# Patient Record
Sex: Female | Born: 1955 | Race: White | Hispanic: No | Marital: Married
Health system: Midwestern US, Academic
[De-identification: ages and names within clinical notes are randomized; demographics above are authoritative.]

---

## 2020-01-19 ENCOUNTER — Encounter: Admit: 2020-01-19 | Discharge: 2020-01-19 | Payer: 59

## 2020-01-20 ENCOUNTER — Encounter: Admit: 2020-01-20 | Discharge: 2020-01-20 | Payer: 59

## 2020-01-20 NOTE — Telephone Encounter
Navigation Intake Assessment Document    Patient Name:  Lindsey Brown  DOB:  1956-11-16  Insurance:   [employer] Falling     Appointment Info:    Future Appointments   Date Time Provider Coffee   01/23/2020  2:00 PM Ander Purpura, MD [facility] Exam     Diagnosis & Reason for Visit:  Lindsey Brown is a 64 year old female presenting with leukocytosis and lymphadenopathy in her neck. She had one FNA on a lymph node in her neck that per her was inconclusive so now ENT has scheduled her for an excisional lymph node biopsy at [facility] for next week. She was also treated with a two week course of doxycycline which did not bring down the size of her lymph nodes. She would like a second opinion on if having an excisional lymph node biopsy is the best next step and has not seen a hem/onc yet and would like hem/onc opinion before moving forward. We have requested the bx  results and will forward once received. Labs are attached to the navigation e-mail. Most recent WBC: 28.6, ALC: 57846    Physician Info:   Referring Physician:  Agustin Cree MD    Contact Name & Number:  [facility] [phone]     Comments:  COVID-19 guidelines reviewed with patient, including: visitor and universal masking policies, and a temperature check at the facility entrance upon arrival.

## 2020-01-23 ENCOUNTER — Encounter: Admit: 2020-01-23 | Discharge: 2020-01-23 | Payer: 59

## 2020-01-23 DIAGNOSIS — D72829 Elevated white blood cell count, unspecified: Secondary | ICD-10-CM

## 2020-01-23 DIAGNOSIS — M1990 Unspecified osteoarthritis, unspecified site: Secondary | ICD-10-CM

## 2020-01-23 NOTE — Progress Notes
Subjective:        HEMATOLOGY/ONCOLOGY CONSULTATION REPORT:    CONSULTATION REQUESTED BY: Jason Fila MD     REASON FOR CONSULTATION: Leukocytosis and left cervical lymphadenopathy.    History of Present Illness  Lindsey Brown is a 64 y.o. female.    Lindsey Brown is a 65 year old female presenting with leukocytosis and lymphadenopathy in her neck since 2019. She had one FNA on a lymph node 01/09/2020 in her neck that per her was inconclusive with evidence of predominantly small lymphocytes raising the possibility of a neoplastic process, so ENT had recommended for an excisional lymph node biopsy at [facility]. She was also treated with a two week course of doxycycline which did not bring down the size of her lymph nodes. She requested a second opinion on if having an excisional lymph node biopsy is the best next step and would like hem/onc opinion before moving forward. Labs are attached to the navigation e-mail. WBC in Jan 2021: 28.6, ALC: 09811.  WBC 28.6, hemoglobin 14.4, platelet count 229 and absolute lymphocyte count 22.3. Peripheral smear revealed leukocytosis, lymphocytosis and atypical lymphocytes.    CBC on 01/02/2020 revealed a WBC of 25.9, hemoglobin 14.3 and a platelet count of 266.    No history of fevers chills or night sweats.  No loss of weight or loss of appetite.  No nosebleeds or gum bleeding.  No hematemesis melena or urticaria.  No hemoptysis or hematuria.    Review of old records indicates that she had leukocytosis on 05/12/2018 with a WBC of 12.3, hemoglobin 14.1 and a platelet count of 231.  Absolute lymphocyte count was 6027.    She established with me on 01/23/2020 at [facility] heme-onc clinic.    Family history: Sister has cutaneous T-cell lymphoma.    Medical History:   Diagnosis Date   ? Arthritis      Surgical History:   Procedure Laterality Date   ? COLONOSCOPY       Family History   Problem Relation Age of Onset   ? Arthritis-rheumatoid Mother    ? Cancer Sister ? Heart Disease Sister    ? Thyroid Disease Sister    ? Asthma Son    ? Diabetes Son      Social History     Socioeconomic History   ? Marital status: Married     Spouse name: Not on file   ? Number of children: Not on file   ? Years of education: Not on file   ? Highest education level: Not on file   Occupational History   ? Not on file   Tobacco Use   ? Smoking status: Former Smoker     Quit date: 1988     Years since quitting: 33.1   ? Smokeless tobacco: Never Used   Substance and Sexual Activity   ? Alcohol use: Yes   ? Drug use: Never   ? Sexual activity: Not on file   Other Topics Concern   ? Not on file   Social History Narrative   ? Not on file              Review of Systems   Constitutional: Negative.    HENT: Negative.    Eyes: Negative.    Respiratory: Negative.    Cardiovascular: Negative.    Gastrointestinal: Negative.    Genitourinary: Negative.    Musculoskeletal: Negative.    Skin: Negative.    Neurological: Negative.    Psychiatric/Behavioral:  Negative.          Objective:         ? ALPRAZolam (XANAX) 0.5 mg tablet Take 0.5 mg by mouth daily.     Vitals:    01/23/20 1345   BP: (!) 143/79   BP Source: Arm, Right Upper   Patient Position: Sitting   Pulse: 88   Resp: 18   Temp: 36.6 ?C (97.8 ?F)   TempSrc: Temporal   SpO2: 99%   Weight: 82.1 kg (181 lb)   Height: 173.2 cm (68.19)   PainSc: Zero     Body mass index is 27.37 kg/m?Marland Kitchen     Physical Exam  Constitutional:       Appearance: She is well-developed.   HENT:      Head: Normocephalic and atraumatic.   Neck:      Musculoskeletal: Neck supple.   Cardiovascular:      Rate and Rhythm: Normal rate.      Heart sounds: Normal heart sounds.   Pulmonary:      Effort: Pulmonary effort is normal.      Breath sounds: Normal breath sounds.   Abdominal:      Palpations: Abdomen is soft.      Tenderness: There is no abdominal tenderness.   Musculoskeletal:         General: No tenderness.   Skin:     General: Skin is warm and dry.   Neurological: Mental Status: She is alert and oriented to person, place, and time.       Lymphatics: Left cervical lymph node measuring 2 cm palpable.  No other lymphadenopathy.  No hepatosplenomegaly.     Assessment and Plan:    1.  Leukocytosis with absolute lymphocytosis and left cervical lymphadenopathy since May 2019.    WBC 12.3, hemoglobin 14.1 and platelet count 231 on 05/12/2018.  WBC 28.6, hemoglobin 14.4 and platelet count 229 on 01/12/2020.    She had one FNA on a lymph node 01/09/2020 in her neck that per her was inconclusive with evidence of predominantly small lymphocytes raising the possibility of a neoplastic process, so ENT had recommended for an excisional lymph node biopsy.    I reviewed the CBC results in detail with the patient.  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  I will proceed with a bone marrow biopsy for confirmation of diagnosis and I will also obtain cytogenetics and fish studies for CLL.  Patient was anxious to know the prognosis and treatment options of CLL and I explained in detail and I also discussed the role of chemotherapy in patients with CLL.  They understand the need for bone marrow biopsy for confirmation of diagnosis.    I will consult interventional radiology at [facility] for bone marrow biopsy.  Patient will return for follow-up in about 2 to 3 weeks to review the results.    2.  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

## 2020-02-06 ENCOUNTER — Encounter: Admit: 2020-02-06 | Discharge: 2020-02-06 | Payer: 59

## 2020-02-06 DIAGNOSIS — C9110 Chronic lymphocytic leukemia of B-cell type not having achieved remission: Secondary | ICD-10-CM

## 2020-02-06 DIAGNOSIS — D72829 Elevated white blood cell count, unspecified: Secondary | ICD-10-CM

## 2020-02-06 NOTE — Progress Notes
Subjective:        REASON FOR VISIT: Leukocytosis and left cervical lymphadenopathy due to CLL.    Obtained patient's verbal consent to treat them and their agreement to [facility] financial policy and NPP via this telehealth visit during the [facility] Emergency    History of Present Illness  Lindsey Brown is a 64 y.o. female.    Lindsey Brown is a 64 year old female presenting with leukocytosis and lymphadenopathy in her neck since 2019. She had one FNA on a lymph node 01/09/2020 in her neck that per her was inconclusive with evidence of predominantly small lymphocytes raising the possibility of a neoplastic process, so ENT had recommended for an excisional lymph node biopsy at [facility]. She was also treated with a two week course of doxycycline which did not bring down the size of her lymph nodes. She requested a second opinion on if having an excisional lymph node biopsy is the best next step and would like hem/onc opinion before moving forward. Labs are attached to the navigation e-mail. WBC in Jan 2021: 28.6, ALC: 16109.  WBC 28.6, hemoglobin 14.4, platelet count 229 and absolute lymphocyte count 22.3. Peripheral smear revealed leukocytosis, lymphocytosis and atypical lymphocytes.    CBC on 01/02/2020 revealed a WBC of 25.9, hemoglobin 14.3 and a platelet count of 266.    No history of fevers chills or night sweats.  No loss of weight or loss of appetite.  No nosebleeds or gum bleeding.  No hematemesis melena or urticaria.  No hemoptysis or hematuria.    Review of old records indicates that she had leukocytosis on 05/12/2018 with a WBC of 12.3, hemoglobin 14.1 and a platelet count of 231.  Absolute lymphocyte count was 6027.    She established with me on 01/23/2020 at [facility] heme-onc clinic.    Bone marrow biopsy performed at [facility] on 01/26/2020 reveals hypercellular marrow with trilineage hematopoiesis and 60% involvement by CLL.  CLL FISH panel and I GVH sequencing has been ordered.  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  Cytogenetics revealed a normal karyotype.    Interval History: No fever chills or night sweats.  No chest pain or shortness of breath.  No nausea vomiting.    Family history: Sister has cutaneous T-cell lymphoma.    Medical History:   Diagnosis Date   ? Arthritis      Surgical History:   Procedure Laterality Date   ? COLONOSCOPY       Family History   Problem Relation Age of Onset   ? Arthritis-rheumatoid Mother    ? Cancer Sister    ? Heart Disease Sister    ? Thyroid Disease Sister    ? Asthma Son    ? Diabetes Son      Social History     Socioeconomic History   ? Marital status: Married     Spouse name: Not on file   ? Number of children: Not on file   ? Years of education: Not on file   ? Highest education level: Not on file   Occupational History   ? Not on file   Tobacco Use   ? Smoking status: Former Smoker     Quit date: 1988     Years since quitting: 33.1   ? Smokeless tobacco: Never Used   Substance and Sexual Activity   ? Alcohol use:  Yes   ? Drug use: Never   ? Sexual activity: Not on file   Other Topics Concern   ? Not on file   Social History Narrative   ? Not on file              Review of Systems   Constitutional: Negative.    HENT: Negative.    Eyes: Negative.    Respiratory: Negative.    Cardiovascular: Negative.    Gastrointestinal: Negative.    Genitourinary: Negative.    Musculoskeletal: Negative.    Skin: Negative.    Neurological: Negative.    Psychiatric/Behavioral: Negative.          Objective:         ? ALPRAZolam (XANAX) 0.5 mg tablet Take 0.5 mg by mouth daily.     There were no vitals filed for this visit.  There is no height or weight on file to calculate BMI.       Physical Exam  Constitutional:       Appearance: She is well-developed.   HENT:      Head: Normocephalic and atraumatic.   Pulmonary: Effort: Pulmonary effort is normal.       Neurological:      Mental Status: She is alert and oriented to person, place, and time.            Assessment and Plan:    1.  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    Bone marrow biopsy performed at [facility] on 01/26/2020 reveals hypercellular marrow with trilineage hematopoiesis and 60% involvement by CLL.  CLL FISH panel and I GVH sequencing has been ordered.  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  Cytogenetics revealed a normal karyotype.    Leukocytosis with absolute lymphocytosis and left cervical lymphadenopathy since May 2019.    WBC 12.3, hemoglobin 14.1 and platelet count 231 on 05/12/2018.  WBC 28.6, hemoglobin 14.4 and platelet count 229 on 01/12/2020.    She had one FNA on a lymph node 01/09/2020 in her neck that per her was inconclusive with evidence of predominantly small lymphocytes raising the possibility of a neoplastic process, so ENT had recommended for an excisional lymph node biopsy.    Discussed in detail prognosis and treatment options of CLL and I explained in detail and I also discussed the role of chemotherapy in patients with CLL.      No criteria for chemo at this time.    Await CLL FISH panel and I GVH sequencing.    She will f/u in 2 weeks.    2.  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    I received a note from patient's ENT physician Dr. Aniceto Boss who discussed with the pathologist again to make sure that this lymph node in the neck is not something different from her manifestation of CLL.  The pathologist confirmed that it is unlikely to be 2 different pathologies and hence agreed to cancel excisional biopsy of the left neck lymph node and proceed with bone marrow biopsy.    Problem   Cll (Chronic Lymphocytic Leukemia) (Hcc) 25 minutes spent on this patient's encounter with counseling and coordination of care taking >50% of the visit.

## 2020-02-16 ENCOUNTER — Encounter: Admit: 2020-02-16 | Discharge: 2020-02-16 | Payer: 59

## 2020-02-16 DIAGNOSIS — C9110 Chronic lymphocytic leukemia of B-cell type not having achieved remission: Secondary | ICD-10-CM

## 2020-02-16 NOTE — Progress Notes
Subjective:          REASON FOR VISIT: Leukocytosis and left cervical lymphadenopathy.    History of Present Illness  Lindsey Brown is a 64 y.o. female.    Lindsey Brown is a 64 year old female presenting with leukocytosis and lymphadenopathy in her neck since 2019. She had one FNA on a lymph node 01/09/2020 in her neck that per her was inconclusive with evidence of predominantly small lymphocytes raising the possibility of a neoplastic process, so ENT had recommended for an excisional lymph node biopsy at [facility]. She was also treated with a two week course of doxycycline which did not bring down the size of her lymph nodes. She requested a second opinion on if having an excisional lymph node biopsy is the best next step and would like hem/onc opinion before moving forward. Labs are attached to the navigation e-mail. WBC in Jan 2021: 28.6, ALC: 98119.  WBC 28.6, hemoglobin 14.4, platelet count 229 and absolute lymphocyte count 22.3. Peripheral smear revealed leukocytosis, lymphocytosis and atypical lymphocytes.    CBC on 01/02/2020 revealed a WBC of 25.9, hemoglobin 14.3 and a platelet count of 266.    No history of fevers chills or night sweats.  No loss of weight or loss of appetite.  No nosebleeds or gum bleeding.  No hematemesis melena or urticaria.  No hemoptysis or hematuria.    Review of old records indicates that she had leukocytosis on 05/12/2018 with a WBC of 12.3, hemoglobin 14.1 and a platelet count of 231.  Absolute lymphocyte count was 6027.    Bone marrow biopsy performed at [facility] on 01/26/2020 reveals hypercellular marrow with trilineage hematopoiesis and 60% involvement by CLL.  CLL FISH panel unremarkable, and I GVH sequencing has been ordered.  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  Cytogenetics revealed a normal karyotype. Interval History: No fever chills or night sweats.  No chest pain or shortness of breath.  No nausea vomiting.    She established with me on 01/23/2020 at [facility] heme-onc clinic.    Family history: Sister has cutaneous T-cell lymphoma.    Medical History:   Diagnosis Date   ? Arthritis      Surgical History:   Procedure Laterality Date   ? COLONOSCOPY       Family History   Problem Relation Age of Onset   ? Arthritis-rheumatoid Mother    ? Cancer Sister    ? Heart Disease Sister    ? Thyroid Disease Sister    ? Asthma Son    ? Diabetes Son      Social History     Socioeconomic History   ? Marital status: Married     Spouse name: Not on file   ? Number of children: Not on file   ? Years of education: Not on file   ? Highest education level: Not on file   Occupational History   ? Not on file   Tobacco Use   ? Smoking status: Former Smoker     Quit date: 1988     Years since quitting: 33.1   ? Smokeless tobacco: Never Used   Substance and Sexual Activity   ? Alcohol use: Yes   ? Drug use: Never   ? Sexual activity: Not on file   Other Topics Concern   ? Not on file   Social History Narrative   ?  Not on file              Review of Systems   Constitutional: Negative.    HENT: Negative.    Eyes: Negative.    Respiratory: Negative.    Cardiovascular: Negative.    Gastrointestinal: Negative.    Genitourinary: Negative.    Musculoskeletal: Negative.    Skin: Negative.    Neurological: Negative.    Psychiatric/Behavioral: Negative.          Objective:         ? ALPRAZolam (XANAX) 0.5 mg tablet Take 0.5 mg by mouth daily.     Vitals:    02/16/20 0934   BP: 133/77   BP Source: Arm, Right Upper   Patient Position: Sitting   Pulse: 80   Resp: 18   Temp: 36.8 ?C (98.3 ?F)   TempSrc: Temporal   SpO2: 100%   Weight: 82.6 kg (182 lb)   Height: 173.2 cm (68.19)   PainSc: Zero     Body mass index is 27.52 kg/m?Marland Kitchen     Physical Exam  Constitutional:       Appearance: She is well-developed.   HENT:      Head: Normocephalic and atraumatic. Neck:      Musculoskeletal: Neck supple.   Cardiovascular:      Rate and Rhythm: Normal rate.      Heart sounds: Normal heart sounds.   Pulmonary:      Effort: Pulmonary effort is normal.      Breath sounds: Normal breath sounds.   Abdominal:      Palpations: Abdomen is soft.      Tenderness: There is no abdominal tenderness.   Musculoskeletal:         General: No tenderness.   Skin:     General: Skin is warm and dry.   Neurological:      Mental Status: She is alert and oriented to person, place, and time.       Lymphatics: Left cervical lymph node measuring 2 cm palpable.  No other lymphadenopathy.  No hepatosplenomegaly.     Assessment and Plan:    1.  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    Bone marrow biopsy performed at [facility] on 01/26/2020 reveals hypercellular marrow with trilineage hematopoiesis and 60% involvement by CLL.  CLL FISH panel and I GVH sequencing has been ordered.  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  Cytogenetics revealed a normal karyotype.  Fish studies on the bone marrow is negative for deletion of MYB, ATM, TP53, RB1, 13q14.3, and lamp 1, for trisomy 12, IGH?CCND1 (11;14) fusion, and for IGH rearrangement.  IGVH sequencing pending     Leukocytosis with absolute lymphocytosis and left cervical lymphadenopathy since May 2019.    WBC 12.3, hemoglobin 14.1 and platelet count 231 on 05/12/2018.  WBC 28.6, hemoglobin 14.4 and platelet count 229 on 01/12/2020.  WBC 27.0, hemoglobin 13.9 and platelet count 237 on 01/26/2020.    She had one FNA on a lymph node 01/09/2020 in her neck that per her was inconclusive with evidence of predominantly small lymphocytes raising the possibility of a neoplastic process, so ENT had recommended for an excisional lymph node biopsy.    Discussed in detail prognosis and treatment options of CLL and I explained in detail and I also discussed the role of chemotherapy in patients with CLL.  No criteria for chemo at this time.  Await beta-2 microglobulin levels and IgGVH sequencing for prognostic classification for the CLL IPI score.    I again discussed in detail with the patient and her husband regarding the diagnosis prognosis and indications for treatment.  There is no indications for chemotherapy at this point.  I have recommended labs to be checked every 3 months.    CLL-IPI???The International CLL-IPI working group has proposed an international prognostic index for patients with CLL, which incorporates data regarding disease stage, biology, and patient-related factors. This staging system may help to assign patients with higher or lower risk CLL to particular interventions in clinical trials in order to get homogeneous study groups.  The scoring system was developed and validated using data from [id number] previously untreated patients enrolled on one of eight phase 3 clinical trials completed between 1997 and 2007 [123]. Five factors were identified as independent predictors of short survival on multivariate analysis and assigned a weighted score:  ?Del(17p) or TP53 mutation ? 4 points  ?Serum B2M >3.5 mg/L ? 2 points   ?Unmutated IGHV ? 2 points  ?Rai stage I to IV (table 1) or Binet stage B or C (table 2) ? 1 point  ?Age >65 years ? 1 point  Analysis of these five factors resulted in a final prognostic score, which was able to identify four risk groups with significantly different estimated rates of OS in the internal validation dataset:  ?Low risk (0 to 1 points) ? 91 and 87 percent five- and ten-year OS; median not reached  ?Intermediate risk (2 to 3 points) ? 80 and 40 percent five- and ten-year OS; median 104 months  ?High risk (4 to 6 points) ? 53 and 16 percent five- and ten-year OS; median 63 months  ?Very high risk (7 to 10 points) ? 19 and 0 percent five- and ten-year OS; median 31 months The type of first-line treatment was not identified as an independent factor in this analysis; however, the studies did not include treatment with novel oral inhibitors (eg, idelalisib, ibrutinib, venetoclax), which have demonstrated activity in patients with del(17p) or TP53 mutations. The ability of this scoring system to prognosticate OS and time to first treatment has also been validated in independent cohorts of patients with newly diagnosed and previously untreated patients with CLL       2.  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    I received a note from patient's ENT physician Dr. Aniceto Boss who discussed with the pathologist again to make sure that this lymph node in the neck is not something different from her manifestation of CLL.  The pathologist confirmed that it is unlikely to be 2 different pathologies and hence agreed to cancel excisional biopsy of the left neck lymph node and proceed with bone marrow biopsy.    3.  She had questions about vaccinations.  I advised her that CLL would put her at a higher risk of infections and I would encourage her to stay up-to-date on all the necessary vaccinations including Shingrix vaccine for which she will follow-up with her primary care physician.

## 2020-02-17 LAB — CBC AND DIFF
Lab: 0 % (ref >60)
Lab: 0 10*3/uL (ref 0–0.20)
Lab: 0.1 10*3/uL (ref 0–0.45)
Lab: 0.5 10*3/uL (ref 0–0.80)
Lab: 1 % (ref 0–5)
Lab: 13 % (ref 11–15)
Lab: 14 g/dL — ABNORMAL HIGH (ref 12.0–15.0)
Lab: 15 % — ABNORMAL LOW (ref 41–77)
Lab: 2 % — ABNORMAL LOW (ref 4–12)
Lab: 20 10*3/uL — ABNORMAL HIGH (ref 1.0–4.8)
Lab: 228 10*3/uL (ref 150–400)
Lab: 24 10*3/uL — ABNORMAL HIGH (ref 4.5–11.0)
Lab: 3.8 10*3/uL (ref >60)
Lab: 30 pg (ref 26–34)
Lab: 32 g/dL (ref 32.0–36.0)
Lab: 4.5 M/UL (ref 4.0–5.0)
Lab: 43 % (ref 36–45)
Lab: 8.6 FL (ref 7–11)
Lab: 82 % — ABNORMAL HIGH (ref 24–44)
Lab: 94 FL (ref 80–100)

## 2020-02-17 LAB — COMPREHENSIVE METABOLIC PANEL: Lab: 142 MMOL/L (ref 137–147)

## 2020-02-23 ENCOUNTER — Encounter: Admit: 2020-02-23 | Discharge: 2020-02-23 | Payer: 59

## 2020-02-23 DIAGNOSIS — C9110 Chronic lymphocytic leukemia of B-cell type not having achieved remission: Secondary | ICD-10-CM

## 2020-02-23 NOTE — Progress Notes
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    Obtained patient's verbal consent to treat them and their agreement to [facility] financial policy and NPP via this telehealth visit during the [facility]    History of Present Illness  Lindsey Brown is a 64 y.o. female.    Lindsey Brown is a 64 year old female presenting with leukocytosis and lymphadenopathy in her neck since 2019. She had one FNA on a lymph node 01/09/2020 in her neck that per her was inconclusive with evidence of predominantly small lymphocytes raising the possibility of a neoplastic process, so ENT had recommended for an excisional lymph node biopsy at [facility]. She was also treated with a two week course of doxycycline which did not bring down the size of her lymph nodes. She requested a second opinion on if having an excisional lymph node biopsy is the best next step and would like hem/onc opinion before moving forward. Labs are attached to the navigation e-mail. WBC in Jan 2021: 28.6, ALC: 64403.  WBC 28.6, hemoglobin 14.4, platelet count 229 and absolute lymphocyte count 22.3. Peripheral smear revealed leukocytosis, lymphocytosis and atypical lymphocytes.    CBC on 01/02/2020 revealed a WBC of 25.9, hemoglobin 14.3 and a platelet count of 266.    No history of fevers chills or night sweats.  No loss of weight or loss of appetite.  No nosebleeds or gum bleeding.  No hematemesis melena or urticaria.  No hemoptysis or hematuria.    Review of old records indicates that she had leukocytosis on 05/12/2018 with a WBC of 12.3, hemoglobin 14.1 and a platelet count of 231.  Absolute lymphocyte count was 6027.    Bone marrow biopsy performed at [facility] on 01/26/2020 reveals hypercellular marrow with trilineage hematopoiesis and 60% involvement by CLL.  CLL FISH panel unremarkable, and IGVH mutated.  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  Cytogenetics revealed a normal karyotype.    Interval History: No fever chills or night sweats.  No chest pain or shortness of breath.  No nausea vomiting.    She established with me on 01/23/2020 at [facility] heme-onc clinic.    Family history: Sister has cutaneous T-cell lymphoma.    Medical History:   Diagnosis Date   ? Arthritis      Surgical History:   Procedure Laterality Date   ? COLONOSCOPY       Family History   Problem Relation Age of Onset   ? Arthritis-rheumatoid Mother    ? Cancer Sister    ? Heart Disease Sister    ? Thyroid Disease Sister    ? Asthma Son    ? Diabetes Son      Social History     Socioeconomic History   ? Marital status: Married     Spouse name: Not on file   ? Number of children: Not on file   ? Years of education: Not on file   ? Highest education level: Not on file   Occupational History   ? Not on file   Tobacco Use   ? Smoking status: Former Smoker     Quit date: 1988     Years since quitting: 33.1   ? Smokeless tobacco: Never Used   Substance and Sexual Activity   ? Alcohol use: Yes   ?  Drug use: Never   ? Sexual activity: Not on file   Other Topics Concern   ? Not on file   Social History Narrative   ? Not on file              Review of Systems   Constitutional: Negative.    HENT: Negative.    Eyes: Negative.    Respiratory: Negative.    Cardiovascular: Negative.    Gastrointestinal: Negative.    Genitourinary: Negative.    Musculoskeletal: Negative.    Skin: Negative.    Neurological: Negative.    Psychiatric/Behavioral: Negative.          Objective:         ? ALPRAZolam (XANAX) 0.5 mg tablet Take 0.5 mg by mouth daily.     There were no vitals filed for this visit.  There is no height or weight on file to calculate BMI.     Physical Exam  Constitutional:       Appearance: She is well-developed.   HENT:      Head: Normocephalic and atraumatic.   Neck: Musculoskeletal: Neck supple.   Cardiovascular:      Rate and Rhythm: Normal rate.      Heart sounds: Normal heart sounds.   Pulmonary:      Effort: Pulmonary effort is normal.      Breath sounds: Normal breath sounds.   Abdominal:      Palpations: Abdomen is soft.      Tenderness: There is no abdominal tenderness.   Musculoskeletal:         General: No tenderness.   Skin:     General: Skin is warm and dry.   Neurological:      Mental Status: She is alert and oriented to person, place, and time.       Lymphatics: Left cervical lymph node measuring 2 cm palpable.  No other lymphadenopathy.  No hepatosplenomegaly.     Assessment and Plan:    1.  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  CLL-IPI Low risk: 91 and 87 percent five- and ten-year OS; median not reached.    Bone marrow biopsy performed at [facility] on 01/26/2020 reveals hypercellular marrow with trilineage hematopoiesis and 60% involvement by CLL.  CLL FISH panel and I GVH sequencing has been ordered.  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  Cytogenetics revealed a normal karyotype.  Fish studies on the bone marrow is negative for deletion of MYB, ATM, TP53, RB1, 13q14.3, and lamp 1, for trisomy 12, IGH?CCND1 (11;14) fusion, and for IGH rearrangement.  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  B2 microglobulin 2.5 on 02/16/2020.     Leukocytosis with absolute lymphocytosis and left cervical lymphadenopathy since May 2019.    WBC 12.3, hemoglobin 14.1 and platelet count 231 on 05/12/2018.  WBC 28.6, hemoglobin 14.4 and platelet count 229 on 01/12/2020.  WBC 27.0, hemoglobin 13.9 and platelet count 237 on 01/26/2020.  WBC 24.6 on 02/16/2020.  Hemoglobin 14.1 and platelet count 228.    She had one FNA on a lymph node 01/09/2020 in her neck that per her was inconclusive with evidence of predominantly small lymphocytes raising the possibility of a neoplastic process, so ENT had recommended for an excisional lymph node biopsy.    Discussed in detail prognosis and treatment options of CLL and I explained in detail and I also discussed the role  of chemotherapy in patients with CLL.      CLL-IPI???The International CLL-IPI working group has proposed an international prognostic index for patients with CLL, which incorporates data regarding disease stage, biology, and patient-related factors. This staging system may help to assign patients with higher or lower risk CLL to particular interventions in clinical trials in order to get homogeneous study groups.  The scoring system was developed and validated using data from [id number] previously untreated patients enrolled on one of eight phase 3 clinical trials completed between 1997 and 2007 [123]. Five factors were identified as independent predictors of short survival on multivariate analysis and assigned a weighted score:  ?Del(17p) or TP53 mutation ? 4 points  ?Serum B2M >3.5 mg/L ? 2 points   ?Unmutated IGHV ? 2 points  ?Rai stage I to IV (table 1) or Binet stage B or C (table 2) ? 1 point  ?Age >65 years ? 1 point  Analysis of these five factors resulted in a final prognostic score, which was able to identify four risk groups with significantly different estimated rates of OS in the internal validation dataset:  ?Low risk (0 to 1 points) ? 91 and 87 percent five- and ten-year OS; median not reached  ?Intermediate risk (2 to 3 points) ? 80 and 40 percent five- and ten-year OS; median 104 months  ?High risk (4 to 6 points) ? 53 and 16 percent five- and ten-year OS; median 63 months  ?Very high risk (7 to 10 points) ? 19 and 0 percent five- and ten-year OS; median 31 months  The type of first-line treatment was not identified as an independent factor in this analysis; however, the studies did not include treatment with novel oral inhibitors (eg, idelalisib, ibrutinib, venetoclax), which have demonstrated activity in patients with del(17p) or TP53 mutations. The ability of this scoring system to prognosticate OS and time to first treatment has also been validated in independent cohorts of patients with newly diagnosed and previously untreated patients with CLL     I reviewed the CLL?IPI prognostic group and I also discussed the survival data as above.  She falls in the low risk category with excellent prognosis.  I also reviewed the criteria for treatment and there is no indication for treatment at this point.  Plan for follow-up in 3 months.    2.  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    I received a note from patient's ENT physician Dr. Aniceto Boss who discussed with the pathologist again to make sure that this lymph node in the neck is not something different from her manifestation of CLL.  The pathologist confirmed that it is unlikely to be 2 different pathologies and hence agreed to cancel excisional biopsy of the left neck lymph node and proceed with bone marrow biopsy.    3.  She had questions about vaccinations.  I advised her that CLL would put her at a higher risk of infections and I would encourage her to stay up-to-date on all the necessary vaccinations including Shingrix vaccine for which she will follow-up with her primary care physician.    CBC w diff  CBC with Diff Latest Ref Rng & Units 02/16/2020   WBC 4.5 - 11.0 K/UL 24.6(H)   RBC 4.0 - 5.0 M/UL 4.57   HGB 12.0 - 15.0 GM/DL 62.9   HCT 36 - 45 % 52.8   MCV 80 - 100 FL 94.2   MCH 26 - 34 PG 30.9   MCHC 32.0 -  36.0 G/DL 16.1   RDW 11 - 15 % 09.6   PLT 150 - 400 K/UL 228   MPV 7 - 11 FL 8.6   NEUT 41 - 77 % 15(L)   ANC 1.8 - 7.0 K/UL 3.80   LYMA 24 - 44 % 82(H)   ALYM 1.0 - 4.8 K/UL 20.04(H)   MONA 4 - 12 % 2(L)   AMONO 0 - 0.80 K/UL 0.55   EOSA 0 - 5 % 1   AEOS 0 - 0.45 K/UL 0.17   BASA 0 - 2 % 0   ABAS 0 - 0.20 K/UL 0.06                            25 min

## 2020-05-10 ENCOUNTER — Encounter: Admit: 2020-05-10 | Discharge: 2020-05-10 | Payer: 59

## 2020-05-10 DIAGNOSIS — C9110 Chronic lymphocytic leukemia of B-cell type not having achieved remission: Secondary | ICD-10-CM

## 2020-05-10 LAB — CBC AND DIFF

## 2020-05-17 ENCOUNTER — Encounter: Admit: 2020-05-17 | Discharge: 2020-05-17 | Payer: 59

## 2020-05-17 DIAGNOSIS — C9110 Chronic lymphocytic leukemia of B-cell type not having achieved remission: Secondary | ICD-10-CM

## 2020-05-17 DIAGNOSIS — M1990 Unspecified osteoarthritis, unspecified site: Secondary | ICD-10-CM

## 2020-05-17 NOTE — Progress Notes
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    History of Present Illness  Lindsey Brown is a 64 y.o. female.    Lindsey Brown is a 64 year old female presenting with leukocytosis and lymphadenopathy in her neck since 2019. She had one FNA on a lymph node 01/09/2020 in her neck that per her was inconclusive with evidence of predominantly small lymphocytes raising the possibility of a neoplastic process, so ENT had recommended for an excisional lymph node biopsy at [facility]. She was also treated with a two week course of doxycycline which did not bring down the size of her lymph nodes. She requested a second opinion on if having an excisional lymph node biopsy is the best next step and would like hem/onc opinion before moving forward. Labs are attached to the navigation e-mail. WBC in Jan 2021: 28.6, ALC: 16109.  WBC 28.6, hemoglobin 14.4, platelet count 229 and absolute lymphocyte count 22.3. Peripheral smear revealed leukocytosis, lymphocytosis and atypical lymphocytes.    CBC on 01/02/2020 revealed a WBC of 25.9, hemoglobin 14.3 and a platelet count of 266.    No history of fevers chills or night sweats.  No loss of weight or loss of appetite.  No nosebleeds or gum bleeding.  No hematemesis melena or urticaria.  No hemoptysis or hematuria.    Review of old records indicates that she had leukocytosis on 05/12/2018 with a WBC of 12.3, hemoglobin 14.1 and a platelet count of 231.  Absolute lymphocyte count was 6027.    Bone marrow biopsy performed at [facility] on 01/26/2020 reveals hypercellular marrow with trilineage hematopoiesis and 60% involvement by CLL.  CLL FISH panel unremarkable, and I GVH sequencing has been ordered.  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  Cytogenetics revealed a normal karyotype. Interval History: No fever chills or night sweats.  No chest pain or shortness of breath.  No nausea vomiting.    She established with me on 01/23/2020 at [facility] heme-onc clinic.    Family history: Sister has cutaneous T-cell lymphoma.    Medical History:   Diagnosis Date   ? Arthritis      Surgical History:   Procedure Laterality Date   ? COLONOSCOPY       Family History   Problem Relation Age of Onset   ? Arthritis-rheumatoid Mother    ? Cancer Sister    ? Heart Disease Sister    ? Thyroid Disease Sister    ? Asthma Son    ? Diabetes Son      Social History     Socioeconomic History   ? Marital status: Married     Spouse name: Not on file   ? Number of children: Not on file   ? Years of education: Not on file   ? Highest education level: Not on file   Occupational History   ? Not on file   Tobacco Use   ? Smoking status: Former Smoker     Quit date: 1988     Years since quitting: 33.4   ? Smokeless tobacco: Never Used   Substance and Sexual Activity   ? Alcohol use: Yes   ? Drug use: Never   ? Sexual activity: Not on file   Other Topics Concern   ? Not on file   Social  History Narrative   ? Not on file              Review of Systems   Constitutional: Negative.    HENT: Negative.    Eyes: Negative.    Respiratory: Negative.    Cardiovascular: Negative.    Gastrointestinal: Negative.    Genitourinary: Negative.    Musculoskeletal: Negative.    Skin: Negative.    Neurological: Negative.    Psychiatric/Behavioral: Negative.          Objective:         ? ALPRAZolam (XANAX) 0.5 mg tablet Take 0.5 mg by mouth daily.     Vitals:    05/17/20 1300   BP: 138/81   BP Source: Arm, Right Upper   Patient Position: Sitting   Pulse: 78   Resp: 18   Temp: 36.4 ?C (97.5 ?F)   TempSrc: Temporal   SpO2: 98%   Weight: 78.6 kg (173 lb 3.2 oz)   Height: 173 cm (68.11)   PainSc: Zero     Body mass index is 26.25 kg/m?Marland Kitchen     Physical Exam  Constitutional:       Appearance: She is well-developed.   HENT:      Head: Normocephalic and atraumatic. Cardiovascular:      Rate and Rhythm: Normal rate.      Heart sounds: Normal heart sounds.   Pulmonary:      Effort: Pulmonary effort is normal.      Breath sounds: Normal breath sounds.   Abdominal:      Palpations: Abdomen is soft.      Tenderness: There is no abdominal tenderness.   Musculoskeletal:         General: No tenderness.      Cervical back: Neck supple.   Skin:     General: Skin is warm and dry.   Neurological:      Mental Status: She is alert and oriented to person, place, and time.       Lymphatics: Left cervical lymph node measuring 2 cm palpable.  No other lymphadenopathy.  No hepatosplenomegaly.     Assessment and Plan:    1.  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  CLL-IPI Low risk: 91 and 87 percent five- and ten-year OS; median not reached.    Bone marrow biopsy performed at [facility] on 01/26/2020 reveals hypercellular marrow with trilineage hematopoiesis and 60% involvement by CLL.  CLL FISH panel and I GVH sequencing has been ordered.  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  Cytogenetics revealed a normal karyotype.  Fish studies on the bone marrow is negative for deletion of MYB, ATM, TP53, RB1, 13q14.3, and lamp 1, for trisomy 12, IGH?CCND1 (11;14) fusion, and for IGH rearrangement.  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  B2 microglobulin 2.5 on 02/16/2020.     Leukocytosis with absolute lymphocytosis and left cervical lymphadenopathy since May 2019.    WBC 12.3, hemoglobin 14.1 and platelet count 231 on 05/12/2018.  WBC 28.6, hemoglobin 14.4 and platelet count 229 on 01/12/2020.  WBC 27.0, hemoglobin 13.9 and platelet count 237 on 01/26/2020.  WBC 24.6 on 02/16/2020.  Hemoglobin 14.1 and platelet count 228.  CBC on 05/10/2020 reveals hemoglobin 13.6, WBC 27.4 and platelet count of 191.  Absolute lymphocyte count is 19.2.    She had one FNA on a lymph node 01/09/2020 in her neck that  per her was inconclusive with evidence of predominantly small lymphocytes raising the possibility of a neoplastic process, so ENT had recommended for an excisional lymph node biopsy.    Discussed in detail prognosis and treatment options of CLL and I explained in detail and I also discussed the role of chemotherapy in patients with CLL.      CLL-IPI???The International CLL-IPI working group has proposed an international prognostic index for patients with CLL, which incorporates data regarding disease stage, biology, and patient-related factors. This staging system may help to assign patients with higher or lower risk CLL to particular interventions in clinical trials in order to get homogeneous study groups.  The scoring system was developed and validated using data from [id number] previously untreated patients enrolled on one of eight phase 3 clinical trials completed between 1997 and 2007 [123]. Five factors were identified as independent predictors of short survival on multivariate analysis and assigned a weighted score:  ?Del(17p) or TP53 mutation ? 4 points  ?Serum B2M >3.5 mg/L ? 2 points   ?Unmutated IGHV ? 2 points  ?Rai stage I to IV (table 1) or Binet stage B or C (table 2) ? 1 point  ?Age >65 years ? 1 point  Analysis of these five factors resulted in a final prognostic score, which was able to identify four risk groups with significantly different estimated rates of OS in the internal validation dataset:  ?Low risk (0 to 1 points) ? 91 and 87 percent five- and ten-year OS; median not reached  ?Intermediate risk (2 to 3 points) ? 80 and 40 percent five- and ten-year OS; median 104 months  ?High risk (4 to 6 points) ? 53 and 16 percent five- and ten-year OS; median 63 months  ?Very high risk (7 to 10 points) ? 19 and 0 percent five- and ten-year OS; median 31 months  The type of first-line treatment was not identified as an independent factor in this analysis; however, the studies did not include treatment with novel oral inhibitors (eg, idelalisib, ibrutinib, venetoclax), which have demonstrated activity in patients with del(17p) or TP53 mutations. The ability of this scoring system to prognosticate OS and time to first treatment has also been validated in independent cohorts of patients with newly diagnosed and previously untreated patients with CLL     I reviewed the CLL?IPI prognostic group and I also discussed the survival data as above.  She falls in the low risk category with excellent prognosis.  I also reviewed the criteria for treatment and there is no indication for treatment at this point.      Labs from 05/10/2020 revealed that the WBC is slightly worse.  Hemoglobin and platelet counts are normal.  She does not meet the criteria for treatment.  Continue to monitor.  I will check CBC in 3 months.    2.  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    I received a note from patient's ENT physician Dr. Aniceto Boss who discussed with the pathologist again to make sure that this lymph node in the neck is not something different from her manifestation of CLL.  The pathologist confirmed that it is unlikely to be 2 different pathologies and hence agreed to cancel excisional biopsy of the left neck lymph node and proceed with bone marrow biopsy.    3.  She had questions about vaccinations.  I advised her that CLL would put her at a higher risk of infections and I would encourage her to stay up-to-date  on all the necessary vaccinations including Shingrix vaccine for which she will follow-up with her primary care physician.    CBC w diff  CBC with Diff Latest Ref Rng & Units 02/16/2020   WBC 4.5 - 11.0 K/UL 24.6(H)   RBC 4.0 - 5.0 M/UL 4.57   HGB 12.0 - 15.0 GM/DL 04.5   HCT 36 - 45 % 40.9   MCV 80 - 100 FL 94.2   MCH 26 - 34 PG 30.9   MCHC 32.0 - 36.0 G/DL 81.1   RDW 11 - 15 % 91.4   PLT 150 - 400 K/UL 228   MPV 7 - 11 FL 8.6   NEUT 41 - 77 % 15(L)   ANC 1.8 - 7.0 K/UL 3.80   LYMA 24 - 44 % 82(H)   ALYM 1.0 - 4.8 K/UL 20.04(H)   MONA 4 - 12 % 2(L)   AMONO 0 - 0.80 K/UL 0.55   EOSA 0 - 5 % 1   AEOS 0 - 0.45 K/UL 0.17   BASA 0 - 2 % 0   ABAS 0 - 0.20 K/UL 0.06

## 2020-08-02 ENCOUNTER — Encounter: Admit: 2020-08-02 | Discharge: 2020-08-02 | Payer: 59

## 2020-08-02 DIAGNOSIS — C9110 Chronic lymphocytic leukemia of B-cell type not having achieved remission: Secondary | ICD-10-CM

## 2020-08-09 ENCOUNTER — Encounter: Admit: 2020-08-09 | Discharge: 2020-08-09 | Payer: 59

## 2020-08-09 DIAGNOSIS — C9110 Chronic lymphocytic leukemia of B-cell type not having achieved remission: Secondary | ICD-10-CM

## 2020-08-10 LAB — CBC AND DIFF
Lab: 0 % (ref 0–2)
Lab: 0 10*3/uL (ref 0–0.20)
Lab: 0.1 10*3/uL (ref 0–0.45)
Lab: 0.6 10*3/uL (ref 0–0.80)
Lab: 1 % — ABNORMAL HIGH (ref 0–5)
Lab: 13 % (ref 11–15)
Lab: 14 g/dL (ref 12.0–15.0)
Lab: 15 % — ABNORMAL LOW (ref 41–77)
Lab: 195 K/UL (ref 150–400)
Lab: 2 % — ABNORMAL LOW (ref >60)
Lab: 22 K/UL — ABNORMAL HIGH (ref 1.0–4.8)
Lab: 27 10*3/uL — ABNORMAL HIGH (ref 4.5–11.0)
Lab: 32 pg (ref 26–34)
Lab: 33 g/dL (ref 32.0–36.0)
Lab: 4 K/UL (ref 1.8–7.0)
Lab: 4.3 M/UL (ref 4.0–5.0)
Lab: 41 % (ref 36–45)
Lab: 8.7 FL (ref 7–11)
Lab: 82 % — ABNORMAL HIGH (ref >60)
Lab: 95 FL (ref 80–100)

## 2020-08-16 ENCOUNTER — Encounter: Admit: 2020-08-16 | Discharge: 2020-08-16 | Payer: 59

## 2020-08-16 DIAGNOSIS — M1990 Unspecified osteoarthritis, unspecified site: Secondary | ICD-10-CM

## 2020-08-16 DIAGNOSIS — C9110 Chronic lymphocytic leukemia of B-cell type not having achieved remission: Secondary | ICD-10-CM

## 2020-08-16 NOTE — Progress Notes
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    History of Present Illness  Lindsey Brown is a 64 y.o. female.    Lindsey Brown is a 64 year old female presenting with leukocytosis and lymphadenopathy in her neck since 2019. She had one FNA on a lymph node 01/09/2020 in her neck that per her was inconclusive with evidence of predominantly small lymphocytes raising the possibility of a neoplastic process, so ENT had recommended for an excisional lymph node biopsy at [facility]. She was also treated with a two week course of doxycycline which did not bring down the size of her lymph nodes. She requested a second opinion on if having an excisional lymph node biopsy is the best next step and would like hem/onc opinion before moving forward. Labs are attached to the navigation e-mail. WBC in Jan 2021: 28.6, ALC: 16109.  WBC 28.6, hemoglobin 14.4, platelet count 229 and absolute lymphocyte count 22.3. Peripheral smear revealed leukocytosis, lymphocytosis and atypical lymphocytes.    CBC on 01/02/2020 revealed a WBC of 25.9, hemoglobin 14.3 and a platelet count of 266.    No history of fevers chills or night sweats.  No loss of weight or loss of appetite.  No nosebleeds or gum bleeding.  No hematemesis melena or urticaria.  No hemoptysis or hematuria.    Review of old records indicates that she had leukocytosis on 05/12/2018 with a WBC of 12.3, hemoglobin 14.1 and a platelet count of 231.  Absolute lymphocyte count was 6027.    Bone marrow biopsy performed at [facility] on 01/26/2020 reveals hypercellular marrow with trilineage hematopoiesis and 60% involvement by CLL.  CLL FISH panel unremarkable, and I GVH sequencing has been ordered.  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  Cytogenetics revealed a normal karyotype.    Interval History: No fever chills or night sweats.  No chest pain or shortness of breath.  No nausea vomiting.    She established with me on 01/23/2020 at [facility] heme-onc clinic.    Family history: Sister has cutaneous T-cell lymphoma.    Medical History:   Diagnosis Date   ? Arthritis      Surgical History:   Procedure Laterality Date   ? COLONOSCOPY       Family History   Problem Relation Age of Onset   ? Arthritis-rheumatoid Mother    ? Cancer Sister    ? Heart Disease Sister    ? Thyroid Disease Sister    ? Asthma Son    ? Diabetes Son      Social History     Socioeconomic History   ? Marital status: Married     Spouse name: Not on file   ? Number of children: Not on file   ? Years of education: Not on file   ? Highest education level: Not on file   Occupational History   ? Not on file   Tobacco Use   ? Smoking status: Former Smoker     Quit date: 1988     Years since quitting: 33.6   ? Smokeless tobacco: Never Used   Substance and Sexual Activity   ? Alcohol use: Yes   ? Drug use: Never   ? Sexual activity: Not on file   Other Topics Concern   ? Not on file  Social History Narrative   ? Not on file              Review of Systems   Constitutional: Negative.    HENT: Negative.    Eyes: Negative.    Respiratory: Negative.    Cardiovascular: Negative.    Gastrointestinal: Negative.    Genitourinary: Negative.    Musculoskeletal: Negative.    Skin: Negative.    Neurological: Negative.    Psychiatric/Behavioral: Negative.          Objective:         ? ALPRAZolam (XANAX) 0.5 mg tablet Take 0.5 mg by mouth daily.     Vitals:    08/16/20 1445   BP: 127/75   BP Source: Arm, Right Upper   Patient Position: Sitting   Pulse: 85   Resp: 18   Temp: 36.3 ?C (97.3 ?F)   TempSrc: Temporal   SpO2: 96%   Weight: 78.8 kg (173 lb 12.8 oz)   Height: 173 cm (68.11)   PainSc: Zero     Body mass index is 26.34 kg/m?Marland Kitchen     Physical Exam  Constitutional:       Appearance: She is well-developed.   HENT:      Head: Normocephalic and atraumatic.   Cardiovascular:      Rate and Rhythm: Normal rate.      Heart sounds: Normal heart sounds.   Pulmonary:      Effort: Pulmonary effort is normal.      Breath sounds: Normal breath sounds.   Abdominal:      Palpations: Abdomen is soft.      Tenderness: There is no abdominal tenderness.   Musculoskeletal:         General: No tenderness.      Cervical back: Neck supple.   Skin:     General: Skin is warm and dry.   Neurological:      Mental Status: She is alert and oriented to person, place, and time.       Lymphatics: Left cervical lymph node measuring 2 cm palpable.  No other lymphadenopathy.  No hepatosplenomegaly.     Assessment and Plan:    1.  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  CLL-IPI Low risk: 91 and 87 percent five- and ten-year OS; median not reached.    Bone marrow biopsy performed at [facility] on 01/26/2020 reveals hypercellular marrow with trilineage hematopoiesis and 60% involvement by CLL.  CLL FISH panel and I GVH sequencing has been ordered.  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  Cytogenetics revealed a normal karyotype.  Fish studies on the bone marrow is negative for deletion of MYB, ATM, TP53, RB1, 13q14.3, and lamp 1, for trisomy 12, IGH?CCND1 (11;14) fusion, and for IGH rearrangement.  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  B2 microglobulin 2.5 on 02/16/2020.     Leukocytosis with absolute lymphocytosis and left cervical lymphadenopathy since May 2019.    WBC 12.3, hemoglobin 14.1 and platelet count 231 on 05/12/2018.  WBC 28.6, hemoglobin 14.4 and platelet count 229 on 01/12/2020.  WBC 27.0, hemoglobin 13.9 and platelet count 237 on 01/26/2020.  WBC 24.6 on 02/16/2020.  Hemoglobin 14.1 and platelet count 228.  CBC on 05/10/2020 reveals hemoglobin 13.6, WBC 27.4 and platelet count of 191.  Absolute lymphocyte count is 19.2.    She had one FNA on a lymph node 01/09/2020 in  her neck that per her was inconclusive with evidence of predominantly small lymphocytes raising the possibility of a neoplastic process, so ENT had recommended for an excisional lymph node biopsy.    Discussed in detail prognosis and treatment options of CLL and I explained in detail and I also discussed the role of chemotherapy in patients with CLL.      CLL-IPI???The International CLL-IPI working group has proposed an international prognostic index for patients with CLL, which incorporates data regarding disease stage, biology, and patient-related factors. This staging system may help to assign patients with higher or lower risk CLL to particular interventions in clinical trials in order to get homogeneous study groups.  The scoring system was developed and validated using data from [id number] previously untreated patients enrolled on one of eight phase 3 clinical trials completed between 1997 and 2007 [123]. Five factors were identified as independent predictors of short survival on multivariate analysis and assigned a weighted score:  ?Del(17p) or TP53 mutation ? 4 points  ?Serum B2M >3.5 mg/L ? 2 points   ?Unmutated IGHV ? 2 points  ?Rai stage I to IV (table 1) or Binet stage B or C (table 2) ? 1 point  ?Age >65 years ? 1 point  Analysis of these five factors resulted in a final prognostic score, which was able to identify four risk groups with significantly different estimated rates of OS in the internal validation dataset:  ?Low risk (0 to 1 points) ? 91 and 87 percent five- and ten-year OS; median not reached  ?Intermediate risk (2 to 3 points) ? 80 and 40 percent five- and ten-year OS; median 104 months  ?High risk (4 to 6 points) ? 53 and 16 percent five- and ten-year OS; median 63 months  ?Very high risk (7 to 10 points) ? 19 and 0 percent five- and ten-year OS; median 31 months  The type of first-line treatment was not identified as an independent factor in this analysis; however, the studies did not include treatment with novel oral inhibitors (eg, idelalisib, ibrutinib, venetoclax), which have demonstrated activity in patients with del(17p) or TP53 mutations. The ability of this scoring system to prognosticate OS and time to first treatment has also been validated in independent cohorts of patients with newly diagnosed and previously untreated patients with CLL     I reviewed the CLL?IPI prognostic group and I also discussed the survival data as above.  She falls in the low risk category with excellent prognosis.  I also reviewed the criteria for treatment and there is no indication for treatment at this point.      Labs from 05/10/2020 revealed that the WBC is slightly worse.  Hemoglobin and platelet counts are normal.  She does not meet the criteria for treatment.  Continue to monitor.  I will check CBC in 3 months.  WBC worse at 27.9 on 08/09/2020.  Hemoglobin 14.1 and platelet count 195.  Plan for follow-up CBC in 3 months.    2.  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    I received a note from patient's ENT physician Dr. Aniceto Boss who discussed with the pathologist again to make sure that this lymph node in the neck is not something different from her manifestation of CLL.  The pathologist confirmed that it is unlikely to be 2 different pathologies and hence agreed to cancel excisional biopsy of the left neck lymph node and proceed with bone marrow biopsy.    3.  She had questions  about vaccinations.  I advised her that CLL would put her at a higher risk of infections and I would encourage her to stay up-to-date on all the necessary vaccinations including Shingrix vaccine for which she will follow-up with her primary care physician.    CBC w diff  CBC with Diff Latest Ref Rng & Units 08/09/2020 02/16/2020   WBC 4.5 - 11.0 K/UL 27.9(H) 24.6(H)   RBC 4.0 - 5.0 M/UL 4.38 4.57   HGB 12.0 - 15.0 GM/DL 57.8 46.9   HCT 36 - 45 % 41.7 43.1   MCV 80 - 100 FL 95.3 94.2   MCH 26 - 34 PG 32.2 30.9   MCHC 32.0 - 36.0 G/DL 62.9 52.8   RDW 11 - 15 % 13.7 13.7   PLT 150 - 400 K/UL 195 228   MPV 7 - 11 FL 8.7 8.6   NEUT 41 - 77 % 15(L) 15(L)   ANC 1.8 - 7.0 K/UL 4.09 3.80   LYMA 24 - 44 % 82(H) 82(H)   ALYM 1.0 - 4.8 K/UL 22.99(H) 20.04(H)   MONA 4 - 12 % 2(L) 2(L)   AMONO 0 - 0.80 K/UL 0.62 0.55   EOSA 0 - 5 % 1 1   AEOS 0 - 0.45 K/UL 0.13 0.17   BASA 0 - 2 % 0 0   ABAS 0 - 0.20 K/UL 0.07 0.06

## 2020-08-21 ENCOUNTER — Encounter: Admit: 2020-08-21 | Discharge: 2020-08-21 | Payer: 59

## 2020-08-21 DIAGNOSIS — Z23 Encounter for immunization: Secondary | ICD-10-CM

## 2020-11-12 ENCOUNTER — Encounter: Admit: 2020-11-12 | Discharge: 2020-11-12 | Payer: 59

## 2020-11-12 DIAGNOSIS — C9110 Chronic lymphocytic leukemia of B-cell type not having achieved remission: Secondary | ICD-10-CM

## 2020-11-12 LAB — CBC AND DIFF
Lab: 13 % (ref 11–15)
Lab: 14 g/dL (ref 12.0–15.0)
Lab: 30 pg (ref 26–34)
Lab: 32 g/dL (ref 32.0–36.0)
Lab: 34 K/UL — ABNORMAL HIGH (ref 4.5–11.0)
Lab: 4.6 M/UL (ref 4.0–5.0)
Lab: 44 % (ref 36–45)
Lab: 96 FL (ref 80–100)

## 2020-11-19 ENCOUNTER — Encounter: Admit: 2020-11-19 | Discharge: 2020-11-19 | Payer: 59

## 2020-11-19 DIAGNOSIS — C9110 Chronic lymphocytic leukemia of B-cell type not having achieved remission: Secondary | ICD-10-CM

## 2020-11-19 DIAGNOSIS — M1990 Unspecified osteoarthritis, unspecified site: Secondary | ICD-10-CM

## 2020-11-19 NOTE — Progress Notes
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    History of Present Illness  Lindsey Brown is a 64 y.o. female.    Lindsey Brown is a 64 year old female presenting with leukocytosis and lymphadenopathy in her neck since 2019. She had one FNA on a lymph node 01/09/2020 in her neck that per her was inconclusive with evidence of predominantly small lymphocytes raising the possibility of a neoplastic process, so ENT had recommended for an excisional lymph node biopsy at [facility]. She was also treated with a two week course of doxycycline which did not bring down the size of her lymph nodes. She requested a second opinion on if having an excisional lymph node biopsy is the best next step and would like hem/onc opinion before moving forward. Labs are attached to the navigation e-mail. WBC in Jan 2021: 28.6, ALC: 16109.  WBC 28.6, hemoglobin 14.4, platelet count 229 and absolute lymphocyte count 22.3. Peripheral smear revealed leukocytosis, lymphocytosis and atypical lymphocytes.    CBC on 01/02/2020 revealed a WBC of 25.9, hemoglobin 14.3 and a platelet count of 266.    No history of fevers chills or night sweats.  No loss of weight or loss of appetite.  No nosebleeds or gum bleeding.  No hematemesis melena or urticaria.  No hemoptysis or hematuria.    Review of old records indicates that she had leukocytosis on 05/12/2018 with a WBC of 12.3, hemoglobin 14.1 and a platelet count of 231.  Absolute lymphocyte count was 6027.    Bone marrow biopsy performed at [facility] on 01/26/2020 reveals hypercellular marrow with trilineage hematopoiesis and 60% involvement by CLL.  CLL FISH panel unremarkable, and I GVH sequencing has been ordered.  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  Cytogenetics revealed a normal karyotype.    Interval History: No fever chills or night sweats.  No chest pain or shortness of breath.  No nausea vomiting.    She established with me on 01/23/2020 at [facility] heme-onc clinic.    Family history: Sister has cutaneous T-cell lymphoma.    Medical History:   Diagnosis Date   ? Arthritis      Surgical History:   Procedure Laterality Date   ? COLONOSCOPY       Family History   Problem Relation Age of Onset   ? Arthritis-rheumatoid Mother    ? Cancer Sister    ? Heart Disease Sister    ? Thyroid Disease Sister    ? Asthma Son    ? Diabetes Son      Social History     Socioeconomic History   ? Marital status: Married     Spouse name: Not on file   ? Number of children: Not on file   ? Years of education: Not on file   ? Highest education level: Not on file   Occupational History   ? Not on file   Tobacco Use   ? Smoking status: Former Smoker     Quit date: 1988     Years since quitting: 33.9   ? Smokeless tobacco: Never Used   Substance and Sexual Activity   ? Alcohol use: Yes   ? Drug use: Never   ? Sexual activity: Not on file   Other Topics Concern   ? Not on file  Social History Narrative   ? Not on file              Review of Systems   Constitutional: Negative.    HENT: Negative.    Eyes: Negative.    Respiratory: Negative.    Cardiovascular: Negative.    Gastrointestinal: Negative.    Genitourinary: Negative.    Musculoskeletal: Negative.    Skin: Negative.    Neurological: Negative.    Psychiatric/Behavioral: Negative.          Objective:         ? ALPRAZolam (XANAX) 0.5 mg tablet Take 0.5 mg by mouth daily.     Vitals:    11/19/20 1531   BP: (!) 140/82   BP Source: Arm, Left Upper   Patient Position: Sitting   Pulse: 81   Resp: 18   Temp: 36.5 ?C (97.7 ?F)   TempSrc: Temporal   SpO2: 98%   Weight: 81.3 kg (179 lb 3.2 oz)   Height: 173 cm (68.11)   PainSc: Zero     Body mass index is 27.16 kg/m?Marland Kitchen     Physical Exam  Constitutional:       Appearance: She is well-developed.   HENT:      Head: Normocephalic and atraumatic.   Cardiovascular:      Rate and Rhythm: Normal rate.      Heart sounds: Normal heart sounds.   Pulmonary:      Effort: Pulmonary effort is normal.      Breath sounds: Normal breath sounds.   Abdominal:      Palpations: Abdomen is soft.      Tenderness: There is no abdominal tenderness.   Musculoskeletal:         General: No tenderness.      Cervical back: Neck supple.   Skin:     General: Skin is warm and dry.   Neurological:      Mental Status: She is alert and oriented to person, place, and time.       Lymphatics: Left cervical lymph node measuring 2 cm palpable.  No other lymphadenopathy.  No hepatosplenomegaly.     Assessment and Plan:    1.  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  CLL-IPI Low risk: 91 and 87 percent five- and ten-year OS; median not reached.    Bone marrow biopsy performed at [facility] on 01/26/2020 reveals hypercellular marrow with trilineage hematopoiesis and 60% involvement by CLL.  CLL FISH panel and I GVH sequencing has been ordered.  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  Cytogenetics revealed a normal karyotype.  Fish studies on the bone marrow is negative for deletion of MYB, ATM, TP53, RB1, 13q14.3, and lamp 1, for trisomy 12, IGH?CCND1 (11;14) fusion, and for IGH rearrangement.  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  B2 microglobulin 2.5 on 02/16/2020.     Leukocytosis with absolute lymphocytosis and left cervical lymphadenopathy since May 2019.    WBC 12.3, hemoglobin 14.1 and platelet count 231 on 05/12/2018.  WBC 28.6, hemoglobin 14.4 and platelet count 229 on 01/12/2020.  WBC 27.0, hemoglobin 13.9 and platelet count 237 on 01/26/2020.  WBC 24.6 on 02/16/2020.  Hemoglobin 14.1 and platelet count 228.  CBC on 05/10/2020 reveals hemoglobin 13.6, WBC 27.4 and platelet count of 191.  Absolute lymphocyte count is 19.2.  WBC worse at 34.7 on 11/12/2020.  Hemoglobin normal at 14.2  and platelet count normal at 204.    She had one FNA on a lymph node 01/09/2020 in her neck that per her was inconclusive with evidence of predominantly small lymphocytes raising the possibility of a neoplastic process, so ENT had recommended for an excisional lymph node biopsy.    Discussed in detail prognosis and treatment options of CLL and I explained in detail and I also discussed the role of chemotherapy in patients with CLL.      CLL-IPI???The International CLL-IPI working group has proposed an international prognostic index for patients with CLL, which incorporates data regarding disease stage, biology, and patient-related factors. This staging system may help to assign patients with higher or lower risk CLL to particular interventions in clinical trials in order to get homogeneous study groups.  The scoring system was developed and validated using data from [id number] previously untreated patients enrolled on one of eight phase 3 clinical trials completed between 1997 and 2007 [123]. Five factors were identified as independent predictors of short survival on multivariate analysis and assigned a weighted score:  ?Del(17p) or TP53 mutation ? 4 points  ?Serum B2M >3.5 mg/L ? 2 points   ?Unmutated IGHV ? 2 points  ?Rai stage I to IV (table 1) or Binet stage B or C (table 2) ? 1 point  ?Age >65 years ? 1 point  Analysis of these five factors resulted in a final prognostic score, which was able to identify four risk groups with significantly different estimated rates of OS in the internal validation dataset:  ?Low risk (0 to 1 points) ? 91 and 87 percent five- and ten-year OS; median not reached  ?Intermediate risk (2 to 3 points) ? 80 and 40 percent five- and ten-year OS; median 104 months  ?High risk (4 to 6 points) ? 53 and 16 percent five- and ten-year OS; median 63 months  ?Very high risk (7 to 10 points) ? 19 and 0 percent five- and ten-year OS; median 31 months  The type of first-line treatment was not identified as an independent factor in this analysis; however, the studies did not include treatment with novel oral inhibitors (eg, idelalisib, ibrutinib, venetoclax), which have demonstrated activity in patients with del(17p) or TP53 mutations. The ability of this scoring system to prognosticate OS and time to first treatment has also been validated in independent cohorts of patients with newly diagnosed and previously untreated patients with CLL     I reviewed the CLL?IPI prognostic group and I also discussed the survival data as above.  She falls in the low risk category with excellent prognosis.  I also reviewed the criteria for treatment and there is no indication for treatment at this point.      Labs from 05/10/2020 revealed that the WBC is slightly worse.  Hemoglobin and platelet counts are normal.    WBC worse at 27.9 on 08/09/2020.  Hemoglobin 14.1 and platelet count 195.  Plan for follow-up CBC in 3 months.  WBC worse at 34.7 on 11/12/2020.  Hemoglobin normal at 14.2 and platelet count normal at 204.  She does not meet the criteria for treatment.  Continue to monitor.  I will check CBC in 3 months    2.  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    I received a note from patient's ENT physician Dr. Aniceto Boss who discussed with the pathologist again to make sure that this lymph node in the neck is not something different from her manifestation of CLL.  The pathologist confirmed that it is unlikely to be 2 different pathologies and hence agreed to cancel excisional biopsy of the left neck lymph node and proceed with bone marrow biopsy.    3.  She had questions about vaccinations.  I advised her that CLL would put her at a higher risk of infections and I would encourage her to stay up-to-date on all the necessary vaccinations including Shingrix vaccine for which she will follow-up with her primary care physician.    CBC w diff  CBC with Diff Latest Ref Rng & Units 11/12/2020 08/09/2020 02/16/2020   WBC 4.5 - 11.0 K/UL 34.7(H) 27.9(H) 24.6(H)   RBC 4.0 - 5.0 M/UL 4.60 4.38 4.57   HGB 12.0 - 15.0 GM/DL 16.1 09.6 04.5   HCT 36 - 45 % 44.3 41.7 43.1   MCV 80 - 100 FL 96.5 95.3 94.2   MCH 26 - 34 PG 30.9 32.2 30.9   MCHC 32.0 - 36.0 G/DL 40.9 81.1 91.4   RDW 11 - 15 % 13.7 13.7 13.7   PLT 150 - 400 K/UL 204 195 228   MPV 7 - 11 FL 8.7 8.7 8.6   NEUT 41 - 77 % 11(L) 15(L) 15(L)   ANC 1.8 - 7.0 K/UL 3.63 4.09 3.80   LYMA 24 - 44 % 87(H) 82(H) 82(H)   ALYM 1.0 - 4.8 K/UL 30.13(H) 22.99(H) 20.04(H)   MONA 4 - 12 % 2(L) 2(L) 2(L)   AMONO 0 - 0.80 K/UL 0.67 0.62 0.55   EOSA 0 - 5 % 0 1 1   AEOS 0 - 0.45 K/UL 0.13 0.13 0.17   BASA 0 - 2 % 0 0 0   ABAS 0 - 0.20 K/UL 0.14 0.07 0.06

## 2020-12-27 ENCOUNTER — Encounter: Admit: 2020-12-27 | Discharge: 2020-12-27 | Payer: 59

## 2020-12-27 DIAGNOSIS — C9110 Chronic lymphocytic leukemia of B-cell type not having achieved remission: Secondary | ICD-10-CM

## 2020-12-27 DIAGNOSIS — M1990 Unspecified osteoarthritis, unspecified site: Secondary | ICD-10-CM

## 2020-12-27 LAB — CBC AND DIFF
Lab: 0 % (ref 0–2)
Lab: 0.1 K/UL (ref 0–0.20)
Lab: 0.1 K/UL (ref 0–0.45)
Lab: 0.6 K/UL (ref 0–0.80)
Lab: 1 % (ref 0–5)
Lab: 10 % — ABNORMAL LOW (ref 41–77)
Lab: 13 % (ref 11–15)
Lab: 13 g/dL (ref 12.0–15.0)
Lab: 2 % — ABNORMAL LOW (ref 4–12)
Lab: 214 K/UL (ref 150–400)
Lab: 3.6 K/UL (ref 1.8–7.0)
Lab: 30 pg — ABNORMAL HIGH (ref 26–34)
Lab: 32 g/dL (ref >60)
Lab: 33 K/UL — ABNORMAL HIGH (ref 1.0–4.8)
Lab: 38 K/UL — ABNORMAL HIGH (ref 4.5–11.0)
Lab: 4.5 M/UL (ref 4.0–5.0)
Lab: 42 % — ABNORMAL HIGH (ref 36–45)
Lab: 8.6 FL (ref 7–11)
Lab: 87 % — ABNORMAL HIGH (ref 24–44)
Lab: 94 FL (ref 80–100)

## 2020-12-27 NOTE — Progress Notes
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    History of Present Illness  Lindsey Brown is a 65 y.o. female.    Lindsey Brown is a 65 year old female presenting with leukocytosis and lymphadenopathy in her neck since 2019. She had one FNA on a lymph node 01/09/2020 in her neck that per her was inconclusive with evidence of predominantly small lymphocytes raising the possibility of a neoplastic process, so ENT had recommended for an excisional lymph node biopsy at [facility]. She was also treated with a two week course of doxycycline which did not bring down the size of her lymph nodes. She requested a second opinion on if having an excisional lymph node biopsy is the best next step and would like hem/onc opinion before moving forward. Labs are attached to the navigation e-mail. WBC in Jan 2021: 28.6, ALC: 16109.  WBC 28.6, hemoglobin 14.4, platelet count 229 and absolute lymphocyte count 22.3. Peripheral smear revealed leukocytosis, lymphocytosis and atypical lymphocytes.    CBC on 01/02/2020 revealed a WBC of 25.9, hemoglobin 14.3 and a platelet count of 266.    No history of fevers chills or night sweats.  No loss of weight or loss of appetite.  No nosebleeds or gum bleeding.  No hematemesis melena or urticaria.  No hemoptysis or hematuria.    Review of old records indicates that she had leukocytosis on 05/12/2018 with a WBC of 12.3, hemoglobin 14.1 and a platelet count of 231.  Absolute lymphocyte count was 6027.    Bone marrow biopsy performed at [facility] on 01/26/2020 reveals hypercellular marrow with trilineage hematopoiesis and 60% involvement by CLL.  CLL FISH panel unremarkable, and I GVH sequencing has been ordered.  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  Cytogenetics revealed a normal karyotype.    Interval History: No fever chills or night sweats.  No chest pain or shortness of breath.  No nausea vomiting.  She noticed a new lymph node in the right cervical region on 12/27/2020 and hence she came in for an urgent care visit on 12/27/2020.  She reports that it is mildly painful but she denies any sore throat fever or infections.  She thinks she has a bad tooth and have advised her to see her dentist.    She established with me on 01/23/2020 at [facility] heme-onc clinic.    Family history: Sister has cutaneous T-cell lymphoma.    Medical History:   Diagnosis Date   ? Arthritis      Surgical History:   Procedure Laterality Date   ? COLONOSCOPY       Family History   Problem Relation Age of Onset   ? Arthritis-rheumatoid Mother    ? Cancer Sister    ? Heart Disease Sister    ? Thyroid Disease Sister    ? Asthma Son    ? Diabetes Son      Social History     Socioeconomic History   ? Marital status: Married     Spouse name: Not on file   ? Number of children: Not on file   ? Years of education: Not on file   ? Highest education level: Not on file   Occupational History   ? Not on file   Tobacco Use   ? Smoking status: Former Smoker  Quit date: 49     Years since quitting: 34.0   ? Smokeless tobacco: Never Used   Substance and Sexual Activity   ? Alcohol use: Yes   ? Drug use: Never   ? Sexual activity: Not on file   Other Topics Concern   ? Not on file   Social History Narrative   ? Not on file              Review of Systems   Constitutional: Negative.    HENT: Negative.    Eyes: Negative.    Respiratory: Negative.    Cardiovascular: Negative.    Gastrointestinal: Negative.    Genitourinary: Negative.    Musculoskeletal: Negative.    Skin: Negative.    Neurological: Negative.    Psychiatric/Behavioral: Negative.          Objective:         ? ALPRAZolam (XANAX) 0.5 mg tablet Take 0.5 mg by mouth daily.     Vitals:    12/27/20 1315   BP: (!) 149/78   BP Source: Arm, Left Upper   Patient Position: Sitting Pulse: 80   Resp: 18   Temp: 36.4 ?C (97.5 ?F)   TempSrc: Temporal   SpO2: 100%   Weight: 85.5 kg (188 lb 6.4 oz)   Height: 173 cm (68.11)   PainSc: Three     Body mass index is 28.55 kg/m?Marland Kitchen     Physical Exam  Constitutional:       Appearance: She is well-developed.   HENT:      Head: Normocephalic and atraumatic.   Cardiovascular:      Rate and Rhythm: Normal rate.      Heart sounds: Normal heart sounds.   Pulmonary:      Effort: Pulmonary effort is normal.      Breath sounds: Normal breath sounds.   Abdominal:      Palpations: Abdomen is soft.      Tenderness: There is no abdominal tenderness.   Musculoskeletal:         General: No tenderness.      Cervical back: Neck supple.   Skin:     General: Skin is warm and dry.   Neurological:      Mental Status: She is alert and oriented to person, place, and time.       Lymphatics: Left cervical lymph node measuring 2 cm palpable.  No other lymphadenopathy.  No hepatosplenomegaly.     Assessment and Plan:    1.  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  CLL-IPI Low risk: 91 and 87 percent five- and ten-year OS; median not reached.    Bone marrow biopsy performed at [facility] on 01/26/2020 reveals hypercellular marrow with trilineage hematopoiesis and 60% involvement by CLL.  CLL FISH panel and I GVH sequencing has been ordered.  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  Cytogenetics revealed a normal karyotype.  Fish studies on the bone marrow is negative for deletion of MYB, ATM, TP53, RB1, 13q14.3, and lamp 1, for trisomy 12, IGH?CCND1 (11;14) fusion, and for IGH rearrangement.  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  B2 microglobulin 2.5 on 02/16/2020.     Leukocytosis with absolute lymphocytosis and left cervical lymphadenopathy since May 2019.    WBC 12.3, hemoglobin 14.1 and platelet count 231 on 05/12/2018.  WBC 28.6, hemoglobin 14.4 and platelet count 229 on 01/12/2020.  WBC 27.0, hemoglobin 13.9 and platelet count 237 on 01/26/2020.  WBC 24.6 on 02/16/2020.  Hemoglobin 14.1 and platelet count 228.  CBC on 05/10/2020 reveals hemoglobin 13.6, WBC 27.4 and platelet count of 191.  Absolute lymphocyte count is 19.2.  WBC worse at 34.7 on 11/12/2020.  Hemoglobin normal at 14.2 and platelet count normal at 204.    She had one FNA on a lymph node 01/09/2020 in her neck that per her was inconclusive with evidence of predominantly small lymphocytes raising the possibility of a neoplastic process, so ENT had recommended for an excisional lymph node biopsy.    Discussed in detail prognosis and treatment options of CLL and I explained in detail and I also discussed the role of chemotherapy in patients with CLL.      CLL-IPI???The International CLL-IPI working group has proposed an international prognostic index for patients with CLL, which incorporates data regarding disease stage, biology, and patient-related factors. This staging system may help to assign patients with higher or lower risk CLL to particular interventions in clinical trials in order to get homogeneous study groups.  The scoring system was developed and validated using data from [id number] previously untreated patients enrolled on one of eight phase 3 clinical trials completed between 1997 and 2007 [123]. Five factors were identified as independent predictors of short survival on multivariate analysis and assigned a weighted score:  ?Del(17p) or TP53 mutation ? 4 points  ?Serum B2M >3.5 mg/L ? 2 points   ?Unmutated IGHV ? 2 points  ?Rai stage I to IV (table 1) or Binet stage B or C (table 2) ? 1 point  ?Age >65 years ? 1 point  Analysis of these five factors resulted in a final prognostic score, which was able to identify four risk groups with significantly different estimated rates of OS in the internal validation dataset:  ?Low risk (0 to 1 points) ? 91 and 87 percent five- and ten-year OS; median not reached  ?Intermediate risk (2 to 3 points) ? 80 and 40 percent five- and ten-year OS; median 104 months  ?High risk (4 to 6 points) ? 53 and 16 percent five- and ten-year OS; median 63 months  ?Very high risk (7 to 10 points) ? 19 and 0 percent five- and ten-year OS; median 31 months  The type of first-line treatment was not identified as an independent factor in this analysis; however, the studies did not include treatment with novel oral inhibitors (eg, idelalisib, ibrutinib, venetoclax), which have demonstrated activity in patients with del(17p) or TP53 mutations. The ability of this scoring system to prognosticate OS and time to first treatment has also been validated in independent cohorts of patients with newly diagnosed and previously untreated patients with CLL     I reviewed the CLL?IPI prognostic group and I also discussed the survival data as above.  She falls in the low risk category with excellent prognosis.  I also reviewed the criteria for treatment and there is no indication for treatment at this point.      Labs from 05/10/2020 revealed that the WBC is slightly worse.  Hemoglobin and platelet counts are normal.    WBC worse at 27.9 on 08/09/2020.  Hemoglobin 14.1 and platelet count 195.  Plan for follow-up CBC in 3 months.  WBC worse at 34.7 on 11/12/2020.  Hemoglobin normal at 14.2 and platelet count normal at 204.    She noticed a new lymph node in the right cervical region on 12/27/2020 and hence she  came in for an urgent care visit on 12/27/2020.  She reports that it is mildly painful but she denies any sore throat fever or infections.  She thinks she has a bad tooth and have advised her to see her dentist.  I advised her to keep a watch on this and call again if this any significant worsening.  I also gave her the option of another biopsy and she would like to defer it for now.  I will check a CBC today and a CBC again next month.    She does not meet the criteria for treatment.  Continue to monitor.  I will check CBC in 3 months    2.  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    I received a note from patient's ENT physician Dr. Aniceto Boss who discussed with the pathologist again to make sure that this lymph node in the neck is not something different from her manifestation of CLL.  The pathologist confirmed that it is unlikely to be 2 different pathologies and hence agreed to cancel excisional biopsy of the left neck lymph node and proceed with bone marrow biopsy.    3.  She had questions about vaccinations.  I advised her that CLL would put her at a higher risk of infections and I would encourage her to stay up-to-date on all the necessary vaccinations including Shingrix vaccine for which she will follow-up with her primary care physician.    CBC w diff  CBC with Diff Latest Ref Rng & Units 11/12/2020 08/09/2020 02/16/2020   WBC 4.5 - 11.0 K/UL 34.7(H) 27.9(H) 24.6(H)   RBC 4.0 - 5.0 M/UL 4.60 4.38 4.57   HGB 12.0 - 15.0 GM/DL 16.1 09.6 04.5   HCT 36 - 45 % 44.3 41.7 43.1   MCV 80 - 100 FL 96.5 95.3 94.2   MCH 26 - 34 PG 30.9 32.2 30.9   MCHC 32.0 - 36.0 G/DL 40.9 81.1 91.4   RDW 11 - 15 % 13.7 13.7 13.7   PLT 150 - 400 K/UL 204 195 228   MPV 7 - 11 FL 8.7 8.7 8.6   NEUT 41 - 77 % 11(L) 15(L) 15(L)   ANC 1.8 - 7.0 K/UL 3.63 4.09 3.80   LYMA 24 - 44 % 87(H) 82(H) 82(H)   ALYM 1.0 - 4.8 K/UL 30.13(H) 22.99(H) 20.04(H)   MONA 4 - 12 % 2(L) 2(L) 2(L)   AMONO 0 - 0.80 K/UL 0.67 0.62 0.55   EOSA 0 - 5 % 0 1 1   AEOS 0 - 0.45 K/UL 0.13 0.13 0.17   BASA 0 - 2 % 0 0 0   ABAS 0 - 0.20 K/UL 0.14 0.07 0.06

## 2021-02-18 ENCOUNTER — Encounter: Admit: 2021-02-18 | Discharge: 2021-02-18 | Payer: 59

## 2021-02-18 DIAGNOSIS — C9110 Chronic lymphocytic leukemia of B-cell type not having achieved remission: Secondary | ICD-10-CM

## 2021-02-18 LAB — CBC AND DIFF
Lab: 0 % (ref 0–2)
Lab: 0 K/UL (ref 0–0.20)
Lab: 0.1 K/UL (ref 0–0.45)
Lab: 0.8 K/UL — ABNORMAL HIGH (ref 0–0.80)
Lab: 1 % (ref 0–5)
Lab: 12 % — ABNORMAL LOW (ref 41–77)
Lab: 13 % (ref 11–15)
Lab: 13 g/dL (ref 12.0–15.0)
Lab: 187 K/UL (ref 150–400)
Lab: 29 K/UL — ABNORMAL HIGH (ref 1.0–4.8)
Lab: 3 % — ABNORMAL LOW (ref 4–12)
Lab: 31 pg (ref 26–34)
Lab: 33 g/dL (ref 32.0–36.0)
Lab: 34 K/UL — ABNORMAL HIGH (ref 4.5–11.0)
Lab: 4 K/UL (ref 1.8–7.0)
Lab: 4.2 M/UL (ref 4.0–5.0)
Lab: 40 % — ABNORMAL HIGH (ref 36–45)
Lab: 8.9 FL (ref 7–11)
Lab: 84 % — ABNORMAL HIGH (ref 24–44)
Lab: 95 FL (ref 80–100)

## 2021-02-25 ENCOUNTER — Encounter: Admit: 2021-02-25 | Discharge: 2021-02-25 | Payer: 59

## 2021-02-25 DIAGNOSIS — C9110 Chronic lymphocytic leukemia of B-cell type not having achieved remission: Secondary | ICD-10-CM

## 2021-02-25 NOTE — Progress Notes
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    Obtained patient's verbal consent to treat them and their agreement to [facility] financial policy and NPP via this telehealth visit during the [facility]    History of Present Illness  Lindsey Brown is a 65 y.o. female.    Lindsey Brown is a 65 year old female presenting with leukocytosis and lymphadenopathy in her neck since 2019. She had one FNA on a lymph node 01/09/2020 in her neck that per her was inconclusive with evidence of predominantly small lymphocytes raising the possibility of a neoplastic process, so ENT had recommended for an excisional lymph node biopsy at [facility]. She was also treated with a two week course of doxycycline which did not bring down the size of her lymph nodes. She requested a second opinion on if having an excisional lymph node biopsy is the best next step and would like hem/onc opinion before moving forward. Labs are attached to the navigation e-mail. WBC in Jan 2021: 28.6, ALC: 75102.  WBC 28.6, hemoglobin 14.4, platelet count 229 and absolute lymphocyte count 22.3. Peripheral smear revealed leukocytosis, lymphocytosis and atypical lymphocytes.    CBC on 01/02/2020 revealed a WBC of 25.9, hemoglobin 14.3 and a platelet count of 266.    No history of fevers chills or night sweats.  No loss of weight or loss of appetite.  No nosebleeds or gum bleeding.  No hematemesis melena or urticaria.  No hemoptysis or hematuria.    Review of old records indicates that she had leukocytosis on 05/12/2018 with a WBC of 12.3, hemoglobin 14.1 and a platelet count of 231.  Absolute lymphocyte count was 6027.    Bone marrow biopsy performed at [facility] on 01/26/2020 reveals hypercellular marrow with trilineage hematopoiesis and 60% involvement by CLL.  CLL FISH panel unremarkable, and I GVH sequencing has been ordered.  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  Cytogenetics revealed a normal karyotype.    She noticed a new lymph node in the right cervical region on 12/27/2020 and hence she came in for an urgent care visit on 12/27/2020.  She reports that it is mildly painful but she denies any sore throat fever or infections.  She thinks she has a bad tooth and have advised her to see her dentist.    She established with me on 01/23/2020 at [facility] heme-onc clinic.    Interval History: No fever chills or night sweats.  No chest pain or shortness of breath.  No nausea vomiting.    Family history: Sister has cutaneous T-cell lymphoma.    Medical History:   Diagnosis Date   ? Arthritis      Surgical History:   Procedure Laterality Date   ? COLONOSCOPY       Family History   Problem Relation Age of Onset   ? Arthritis-rheumatoid Mother    ? Cancer Sister    ? Heart Disease Sister    ? Thyroid Disease Sister    ? Asthma Son    ? Diabetes Son      Social History     Socioeconomic History   ? Marital status: Married     Spouse name: Not on file   ? Number of children: Not on file   ? Years of education: Not on  file   ? Highest education level: Not on file   Occupational History   ? Not on file   Tobacco Use   ? Smoking status: Former Smoker     Quit date: 1988     Years since quitting: 34.2   ? Smokeless tobacco: Never Used   Substance and Sexual Activity   ? Alcohol use: Yes   ? Drug use: Never   ? Sexual activity: Not on file   Other Topics Concern   ? Not on file   Social History Narrative   ? Not on file              Review of Systems   Constitutional: Negative.    HENT: Negative.    Eyes: Negative.    Respiratory: Negative.    Cardiovascular: Negative.    Gastrointestinal: Negative.    Genitourinary: Negative.    Musculoskeletal: Negative.    Skin: Negative.    Neurological: Negative.    Psychiatric/Behavioral: Negative.          Objective: ? ALPRAZolam (XANAX) 0.5 mg tablet Take 0.5 mg by mouth daily.     There were no vitals filed for this visit.  There is no height or weight on file to calculate BMI.     Physical Exam  Constitutional:       Appearance: She is well-developed.   HENT:      Head: Normocephalic and atraumatic.   Cardiovascular:      Rate and Rhythm: Normal rate.      Heart sounds: Normal heart sounds.   Pulmonary:      Effort: Pulmonary effort is normal.      Breath sounds: Normal breath sounds.   Abdominal:      Palpations: Abdomen is soft.      Tenderness: There is no abdominal tenderness.   Musculoskeletal:         General: No tenderness.      Cervical back: Neck supple.   Skin:     General: Skin is warm and dry.   Neurological:      Mental Status: She is alert and oriented to person, place, and time.       Lymphatics: Left cervical lymph node measuring 2 cm palpable.  No other lymphadenopathy.  No hepatosplenomegaly.     Assessment and Plan:    1.  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  CLL-IPI Low risk: 91 and 87 percent five- and ten-year OS; median not reached.    Bone marrow biopsy performed at [facility] on 01/26/2020 reveals hypercellular marrow with trilineage hematopoiesis and 60% involvement by CLL.  CLL FISH panel and I GVH sequencing has been ordered.  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  Cytogenetics revealed a normal karyotype.  Fish studies on the bone marrow is negative for deletion of MYB, ATM, TP53, RB1, 13q14.3, and lamp 1, for trisomy 12, IGH?CCND1 (11;14) fusion, and for IGH rearrangement.  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  B2 microglobulin 2.5 on 02/16/2020.     Leukocytosis with absolute lymphocytosis and left cervical lymphadenopathy since May 2019.    WBC 12.3, hemoglobin 14.1 and platelet count 231 on 05/12/2018.  WBC 28.6, hemoglobin 14.4 and platelet count 229 on 01/12/2020.  WBC 27.0, hemoglobin 13.9 and platelet count 237 on 01/26/2020.  WBC 24.6 on 02/16/2020.  Hemoglobin 14.1 and platelet count 228.  CBC on  05/10/2020 reveals hemoglobin 13.6, WBC 27.4 and platelet count of 191.  Absolute lymphocyte count is 19.2.  WBC worse at 34.7 on 11/12/2020.  Hemoglobin normal at 14.2 and platelet count normal at 204.    She had one FNA on a lymph node 01/09/2020 in her neck that per her was inconclusive with evidence of predominantly small lymphocytes raising the possibility of a neoplastic process, so ENT had recommended for an excisional lymph node biopsy.    Discussed in detail prognosis and treatment options of CLL and I explained in detail and I also discussed the role of chemotherapy in patients with CLL.      CLL-IPI???The International CLL-IPI working group has proposed an international prognostic index for patients with CLL, which incorporates data regarding disease stage, biology, and patient-related factors. This staging system may help to assign patients with higher or lower risk CLL to particular interventions in clinical trials in order to get homogeneous study groups.  The scoring system was developed and validated using data from [id number] previously untreated patients enrolled on one of eight phase 3 clinical trials completed between 1997 and 2007 [123]. Five factors were identified as independent predictors of short survival on multivariate analysis and assigned a weighted score:  ?Del(17p) or TP53 mutation ? 4 points  ?Serum B2M >3.5 mg/L ? 2 points   ?Unmutated IGHV ? 2 points  ?Rai stage I to IV (table 1) or Binet stage B or C (table 2) ? 1 point  ?Age >65 years ? 1 point  Analysis of these five factors resulted in a final prognostic score, which was able to identify four risk groups with significantly different estimated rates of OS in the internal validation dataset:  ?Low risk (0 to 1 points) ? 91 and 87 percent five- and ten-year OS; median not reached  ?Intermediate risk (2 to 3 points) ? 80 and 40 percent five- and ten-year OS; median 104 months  ?High risk (4 to 6 points) ? 53 and 16 percent five- and ten-year OS; median 63 months  ?Very high risk (7 to 10 points) ? 19 and 0 percent five- and ten-year OS; median 31 months  The type of first-line treatment was not identified as an independent factor in this analysis; however, the studies did not include treatment with novel oral inhibitors (eg, idelalisib, ibrutinib, venetoclax), which have demonstrated activity in patients with del(17p) or TP53 mutations. The ability of this scoring system to prognosticate OS and time to first treatment has also been validated in independent cohorts of patients with newly diagnosed and previously untreated patients with CLL     I reviewed the CLL?IPI prognostic group and I also discussed the survival data as above.  She falls in the low risk category with excellent prognosis.  I also reviewed the criteria for treatment and there is no indication for treatment at this point.      Labs from 05/10/2020 revealed that the WBC is slightly worse.  Hemoglobin and platelet counts are normal.    WBC worse at 27.9 on 08/09/2020.  Hemoglobin 14.1 and platelet count 195.  Plan for follow-up CBC in 3 months.  WBC worse at 34.7 on 11/12/2020.  Hemoglobin normal at 14.2 and platelet count normal at 204.    She noticed a new lymph node in the right cervical region on 12/27/2020 and hence she came in for an urgent care visit on 12/27/2020.  She reports that it is mildly painful but she denies any sore throat fever or  infections.  She thinks she has a bad tooth and have advised her to see her dentist.  I advised her to keep a watch on this and call again if this any significant worsening.  I also gave her the option of another biopsy and she would like to defer it for now.    I discussed with the patient in detail on 02/25/2021.  She mentions that she had a course of antibiotics at home when she took it in the lymph node shrunk and resolved very quickly.  She subsequently saw her dentist who did not find any abnormalities.  She has not had any recurrence of this right cervical lymph node enlargement.    I will check CBC in 3 months    2.  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    I received a note from patient's ENT physician Dr. Aniceto Boss who discussed with the pathologist again to make sure that this lymph node in the neck is not something different from her manifestation of CLL.  The pathologist confirmed that it is unlikely to be 2 different pathologies and hence agreed to cancel excisional biopsy of the left neck lymph node and proceed with bone marrow biopsy.    3.  She had questions about vaccinations.  I advised her that CLL would put her at a higher risk of infections and I would encourage her to stay up-to-date on all the necessary vaccinations including Shingrix vaccine for which she will follow-up with her primary care physician.    CBC w diff  CBC with Diff Latest Ref Rng & Units 02/18/2021 12/27/2020 11/12/2020 08/09/2020 02/16/2020   WBC 4.5 - 11.0 K/UL 34.4(H) 38.1(H) 34.7(H) 27.9(H) 24.6(H)   RBC 4.0 - 5.0 M/UL 4.22 4.51 4.60 4.38 4.57   HGB 12.0 - 15.0 GM/DL 16.1 09.6 04.5 40.9 81.1   HCT 36 - 45 % 40.2 42.8 44.3 41.7 43.1   MCV 80 - 100 FL 95.3 94.9 96.5 95.3 94.2   MCH 26 - 34 PG 31.5 30.8 30.9 32.2 30.9   MCHC 32.0 - 36.0 G/DL 91.4 78.2 95.6 21.3 08.6   RDW 11 - 15 % 13.2 13.1 13.7 13.7 13.7   PLT 150 - 400 K/UL 187 214 204 195 228   MPV 7 - 11 FL 8.9 8.6 8.7 8.7 8.6   NEUT 41 - 77 % 12(L) 10(L) 11(L) 15(L) 15(L)   ANC 1.8 - 7.0 K/UL 4.09 3.68 3.63 4.09 3.80   LYMA 24 - 44 % 84(H) 87(H) 87(H) 82(H) 82(H)   ALYM 1.0 - 4.8 K/UL 29.22(H) 33.42(H) 30.13(H) 22.99(H) 20.04(H)   MONA 4 - 12 % 3(L) 2(L) 2(L) 2(L) 2(L)   AMONO 0 - 0.80 K/UL 0.86(H) 0.69 0.67 0.62 0.55   EOSA 0 - 5 % 1 1 0 1 1   AEOS 0 - 0.45 K/UL 0.17 0.18 0.13 0.13 0.17   BASA 0 - 2 % 0 0 0 0 0   ABAS 0 - 0.20 K/UL 0.09 0.11 0.14 0.07 0.06                            15 min

## 2021-05-09 ENCOUNTER — Encounter: Admit: 2021-05-09 | Discharge: 2021-05-09 | Payer: 59

## 2021-05-09 DIAGNOSIS — C9110 Chronic lymphocytic leukemia of B-cell type not having achieved remission: Secondary | ICD-10-CM

## 2021-05-09 LAB — CBC AND DIFF
ABSOLUTE BASO COUNT: 0 K/UL (ref 0–0.20)
ABSOLUTE EOS COUNT: 0.2 K/UL (ref 0–0.45)
ABSOLUTE LYMPH COUNT: 34 K/UL — ABNORMAL HIGH (ref 1.0–4.8)
ABSOLUTE MONO COUNT: 0.8 K/UL — ABNORMAL HIGH (ref 0–0.80)
ABSOLUTE NEUTROPHIL: 4.9 K/UL (ref 1.8–7.0)
BASOPHILS %: 0 % (ref 0–2)
EOSINOPHILS %: 1 % (ref 0–5)
HEMATOCRIT: 41 % (ref 36–45)
HEMOGLOBIN: 13 g/dL (ref 12.0–15.0)
LYMPHOCYTES %: 85 % — ABNORMAL HIGH (ref 24–44)
MCH: 31 pg (ref 26–34)
MCHC: 32 g/dL (ref 32.0–36.0)
MCV: 96 FL (ref 80–100)
MONOCYTES %: 2 % — ABNORMAL LOW (ref 4–12)
MPV: 9 FL (ref 7–11)
NEUTROPHILS %: 12 % — ABNORMAL LOW (ref 41–77)
PLATELET COUNT: 204 K/UL (ref 150–400)
RBC COUNT: 4.3 M/UL (ref 4.0–5.0)
RDW: 13 % (ref 11–15)
WBC COUNT: 40 K/UL — ABNORMAL HIGH (ref 4.5–11.0)

## 2021-05-13 ENCOUNTER — Encounter: Admit: 2021-05-13 | Discharge: 2021-05-13 | Payer: 59

## 2021-05-13 DIAGNOSIS — M1990 Unspecified osteoarthritis, unspecified site: Secondary | ICD-10-CM

## 2021-05-13 DIAGNOSIS — C9110 Chronic lymphocytic leukemia of B-cell type not having achieved remission: Secondary | ICD-10-CM

## 2021-05-13 NOTE — Progress Notes
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    History of Present Illness  Lindsey Brown is a 65 y.o. female.    Lindsey Brown is a 65 year old female presenting with leukocytosis and lymphadenopathy in her neck since 2019. She had one FNA on a lymph node 01/09/2020 in her neck that per her was inconclusive with evidence of predominantly small lymphocytes raising the possibility of a neoplastic process, so ENT had recommended for an excisional lymph node biopsy at [facility]. She was also treated with a two week course of doxycycline which did not bring down the size of her lymph nodes. She requested a second opinion on if having an excisional lymph node biopsy is the best next step and would like hem/onc opinion before moving forward. Labs are attached to the navigation e-mail. WBC in Jan 2021: 28.6, ALC: 96045.  WBC 28.6, hemoglobin 14.4, platelet count 229 and absolute lymphocyte count 22.3. Peripheral smear revealed leukocytosis, lymphocytosis and atypical lymphocytes.    CBC on 01/02/2020 revealed a WBC of 25.9, hemoglobin 14.3 and a platelet count of 266.    No history of fevers chills or night sweats.  No loss of weight or loss of appetite.  No nosebleeds or gum bleeding.  No hematemesis melena or urticaria.  No hemoptysis or hematuria.    Review of old records indicates that she had leukocytosis on 05/12/2018 with a WBC of 12.3, hemoglobin 14.1 and a platelet count of 231.  Absolute lymphocyte count was 6027.    Bone marrow biopsy performed at [facility] on 01/26/2020 reveals hypercellular marrow with trilineage hematopoiesis and 60% involvement by CLL.  CLL FISH panel unremarkable, and I GVH sequencing has been ordered.  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  Cytogenetics revealed a normal karyotype.    She noticed a new lymph node in the right cervical region on 12/27/2020 and hence she came in for an urgent care visit on 12/27/2020.  She reports that it is mildly painful but she denies any sore throat fever or infections.  She thinks she has a bad tooth and have advised her to see her dentist.    She established with me on 01/23/2020 at [facility] heme-onc clinic.    Interval History: No fever chills or night sweats.  No chest pain or shortness of breath.  No nausea vomiting.    Family history: Sister has cutaneous T-cell lymphoma.    Medical History:   Diagnosis Date   ? Arthritis      Surgical History:   Procedure Laterality Date   ? COLONOSCOPY       Family History   Problem Relation Age of Onset   ? Arthritis-rheumatoid Mother    ? Cancer Sister    ? Heart Disease Sister    ? Thyroid Disease Sister    ? Asthma Son    ? Diabetes Son      Social History     Socioeconomic History   ? Marital status: Married   Tobacco Use   ? Smoking status: Former Smoker     Quit date: 1988     Years since quitting: 34.4   ? Smokeless tobacco: Never Used   Substance and Sexual Activity   ? Alcohol use: Yes   ? Drug use: Never  Review of Systems   Constitutional: Negative.    HENT: Negative.    Eyes: Negative.    Respiratory: Negative.    Cardiovascular: Negative.    Gastrointestinal: Negative.    Genitourinary: Negative.    Musculoskeletal: Negative.    Skin: Negative.    Neurological: Negative.    Psychiatric/Behavioral: Negative.          Objective:         ? ALPRAZolam (XANAX) 0.5 mg tablet Take 0.5 mg by mouth daily.   ? atorvastatin (LIPITOR) 10 mg tablet TAKE 1 TABLET BY MOUTH ONCE DAILY FOR 90 DAYS     Vitals:    05/13/21 1335   BP: 132/81   BP Source: Arm, Right Upper   Pulse: 94   Temp: 36.4 ?C (97.5 ?F)   Resp: 18   SpO2: 100%   TempSrc: Temporal   PainSc: Zero   Weight: 79.4 kg (175 lb)   Height: 172.4 cm (5' 7.87)     Body mass index is 26.71 kg/m?Marland Kitchen     Physical Exam  Constitutional:       Appearance: She is well-developed.   HENT:      Head: Normocephalic and atraumatic.   Cardiovascular:      Rate and Rhythm: Normal rate.      Heart sounds: Normal heart sounds.   Pulmonary:      Effort: Pulmonary effort is normal.      Breath sounds: Normal breath sounds.   Abdominal:      Palpations: Abdomen is soft.      Tenderness: There is no abdominal tenderness.   Musculoskeletal:         General: No tenderness.      Cervical back: Neck supple.   Skin:     General: Skin is warm and dry.   Neurological:      Mental Status: She is alert and oriented to person, place, and time.       Lymphatics: Left cervical lymph node measuring 2 cm palpable.  No other lymphadenopathy.  No hepatosplenomegaly.     Assessment and Plan:    1.  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  CLL-IPI Low risk: 91 and 87 percent five- and ten-year OS; median not reached.    Bone marrow biopsy performed at [facility] on 01/26/2020 reveals hypercellular marrow with trilineage hematopoiesis and 60% involvement by CLL.  CLL FISH panel and I GVH sequencing has been ordered.  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  Cytogenetics revealed a normal karyotype.  Fish studies on the bone marrow is negative for deletion of MYB, ATM, TP53, RB1, 13q14.3, and lamp 1, for trisomy 12, IGH-CCND1 (11;14) fusion, and for IGH rearrangement.  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  B2 microglobulin 2.5 on 02/16/2020.     Leukocytosis with absolute lymphocytosis and left cervical lymphadenopathy since May 2019.    WBC 12.3, hemoglobin 14.1 and platelet count 231 on 05/12/2018.  WBC 28.6, hemoglobin 14.4 and platelet count 229 on 01/12/2020.  WBC 27.0, hemoglobin 13.9 and platelet count 237 on 01/26/2020.  WBC 24.6 on 02/16/2020.  Hemoglobin 14.1 and platelet count 228.  CBC on 05/10/2020 reveals hemoglobin 13.6, WBC 27.4 and platelet count of 191.  Absolute lymphocyte count is 19.2.  WBC worse at 34.7 on 11/12/2020.  Hemoglobin normal at 14.2 and platelet count normal at 204.    She had  one FNA on a lymph node 01/09/2020 in her neck that per her was inconclusive with evidence of predominantly small lymphocytes raising the possibility of a neoplastic process, so ENT had recommended for an excisional lymph node biopsy.    Discussed in detail prognosis and treatment options of CLL and I explained in detail and I also discussed the role of chemotherapy in patients with CLL.      CLL-IPI?--?The International CLL-IPI working group has proposed an international prognostic index for patients with CLL, which incorporates data regarding disease stage, biology, and patient-related factors. This staging system may help to assign patients with higher or lower risk CLL to particular interventions in clinical trials in order to get homogeneous study groups.  The scoring system was developed and validated using data from [id number] previously untreated patients enrolled on one of eight phase 3 clinical trials completed between 1997 and 2007 [123]. Five factors were identified as independent predictors of short survival on multivariate analysis and assigned a weighted score:  ?Del(17p) or TP53 mutation - 4 points  ?Serum B2M >3.5 mg/L - 2 points   ?Unmutated IGHV - 2 points  ?Rai stage I to IV (table 1) or Binet stage B or C (table 2) - 1 point  ?Age >65 years - 1 point  Analysis of these five factors resulted in a final prognostic score, which was able to identify four risk groups with significantly different estimated rates of OS in the internal validation dataset:  ?Low risk (0 to 1 points) - 91 and 87 percent five- and ten-year OS; median not reached  ?Intermediate risk (2 to 3 points) - 80 and 40 percent five- and ten-year OS; median 104 months  ?High risk (4 to 6 points) - 53 and 16 percent five- and ten-year OS; median 63 months  ?Very high risk (7 to 10 points) - 19 and 0 percent five- and ten-year OS; median 31 months  The type of first-line treatment was not identified as an independent factor in this analysis; however, the studies did not include treatment with novel oral inhibitors (eg, idelalisib, ibrutinib, venetoclax), which have demonstrated activity in patients with del(17p) or TP53 mutations. The ability of this scoring system to prognosticate OS and time to first treatment has also been validated in independent cohorts of patients with newly diagnosed and previously untreated patients with CLL     I reviewed the CLL-IPI prognostic group and I also discussed the survival data as above.  She falls in the low risk category with excellent prognosis.  I also reviewed the criteria for treatment and there is no indication for treatment at this point.      Labs from 05/10/2020 revealed that the WBC is slightly worse.  Hemoglobin and platelet counts are normal.    WBC worse at 27.9 on 08/09/2020.  Hemoglobin 14.1 and platelet count 195.  Plan for follow-up CBC in 3 months.  WBC worse at 34.7 on 11/12/2020.  Hemoglobin normal at 14.2 and platelet count normal at 204.    She noticed a new lymph node in the right cervical region on 12/27/2020 and hence she came in for an urgent care visit on 12/27/2020.  She reports that it is mildly painful but she denies any sore throat fever or infections.  She thinks she has a bad tooth and have advised her to see her dentist.  I advised her to keep a watch on this and call again if this any significant worsening.  I also gave her  the option of another biopsy and she would like to defer it for now.    I discussed with the patient in detail on 02/25/2021.  She mentions that she had a course of antibiotics at home when she took it in the lymph node shrunk and resolved very quickly.  She subsequently saw her dentist who did not find any abnormalities.  She has not had any recurrence of this right cervical lymph node enlargement.    Labs on 05/09/2021 reveals a WBC of 40.3 which is worse than before.  85% lymphocytes.  Normal hemoglobin at 13.5 and a platelet count 204.  No indications to initiate treatment for CLL.    I will check CBC in 3 months    2.  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    I received a note from patient's ENT physician Dr. Aniceto Boss who discussed with the pathologist again to make sure that this lymph node in the neck is not something different from her manifestation of CLL.  The pathologist confirmed that it is unlikely to be 2 different pathologies and hence agreed to cancel excisional biopsy of the left neck lymph node and proceed with bone marrow biopsy.    3.  s/p Shingrix vaccine, she has second dose pending for which she will follow-up with her primary care physician.    CBC w diff  CBC with Diff Latest Ref Rng & Units 05/09/2021 02/18/2021 12/27/2020 11/12/2020 08/09/2020   WBC 4.5 - 11.0 K/UL 40.3(H) 34.4(H) 38.1(H) 34.7(H) 27.9(H)   RBC 4.0 - 5.0 M/UL 4.32 4.22 4.51 4.60 4.38   HGB 12.0 - 15.0 GM/DL 16.1 09.6 04.5 40.9 81.1   HCT 36 - 45 % 41.6 40.2 42.8 44.3 41.7   MCV 80 - 100 FL 96.3 95.3 94.9 96.5 95.3   MCH 26 - 34 PG 31.2 31.5 30.8 30.9 32.2   MCHC 32.0 - 36.0 G/DL 91.4 78.2 95.6 21.3 08.6   RDW 11 - 15 % 13.7 13.2 13.1 13.7 13.7   PLT 150 - 400 K/UL 204 187 214 204 195   MPV 7 - 11 FL 9.0 8.9 8.6 8.7 8.7   NEUT 41 - 77 % 12(L) 12(L) 10(L) 11(L) 15(L)   ANC 1.8 - 7.0 K/UL 4.91 4.09 3.68 3.63 4.09   LYMA 24 - 44 % 85(H) 84(H) 87(H) 87(H) 82(H)   ALYM 1.0 - 4.8 K/UL 34.27(H) 29.22(H) 33.42(H) 30.13(H) 22.99(H)   MONA 4 - 12 % 2(L) 3(L) 2(L) 2(L) 2(L)   AMONO 0 - 0.80 K/UL 0.82(H) 0.86(H) 0.69 0.67 0.62   EOSA 0 - 5 % 1 1 1  0 1   AEOS 0 - 0.45 K/UL 0.25 0.17 0.18 0.13 0.13   BASA 0 - 2 % 0 0 0 0 0   ABAS 0 - 0.20 K/UL 0.04 0.09 0.11 0.14 0.07

## 2021-08-01 ENCOUNTER — Encounter: Admit: 2021-08-01 | Discharge: 2021-08-01 | Payer: 59

## 2021-08-06 ENCOUNTER — Encounter: Admit: 2021-08-06 | Discharge: 2021-08-06 | Payer: 59

## 2021-08-27 ENCOUNTER — Encounter: Admit: 2021-08-27 | Discharge: 2021-08-27 | Payer: 59

## 2021-08-27 DIAGNOSIS — C9110 Chronic lymphocytic leukemia of B-cell type not having achieved remission: Secondary | ICD-10-CM

## 2021-08-27 LAB — CBC AND DIFF
ABSOLUTE BASO COUNT: 0 K/UL (ref 0–0.20)
ABSOLUTE EOS COUNT: 0.1 K/UL (ref 0–0.45)
ABSOLUTE LYMPH COUNT: 40 K/UL — ABNORMAL HIGH (ref 1.0–4.8)
ABSOLUTE MONO COUNT: 0.7 K/UL (ref 0–0.80)
ABSOLUTE NEUTROPHIL: 5.8 K/UL (ref 1.8–7.0)
BASOPHILS %: 0 % (ref 0–2)
EOSINOPHILS %: 0 % (ref 0–5)
HEMATOCRIT: 37 % (ref 36–45)
HEMOGLOBIN: 12 g/dL (ref 12.0–15.0)
LYMPHOCYTES %: 86 % — ABNORMAL HIGH (ref 24–44)
MCH: 31 pg (ref 26–34)
MCHC: 32 g/dL (ref 32.0–36.0)
MCV: 95 FL (ref 80–100)
MONOCYTES %: 2 % — ABNORMAL LOW (ref 4–12)
MPV: 8.7 FL (ref 7–11)
NEUTROPHILS %: 12 % — ABNORMAL LOW (ref 41–77)
PLATELET COUNT: 187 K/UL (ref 150–400)
RBC COUNT: 3.9 M/UL — ABNORMAL LOW (ref 4.0–5.0)
RDW: 13 % (ref 11–15)
WBC COUNT: 47 K/UL — ABNORMAL HIGH (ref 4.5–11.0)

## 2021-08-29 ENCOUNTER — Encounter: Admit: 2021-08-29 | Discharge: 2021-08-29 | Payer: 59

## 2021-08-29 DIAGNOSIS — C9110 Chronic lymphocytic leukemia of B-cell type not having achieved remission: Secondary | ICD-10-CM

## 2021-08-29 DIAGNOSIS — M1990 Unspecified osteoarthritis, unspecified site: Secondary | ICD-10-CM

## 2021-08-29 NOTE — Progress Notes
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    History of Present Illness  Lindsey Brown is a 65 y.o. female.    Lindsey Brown is a 65 year old female presenting with leukocytosis and lymphadenopathy in her neck since 2019. She had one FNA on a lymph node 01/09/2020 in her neck that per her was inconclusive with evidence of predominantly small lymphocytes raising the possibility of a neoplastic process, so ENT had recommended for an excisional lymph node biopsy at [facility]. She was also treated with a two week course of doxycycline which did not bring down the size of her lymph nodes. She requested a second opinion on if having an excisional lymph node biopsy is the best next step and would like hem/onc opinion before moving forward. Labs are attached to the navigation e-mail. WBC in Jan 2021: 28.6, ALC: 16109.  WBC 28.6, hemoglobin 14.4, platelet count 229 and absolute lymphocyte count 22.3. Peripheral smear revealed leukocytosis, lymphocytosis and atypical lymphocytes.    CBC on 01/02/2020 revealed a WBC of 25.9, hemoglobin 14.3 and a platelet count of 266.    No history of fevers chills or night sweats.  No loss of weight or loss of appetite.  No nosebleeds or gum bleeding.  No hematemesis melena or urticaria.  No hemoptysis or hematuria.    Review of old records indicates that she had leukocytosis on 05/12/2018 with a WBC of 12.3, hemoglobin 14.1 and a platelet count of 231.  Absolute lymphocyte count was 6027.    Bone marrow biopsy performed at [facility] on 01/26/2020 reveals hypercellular marrow with trilineage hematopoiesis and 60% involvement by CLL.  CLL FISH panel unremarkable, and I GVH sequencing has been ordered.  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  Cytogenetics revealed a normal karyotype.    She noticed a new lymph node in the right cervical region on 12/27/2020 and hence she came in for an urgent care visit on 12/27/2020.  She reports that it is mildly painful but she denies any sore throat fever or infections.  She thinks she has a bad tooth and have advised her to see her dentist.    She established with me on 01/23/2020 at [facility] heme-onc clinic.    Interval History: No fever chills or night sweats.  No chest pain or shortness of breath.  No nausea vomiting.    Family history: Sister has cutaneous T-cell lymphoma.    Medical History:   Diagnosis Date   ? Arthritis      Surgical History:   Procedure Laterality Date   ? COLONOSCOPY       Family History   Problem Relation Age of Onset   ? Arthritis-rheumatoid Mother    ? Cancer Sister    ? Heart Disease Sister    ? Thyroid Disease Sister    ? Asthma Son    ? Diabetes Son      Social History     Socioeconomic History   ? Marital status: Married   Tobacco Use   ? Smoking status: Former Smoker     Quit date: 1988     Years since quitting: 34.7   ? Smokeless tobacco: Never Used   Substance and Sexual Activity   ? Alcohol use: Yes   ? Drug use: Never  Review of Systems   Constitutional: Negative.    HENT: Negative.    Eyes: Negative.    Respiratory: Negative.    Cardiovascular: Negative.    Gastrointestinal: Negative.    Genitourinary: Negative.    Musculoskeletal: Negative.    Skin: Negative.    Neurological: Negative.    Psychiatric/Behavioral: Negative.          Objective:         ? ALPRAZolam (XANAX) 0.5 mg tablet Take 0.5 mg by mouth daily.   ? atorvastatin (LIPITOR) 10 mg tablet TAKE 1 TABLET BY MOUTH ONCE DAILY FOR 90 DAYS     Vitals:    08/29/21 1409   BP: 127/87   BP Source: Arm, Left Upper   Pulse: 85   Temp: 36.1 ?C (97 ?F)   Resp: 18   SpO2: 97%   TempSrc: Temporal   PainSc: Zero   Weight: 82.3 kg (181 lb 8 oz)   Height: 171.6 cm (5' 7.56)     Body mass index is 27.96 kg/m?Marland Kitchen     Physical Exam  Constitutional:       Appearance: She is well-developed.   HENT:      Head: Normocephalic and atraumatic.   Cardiovascular:      Rate and Rhythm: Normal rate.      Heart sounds: Normal heart sounds.   Pulmonary:      Effort: Pulmonary effort is normal.      Breath sounds: Normal breath sounds.   Abdominal:      Palpations: Abdomen is soft.      Tenderness: There is no abdominal tenderness.   Musculoskeletal:         General: No tenderness.      Cervical back: Neck supple.   Skin:     General: Skin is warm and dry.   Neurological:      Mental Status: She is alert and oriented to person, place, and time.       Lymphatics: Left cervical lymph node measuring 2 cm palpable.  No other lymphadenopathy.  No hepatosplenomegaly.     Assessment and Plan:    1.  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  CLL-IPI Low risk: 91 and 87 percent five- and ten-year OS; median not reached.    Bone marrow biopsy performed at [facility] on 01/26/2020 reveals hypercellular marrow with trilineage hematopoiesis and 60% involvement by CLL.  CLL FISH panel and I GVH sequencing has been ordered.  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  Cytogenetics revealed a normal karyotype.  Fish studies on the bone marrow is negative for deletion of MYB, ATM, TP53, RB1, 13q14.3, and lamp 1, for trisomy 12, IGH-CCND1 (11;14) fusion, and for IGH rearrangement.  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  B2 microglobulin 2.5 on 02/16/2020.     Leukocytosis with absolute lymphocytosis and left cervical lymphadenopathy since May 2019.    WBC 12.3, hemoglobin 14.1 and platelet count 231 on 05/12/2018.  WBC 28.6, hemoglobin 14.4 and platelet count 229 on 01/12/2020.  WBC 27.0, hemoglobin 13.9 and platelet count 237 on 01/26/2020.  WBC 24.6 on 02/16/2020.  Hemoglobin 14.1 and platelet count 228.  CBC on 05/10/2020 reveals hemoglobin 13.6, WBC 27.4 and platelet count of 191.  Absolute lymphocyte count is 19.2.  WBC worse at 34.7 on 11/12/2020.  Hemoglobin normal at 14.2 and platelet count normal at 204.  She had one FNA on a lymph node 01/09/2020 in her neck that per her was inconclusive with evidence of predominantly small lymphocytes raising the possibility of a neoplastic process, so ENT had recommended for an excisional lymph node biopsy.    Discussed in detail prognosis and treatment options of CLL and I explained in detail and I also discussed the role of chemotherapy in patients with CLL.      CLL-IPI?--?The International CLL-IPI working group has proposed an international prognostic index for patients with CLL, which incorporates data regarding disease stage, biology, and patient-related factors. This staging system may help to assign patients with higher or lower risk CLL to particular interventions in clinical trials in order to get homogeneous study groups.  The scoring system was developed and validated using data from [id number] previously untreated patients enrolled on one of eight phase 3 clinical trials completed between 1997 and 2007 [123]. Five factors were identified as independent predictors of short survival on multivariate analysis and assigned a weighted score:  ?Del(17p) or TP53 mutation - 4 points  ?Serum B2M >3.5 mg/L - 2 points   ?Unmutated IGHV - 2 points  ?Rai stage I to IV (table 1) or Binet stage B or C (table 2) - 1 point  ?Age >65 years - 1 point  Analysis of these five factors resulted in a final prognostic score, which was able to identify four risk groups with significantly different estimated rates of OS in the internal validation dataset:  ?Low risk (0 to 1 points) - 91 and 87 percent five- and ten-year OS; median not reached  ?Intermediate risk (2 to 3 points) - 80 and 40 percent five- and ten-year OS; median 104 months  ?High risk (4 to 6 points) - 53 and 16 percent five- and ten-year OS; median 63 months  ?Very high risk (7 to 10 points) - 19 and 0 percent five- and ten-year OS; median 31 months  The type of first-line treatment was not identified as an independent factor in this analysis; however, the studies did not include treatment with novel oral inhibitors (eg, idelalisib, ibrutinib, venetoclax), which have demonstrated activity in patients with del(17p) or TP53 mutations. The ability of this scoring system to prognosticate OS and time to first treatment has also been validated in independent cohorts of patients with newly diagnosed and previously untreated patients with CLL     I reviewed the CLL-IPI prognostic group and I also discussed the survival data as above.  She falls in the low risk category with excellent prognosis.  I also reviewed the criteria for treatment and there is no indication for treatment at this point.      Labs from 05/10/2020 revealed that the WBC is slightly worse.  Hemoglobin and platelet counts are normal.    WBC worse at 27.9 on 08/09/2020.  Hemoglobin 14.1 and platelet count 195.  Plan for follow-up CBC in 3 months.  WBC worse at 34.7 on 11/12/2020.  Hemoglobin normal at 14.2 and platelet count normal at 204.    She noticed a new lymph node in the right cervical region on 12/27/2020 and hence she came in for an urgent care visit on 12/27/2020.  She reports that it is mildly painful but she denies any sore throat fever or infections.  She thinks she has a bad tooth and have advised her to see her dentist.  I advised her to keep a watch on this and call again if this any significant worsening.  I also  gave her the option of another biopsy and she would like to defer it for now.    I discussed with the patient in detail on 02/25/2021.  She mentions that she had a course of antibiotics at home when she took it in the lymph node shrunk and resolved very quickly.  She subsequently saw her dentist who did not find any abnormalities.  She has not had any recurrence of this right cervical lymph node enlargement.    Labs on 05/09/2021 reveals a WBC of 40.3 which is worse than before.  85% lymphocytes.  Normal hemoglobin at 13.5 and a platelet count 204.   WBC worse at 47.5 on 08/27/2021.  Hemoglobin stable at 12.3 and platelet count 187.   No indications to initiate treatment for CLL.    I will check CBC in 3 months    2.  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    I received a note from patient's ENT physician Dr. Aniceto Boss who discussed with the pathologist again to make sure that this lymph node in the neck is not something different from her manifestation of CLL.  The pathologist confirmed that it is unlikely to be 2 different pathologies and hence agreed to cancel excisional biopsy of the left neck lymph node and proceed with bone marrow biopsy.    3.  s/p Shingrix vaccine, she has second dose pending for which she will follow-up with her primary care physician.    CBC w diff  CBC with Diff Latest Ref Rng & Units 08/27/2021 05/09/2021 02/18/2021 12/27/2020 11/12/2020   WBC 4.5 - 11.0 K/UL 47.5(H) 40.3(H) 34.4(H) 38.1(H) 34.7(H)   RBC 4.0 - 5.0 M/UL 3.96(L) 4.32 4.22 4.51 4.60   HGB 12.0 - 15.0 GM/DL 36.6 44.0 34.7 42.5 95.6   HCT 36 - 45 % 37.9 41.6 40.2 42.8 44.3   MCV 80 - 100 FL 95.8 96.3 95.3 94.9 96.5   MCH 26 - 34 PG 31.2 31.2 31.5 30.8 30.9   MCHC 32.0 - 36.0 G/DL 38.7 56.4 33.2 95.1 88.4   RDW 11 - 15 % 13.9 13.7 13.2 13.1 13.7   PLT 150 - 400 K/UL 187 204 187 214 204   MPV 7 - 11 FL 8.7 9.0 8.9 8.6 8.7   NEUT 41 - 77 % 12(L) 12(L) 12(L) 10(L) 11(L)   ANC 1.8 - 7.0 K/UL 5.87 4.91 4.09 3.68 3.63   LYMA 24 - 44 % 86(H) 85(H) 84(H) 87(H) 87(H)   ALYM 1.0 - 4.8 K/UL 40.67(H) 34.27(H) 29.22(H) 33.42(H) 30.13(H)   MONA 4 - 12 % 2(L) 2(L) 3(L) 2(L) 2(L)   AMONO 0 - 0.80 K/UL 0.75 0.82(H) 0.86(H) 0.69 0.67   EOSA 0 - 5 % 0 1 1 1  0   AEOS 0 - 0.45 K/UL 0.18 0.25 0.17 0.18 0.13   BASA 0 - 2 % 0 0 0 0 0   ABAS 0 - 0.20 K/UL 0.04 0.04 0.09 0.11 0.14

## 2021-11-21 ENCOUNTER — Encounter: Admit: 2021-11-21 | Discharge: 2021-11-21 | Payer: 59

## 2021-11-21 DIAGNOSIS — C9110 Chronic lymphocytic leukemia of B-cell type not having achieved remission: Secondary | ICD-10-CM

## 2021-11-21 LAB — CBC AND DIFF
ABSOLUTE BASO COUNT: 0.1 K/UL (ref 0–0.20)
ABSOLUTE EOS COUNT: 0.1 K/UL (ref 0–0.45)
ABSOLUTE MONO COUNT: 1.3 K/UL — ABNORMAL HIGH (ref 0–0.80)
HEMATOCRIT: 40 % (ref 36–45)
HEMOGLOBIN: 13 g/dL (ref 12.0–15.0)
MCH: 31 pg (ref 26–34)
MCHC: 32 g/dL (ref 32.0–36.0)
MCV: 96 FL (ref 80–100)
RBC COUNT: 4.2 M/UL (ref 4.0–5.0)
WBC COUNT: 46 K/UL — ABNORMAL HIGH (ref 4.5–11.0)

## 2021-12-05 ENCOUNTER — Encounter: Admit: 2021-12-05 | Discharge: 2021-12-05 | Payer: 59

## 2021-12-05 DIAGNOSIS — C9110 Chronic lymphocytic leukemia of B-cell type not having achieved remission: Secondary | ICD-10-CM

## 2021-12-05 DIAGNOSIS — M1990 Unspecified osteoarthritis, unspecified site: Secondary | ICD-10-CM

## 2021-12-05 NOTE — Progress Notes
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    History of Present Illness  Lindsey Brown is a 65 y.o. female.    Lindsey Brown is a 65 year old female presenting with leukocytosis and lymphadenopathy in her neck since 2019. She had one FNA on a lymph node 01/09/2020 in her neck that per her was inconclusive with evidence of predominantly small lymphocytes raising the possibility of a neoplastic process, so ENT had recommended for an excisional lymph node biopsy at [facility] [address]. She was also treated with a two week course of doxycycline which did not bring down the size of her lymph nodes. She requested a second opinion on if having an excisional lymph node biopsy is the best next step and would like hem/onc opinion before moving forward. Labs are attached to the navigation e-mail. WBC in Jan 2021: 28.6, ALC: 16109.  WBC 28.6, hemoglobin 14.4, platelet count 229 and absolute lymphocyte count 22.3. Peripheral smear revealed leukocytosis, lymphocytosis and atypical lymphocytes.    CBC on 01/02/2020 revealed a WBC of 25.9, hemoglobin 14.3 and a platelet count of 266.    No history of fevers chills or night sweats.  No loss of weight or loss of appetite.  No nosebleeds or gum bleeding.  No hematemesis melena or urticaria.  No hemoptysis or hematuria.    Review of old records indicates that she had leukocytosis on 05/12/2018 with a WBC of 12.3, hemoglobin 14.1 and a platelet count of 231.  Absolute lymphocyte count was 6027.    Bone marrow biopsy performed at [facility] on 01/26/2020 reveals hypercellular marrow with trilineage hematopoiesis and 60% involvement by CLL.  CLL FISH panel unremarkable, and I GVH sequencing has been ordered.  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  Cytogenetics revealed a normal karyotype.    She noticed a new lymph node in the right cervical region on 12/27/2020 and hence she came in for an urgent care visit on 12/27/2020.  She reports that it is mildly painful but she denies any sore throat fever or infections.  She thinks she has a bad tooth and have advised her to see her dentist.    She established with me on 01/23/2020 at [facility] heme-onc clinic.    Interval History: No fever chills or night sweats.  No chest pain or shortness of breath.  No nausea vomiting.    Family history: Sister has cutaneous T-cell lymphoma.    Medical History:   Diagnosis Date   ? Arthritis      Surgical History:   Procedure Laterality Date   ? COLONOSCOPY       Family History   Problem Relation Age of Onset   ? Arthritis-rheumatoid Mother    ? Cancer Sister    ? Heart Disease Sister    ? Thyroid Disease Sister    ? Asthma Son    ? Diabetes Son      Social History     Socioeconomic History   ? Marital status: Married   Tobacco Use   ? Smoking status: Former     Types: Cigarettes     Quit date: 1988     Years since quitting: 34.9   ? Smokeless tobacco: Never   Substance and Sexual Activity   ? Alcohol use: Yes   ? Drug use: Never  Review of Systems   Constitutional: Negative.    HENT: Negative.    Eyes: Negative.    Respiratory: Negative.    Cardiovascular: Negative.    Gastrointestinal: Negative.    Genitourinary: Negative.    Musculoskeletal: Negative.    Skin: Negative.    Neurological: Negative.    Psychiatric/Behavioral: Negative.      Objective:         ? ALPRAZolam (XANAX) 0.5 mg tablet Take 0.5 mg by mouth daily.   ? atorvastatin (LIPITOR) 10 mg tablet TAKE 1 TABLET BY MOUTH ONCE DAILY FOR 90 DAYS   ? celecoxib (CELEBREX) 200 mg capsule      Vitals:    12/05/21 1349   BP: 138/69   BP Source: Arm, Left Upper   Pulse: 91   Temp: 36.6 ?C (97.9 ?F)   Resp: 18   SpO2: 98%   TempSrc: Temporal   PainSc: Zero   Weight: 81.7 kg (180 lb 3.2 oz)   Height: 171.6 cm (5' 7.56)     Body mass index is 27.76 kg/m?Marland Kitchen Physical Exam  Constitutional:       Appearance: She is well-developed.   HENT:      Head: Normocephalic and atraumatic.   Cardiovascular:      Rate and Rhythm: Normal rate.      Heart sounds: Normal heart sounds.   Pulmonary:      Effort: Pulmonary effort is normal.      Breath sounds: Normal breath sounds.   Abdominal:      Palpations: Abdomen is soft.      Tenderness: There is no abdominal tenderness.   Musculoskeletal:         General: No tenderness.      Cervical back: Neck supple.   Skin:     General: Skin is warm and dry.   Neurological:      Mental Status: She is alert and oriented to person, place, and time.       Lymphatics: Left cervical lymph node measuring 2 cm palpable.  No other lymphadenopathy.  No hepatosplenomegaly.     Assessment and Plan:    1.  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  CLL-IPI Low risk: 91 and 87 percent five- and ten-year OS; median not reached.    Bone marrow biopsy performed at [facility] on 01/26/2020 reveals hypercellular marrow with trilineage hematopoiesis and 60% involvement by CLL.  CLL FISH panel and I GVH sequencing has been ordered.  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  Cytogenetics revealed a normal karyotype.  Fish studies on the bone marrow is negative for deletion of MYB, ATM, TP53, RB1, 13q14.3, and lamp 1, for trisomy 12, IGH-CCND1 (11;14) fusion, and for IGH rearrangement.  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  B2 microglobulin 2.5 on 02/16/2020.     Leukocytosis with absolute lymphocytosis and left cervical lymphadenopathy since May 2019.    WBC 12.3, hemoglobin 14.1 and platelet count 231 on 05/12/2018.  WBC 28.6, hemoglobin 14.4 and platelet count 229 on 01/12/2020.  WBC 27.0, hemoglobin 13.9 and platelet count 237 on 01/26/2020.  WBC 24.6 on 02/16/2020.  Hemoglobin 14.1 and platelet count 228.  CBC on 05/10/2020 reveals hemoglobin 13.6, WBC 27.4 and platelet count of 191.  Absolute lymphocyte count is 19.2.  WBC worse at 34.7 on 11/12/2020.  Hemoglobin normal at 14.2 and platelet count normal at 204.  She had one FNA on a lymph node 01/09/2020 in her neck that per her was inconclusive with evidence of predominantly small lymphocytes raising the possibility of a neoplastic process, so ENT had recommended for an excisional lymph node biopsy.    Discussed in detail prognosis and treatment options of CLL and I explained in detail and I also discussed the role of chemotherapy in patients with CLL.      CLL-IPI?--?The International CLL-IPI working group has proposed an international prognostic index for patients with CLL, which incorporates data regarding disease stage, biology, and patient-related factors. This staging system may help to assign patients with higher or lower risk CLL to particular interventions in clinical trials in order to get homogeneous study groups.  The scoring system was developed and validated using data from [id number] previously untreated patients enrolled on one of eight phase 3 clinical trials completed between 1997 and 2007 [123]. Five factors were identified as independent predictors of short survival on multivariate analysis and assigned a weighted score:  ?Del(17p) or TP53 mutation - 4 points  ?Serum B2M >3.5 mg/L - 2 points   ?Unmutated IGHV - 2 points  ?Rai stage I to IV (table 1) or Binet stage B or C (table 2) - 1 point  ?Age >65 years - 1 point  Analysis of these five factors resulted in a final prognostic score, which was able to identify four risk groups with significantly different estimated rates of OS in the internal validation dataset:  ?Low risk (0 to 1 points) - 91 and 87 percent five- and ten-year OS; median not reached  ?Intermediate risk (2 to 3 points) - 80 and 40 percent five- and ten-year OS; median 104 months  ?High risk (4 to 6 points) - 53 and 16 percent five- and ten-year OS; median 63 months  ?Very high risk (7 to 10 points) - 19 and 0 percent five- and ten-year OS; median 31 months  The type of first-line treatment was not identified as an independent factor in this analysis; however, the studies did not include treatment with novel oral inhibitors (eg, idelalisib, ibrutinib, venetoclax), which have demonstrated activity in patients with del(17p) or TP53 mutations. The ability of this scoring system to prognosticate OS and time to first treatment has also been validated in independent cohorts of patients with newly diagnosed and previously untreated patients with CLL     I reviewed the CLL-IPI prognostic group and I also discussed the survival data as above.  She falls in the low risk category with excellent prognosis.  I also reviewed the criteria for treatment and there is no indication for treatment at this point.      Labs from 05/10/2020 revealed that the WBC is slightly worse.  Hemoglobin and platelet counts are normal.    WBC worse at 27.9 on 08/09/2020.  Hemoglobin 14.1 and platelet count 195.  Plan for follow-up CBC in 3 months.  WBC worse at 34.7 on 11/12/2020.  Hemoglobin normal at 14.2 and platelet count normal at 204.    She noticed a new lymph node in the right cervical region on 12/27/2020 and hence she came in for an urgent care visit on 12/27/2020.  She reports that it is mildly painful but she denies any sore throat fever or infections.  She thinks she has a bad tooth and have advised her to see her dentist.  I advised her to keep a watch on this and call again if this any significant worsening.  I also  gave her the option of another biopsy and she would like to defer it for now.    I discussed with the patient in detail on 02/25/2021.  She mentions that she had a course of antibiotics at home when she took it in the lymph node shrunk and resolved very quickly.  She subsequently saw her dentist who did not find any abnormalities.  She has not had any recurrence of this right cervical lymph node enlargement.    Labs on 05/09/2021 reveals a WBC of 40.3 which is worse than before.  85% lymphocytes.  Normal hemoglobin at 13.5 and a platelet count 204.   WBC worse at 47.5 on 08/27/2021.  Hemoglobin stable at 12.3 and platelet count 187.  WBC stable at 46.7 on 11/21/2021.  Hemoglobin 13.4 and platelet count 200.  No indications to initiate treatment for CLL.    I will check CBC in 3 months.  She reports bilateral shoulder pains.  She had [facility] done at [facility].  Obtain records.  I reassured her that there is not related to the 2 cm lymph node in the left side of her neck and it is also unrelated to underlying CLL.  I have advised her to follow-up with her PCP for management.  She will return for follow-up in 3 months.    2.  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    I received a note from patient's ENT physician Dr. Aniceto Boss who discussed with the pathologist again to make sure that this lymph node in the neck is not something different from her manifestation of CLL.  The pathologist confirmed that it is unlikely to be 2 different pathologies and hence agreed to cancel excisional biopsy of the left neck lymph node and proceed with bone marrow biopsy 01/26/20.    3.  s/p Shingrix vaccine, she has second dose pending for which she will follow-up with her primary care physician.    CBC w diff  CBC with Diff Latest Ref Rng & Units 11/21/2021 08/27/2021 05/09/2021 02/18/2021 12/27/2020   WBC 4.5 - 11.0 K/UL 46.7(H) 47.5(H) 40.3(H) 34.4(H) 38.1(H)   RBC 4.0 - 5.0 M/UL 4.24 3.96(L) 4.32 4.22 4.51   HGB 12.0 - 15.0 GM/DL 08.6 57.8 46.9 62.9 52.8   HCT 36 - 45 % 40.9 37.9 41.6 40.2 42.8   MCV 80 - 100 FL 96.6 95.8 96.3 95.3 94.9   MCH 26 - 34 PG 31.5 31.2 31.2 31.5 30.8   MCHC 32.0 - 36.0 G/DL 41.3 24.4 01.0 27.2 53.6   RDW 11 - 15 % 13.9 13.9 13.7 13.2 13.1   PLT 150 - 400 K/UL 200 187 204 187 214   MPV 7 - 11 FL 9.2 8.7 9.0 8.9 8.6   NEUT 41 - 77 % 10(L) 12(L) 12(L) 12(L) 10(L)   ANC 1.8 - 7.0 K/UL 4.51 5.87 4.91 4.09 3.68   LYMA 24 - 44 % 87(H) 86(H) 85(H) 84(H) 87(H)   ALYM 1.0 - 4.8 K/UL 40.61(H) 40.67(H) 34.27(H) 29.22(H) 33.42(H)   MONA 4 - 12 % 3(L) 2(L) 2(L) 3(L) 2(L)   AMONO 0 - 0.80 K/UL 1.33(H) 0.75 0.82(H) 0.86(H) 0.69   EOSA 0 - 5 % 0 0 1 1 1    AEOS 0 - 0.45 K/UL 0.12 0.18 0.25 0.17 0.18   BASA 0 - 2 % 0 0 0 0 0   ABAS 0 - 0.20 K/UL 0.13 0.04 0.04 0.09 0.11

## 2022-02-05 ENCOUNTER — Encounter: Admit: 2022-02-05 | Discharge: 2022-02-05 | Payer: 59

## 2022-02-25 ENCOUNTER — Encounter: Admit: 2022-02-25 | Discharge: 2022-02-25 | Payer: 59

## 2022-02-27 ENCOUNTER — Encounter: Admit: 2022-02-27 | Discharge: 2022-02-27 | Payer: 59

## 2022-02-27 LAB — CBC AND DIFF
ABSOLUTE BASO COUNT: 0.1 K/UL (ref 0–0.20)
ABSOLUTE EOS COUNT: 0.1 K/UL (ref 0–0.45)
ABSOLUTE LYMPH COUNT: 49 K/UL — ABNORMAL HIGH (ref 1.0–4.8)
ABSOLUTE MONO COUNT: 0.5 K/UL (ref 0–0.80)
ABSOLUTE NEUTROPHIL: 4.7 K/UL (ref 1.8–7.0)
BASOPHILS %: 0 % (ref 0–2)
EOSINOPHILS %: 0 % (ref 0–5)
HEMATOCRIT: 40 % (ref 36–45)
HEMOGLOBIN: 13 g/dL (ref 12.0–15.0)
LYMPHOCYTES %: 90 % — ABNORMAL HIGH (ref 24–44)
MCH: 31 pg (ref 26–34)
MCHC: 33 g/dL (ref 32.0–36.0)
MCV: 96 FL (ref 80–100)
MONOCYTES %: 1 % — ABNORMAL LOW (ref 4–12)
MPV: 9.4 FL (ref 7–11)
NEUTROPHILS %: 9 % — ABNORMAL LOW (ref 41–77)
PLATELET COUNT: 204 K/UL (ref 150–400)
RBC COUNT: 4.1 M/UL (ref 4.0–5.0)
RDW: 13 % (ref 11–15)
WBC COUNT: 54 K/UL — ABNORMAL HIGH (ref 4.5–11.0)

## 2022-03-10 ENCOUNTER — Encounter: Admit: 2022-03-10 | Discharge: 2022-03-10 | Payer: 59

## 2022-03-10 DIAGNOSIS — C9110 Chronic lymphocytic leukemia of B-cell type not having achieved remission: Secondary | ICD-10-CM

## 2022-03-10 DIAGNOSIS — M1990 Unspecified osteoarthritis, unspecified site: Secondary | ICD-10-CM

## 2022-03-10 NOTE — Progress Notes
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    History of Present Illness  Lindsey Brown is a 66 y.o. female.    Lindsey Brown is a 66 year old female presenting with leukocytosis and lymphadenopathy in her neck since 2019. She had one FNA on a lymph node 01/09/2020 in her neck that per her was inconclusive with evidence of predominantly small lymphocytes raising the possibility of a neoplastic process, so ENT had recommended for an excisional lymph node biopsy at [facility]. She was also treated with a two week course of doxycycline which did not bring down the size of her lymph nodes. She requested a second opinion on if having an excisional lymph node biopsy is the best next step and would like hem/onc opinion before moving forward. Labs are attached to the navigation e-mail. WBC in Jan 2021: 28.6, ALC: 16109.  WBC 28.6, hemoglobin 14.4, platelet count 229 and absolute lymphocyte count 22.3. Peripheral smear revealed leukocytosis, lymphocytosis and atypical lymphocytes.    CBC on 01/02/2020 revealed a WBC of 25.9, hemoglobin 14.3 and a platelet count of 266.    No history of fevers chills or night sweats.  No loss of weight or loss of appetite.  No nosebleeds or gum bleeding.  No hematemesis melena or urticaria.  No hemoptysis or hematuria.    Review of old records indicates that she had leukocytosis on 05/12/2018 with a WBC of 12.3, hemoglobin 14.1 and a platelet count of 231.  Absolute lymphocyte count was 6027.    Bone marrow biopsy performed at [facility] on 01/26/2020 reveals hypercellular marrow with trilineage hematopoiesis and 60% involvement by CLL.  CLL FISH panel unremarkable, and I GVH sequencing has been ordered.  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  Cytogenetics revealed a normal karyotype.    She noticed a new lymph node in the right cervical region on 12/27/2020 and hence she came in for an urgent care visit on 12/27/2020.  She reports that it is mildly painful but she denies any sore throat fever or infections.  She thinks she has a bad tooth and have advised her to see her dentist.    She established with me on 01/23/2020 at [facility] heme-onc clinic.    Interval History: No fever chills or night sweats.  No chest pain or shortness of breath.  No nausea vomiting.    Family history: Sister has cutaneous T-cell lymphoma.    Medical History:   Diagnosis Date   ? Arthritis      Surgical History:   Procedure Laterality Date   ? COLONOSCOPY       Family History   Problem Relation Age of Onset   ? Arthritis-rheumatoid Mother    ? Cancer Sister    ? Heart Disease Sister    ? Thyroid Disease Sister    ? Asthma Son    ? Diabetes Son      Social History     Socioeconomic History   ? Marital status: Married   Tobacco Use   ? Smoking status: Former     Types: Cigarettes     Quit date: 1988     Years since quitting: 35.2   ? Smokeless tobacco: Never   Substance and Sexual Activity   ? Alcohol use: Yes   ? Drug use: Never  Review of Systems   Constitutional: Negative.    HENT: Negative.    Eyes: Negative.    Respiratory: Negative.    Cardiovascular: Negative.    Gastrointestinal: Negative.    Genitourinary: Negative.    Musculoskeletal: Negative.    Skin: Negative.    Neurological: Negative.    Psychiatric/Behavioral: Negative.      Objective:         ? ALPRAZolam (XANAX) 0.5 mg tablet Take one tablet by mouth daily.   ? atorvastatin (LIPITOR) 10 mg tablet TAKE 1 TABLET BY MOUTH ONCE DAILY FOR 90 DAYS   ? celecoxib (CELEBREX) 200 mg capsule      Vitals:    03/10/22 1323   BP: 129/81   BP Source: Arm, Left Upper   Pulse: 86   Resp: 18   SpO2: 98%   PainSc: Zero   Weight: 81.6 kg (180 lb)   Height: 171.6 cm (5' 7.56)     Body mass index is 27.73 kg/m?Marland Kitchen     Physical Exam  Constitutional:       Appearance: She is well-developed.   HENT:      Head: Normocephalic and atraumatic.   Cardiovascular:      Rate and Rhythm: Normal rate.      Heart sounds: Normal heart sounds.   Pulmonary:      Effort: Pulmonary effort is normal.      Breath sounds: Normal breath sounds.   Abdominal:      Palpations: Abdomen is soft.      Tenderness: There is no abdominal tenderness.   Musculoskeletal:         General: No tenderness.      Cervical back: Neck supple.   Skin:     General: Skin is warm and dry.   Neurological:      Mental Status: She is alert and oriented to person, place, and time.       Lymphatics: Left cervical lymph node measuring 2 cm palpable.  No other lymphadenopathy.  No hepatosplenomegaly.     Assessment and Plan:    1.  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  CLL-IPI Low risk: 91 and 87 percent five- and ten-year OS; median not reached.    Bone marrow biopsy performed at [facility] on 01/26/2020 reveals hypercellular marrow with trilineage hematopoiesis and 60% involvement by CLL.  CLL FISH panel and I GVH sequencing has been ordered.  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  Cytogenetics revealed a normal karyotype.  Fish studies on the bone marrow is negative for deletion of MYB, ATM, TP53, RB1, 13q14.3, and lamp 1, for trisomy 12, IGH-CCND1 (11;14) fusion, and for IGH rearrangement.  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  B2 microglobulin 2.5 on 02/16/2020.     Leukocytosis with absolute lymphocytosis and left cervical lymphadenopathy since May 2019.    WBC 12.3, hemoglobin 14.1 and platelet count 231 on 05/12/2018.  WBC 28.6, hemoglobin 14.4 and platelet count 229 on 01/12/2020.  WBC 27.0, hemoglobin 13.9 and platelet count 237 on 01/26/2020.  WBC 24.6 on 02/16/2020.  Hemoglobin 14.1 and platelet count 228.  CBC on 05/10/2020 reveals hemoglobin 13.6, WBC 27.4 and platelet count of 191.  Absolute lymphocyte count is 19.2.  WBC worse at 34.7 on 11/12/2020.  Hemoglobin normal at 14.2 and platelet count normal at 204.    She had one FNA on a lymph node  01/09/2020 in her neck that per her was inconclusive with evidence of predominantly small lymphocytes raising the possibility of a neoplastic process, so ENT had recommended for an excisional lymph node biopsy.    Discussed in detail prognosis and treatment options of CLL and I explained in detail and I also discussed the role of chemotherapy in patients with CLL.      CLL-IPI?--?The International CLL-IPI working group has proposed an international prognostic index for patients with CLL, which incorporates data regarding disease stage, biology, and patient-related factors. This staging system may help to assign patients with higher or lower risk CLL to particular interventions in clinical trials in order to get homogeneous study groups.  The scoring system was developed and validated using data from [id number] previously untreated patients enrolled on one of eight phase 3 clinical trials completed between 1997 and 2007 [123]. Five factors were identified as independent predictors of short survival on multivariate analysis and assigned a weighted score:  ?Del(17p) or TP53 mutation - 4 points  ?Serum B2M >3.5 mg/L - 2 points   ?Unmutated IGHV - 2 points  ?Rai stage I to IV (table 1) or Binet stage B or C (table 2) - 1 point  ?Age >65 years - 1 point  Analysis of these five factors resulted in a final prognostic score, which was able to identify four risk groups with significantly different estimated rates of OS in the internal validation dataset:  ?Low risk (0 to 1 points) - 91 and 87 percent five- and ten-year OS; median not reached  ?Intermediate risk (2 to 3 points) - 80 and 40 percent five- and ten-year OS; median 104 months  ?High risk (4 to 6 points) - 53 and 16 percent five- and ten-year OS; median 63 months  ?Very high risk (7 to 10 points) - 19 and 0 percent five- and ten-year OS; median 31 months  The type of first-line treatment was not identified as an independent factor in this analysis; however, the studies did not include treatment with novel oral inhibitors (eg, idelalisib, ibrutinib, venetoclax), which have demonstrated activity in patients with del(17p) or TP53 mutations. The ability of this scoring system to prognosticate OS and time to first treatment has also been validated in independent cohorts of patients with newly diagnosed and previously untreated patients with CLL     I reviewed the CLL-IPI prognostic group and I also discussed the survival data as above.  She falls in the low risk category with excellent prognosis.  I also reviewed the criteria for treatment and there is no indication for treatment at this point.      Labs from 05/10/2020 revealed that the WBC is slightly worse.  Hemoglobin and platelet counts are normal.    WBC worse at 27.9 on 08/09/2020.  Hemoglobin 14.1 and platelet count 195.  Plan for follow-up CBC in 3 months.  WBC worse at 34.7 on 11/12/2020.  Hemoglobin normal at 14.2 and platelet count normal at 204.    She noticed a new lymph node in the right cervical region on 12/27/2020 and hence she came in for an urgent care visit on 12/27/2020.  She reports that it is mildly painful but she denies any sore throat fever or infections.  She thinks she has a bad tooth and have advised her to see her dentist.  I advised her to keep a watch on this and call again if this any significant worsening.  I also gave her the option of another biopsy and  she would like to defer it for now.    I discussed with the patient in detail on 02/25/2021.  She mentions that she had a course of antibiotics at home when she took it in the lymph node shrunk and resolved very quickly.  She subsequently saw her dentist who did not find any abnormalities.  She has not had any recurrence of this right cervical lymph node enlargement.    Labs on 05/09/2021 reveals a WBC of 40.3 which is worse than before.  85% lymphocytes.  Normal hemoglobin at 13.5 and a platelet count 204.   WBC worse at 47.5 on 08/27/2021.  Hemoglobin stable at 12.3 and platelet count 187.  WBC stable at 46.7 on 11/21/2021.  Hemoglobin 13.4 and platelet count 200.  WBC worse at 54.8 on 02/27/2022.  Hemoglobin and platelets continue to stay normal.  No indications to initiate treatment for CLL.    I will check CBC in 3 months.  She reports bilateral shoulder pains.  She had MRI done at [facility].  She continues to follow-up with PCP for management.  I reassured her that there is not related to the 2 cm lymph node in the left side of her neck and it is also unrelated to underlying CLL.  I have advised her to follow-up with her PCP for management.  She will return for follow-up in 3 months.    2.  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    I received a note from patient's ENT physician Dr. Aniceto Boss who discussed with the pathologist again to make sure that this lymph node in the neck is not something different from her manifestation of CLL.  The pathologist confirmed that it is unlikely to be 2 different pathologies and hence agreed to cancel excisional biopsy of the left neck lymph node and proceed with bone marrow biopsy 01/26/20.    3.  s/p Shingrix vaccine, she has second dose pending for which she will follow-up with her primary care physician.    CBC w diff  CBC with Diff Latest Ref Rng & Units 02/27/2022 11/21/2021 08/27/2021 05/09/2021 02/18/2021   WBC 4.5 - 11.0 K/UL 54.8(HH) 46.7(H) 47.5(H) 40.3(H) 34.4(H)   RBC 4.0 - 5.0 M/UL 4.17 4.24 3.96(L) 4.32 4.22   HGB 12.0 - 15.0 GM/DL 16.1 09.6 04.5 40.9 81.1   HCT 36 - 45 % 40.1 40.9 37.9 41.6 40.2   MCV 80 - 100 FL 96.3 96.6 95.8 96.3 95.3   MCH 26 - 34 PG 31.9 31.5 31.2 31.2 31.5   MCHC 32.0 - 36.0 G/DL 91.4 78.2 95.6 21.3 08.6   RDW 11 - 15 % 13.9 13.9 13.9 13.7 13.2   PLT 150 - 400 K/UL 204 200 187 204 187   MPV 7 - 11 FL 9.4 9.2 8.7 9.0 8.9   NEUT 41 - 77 % 9(L) 10(L) 12(L) 12(L) 12(L)   ANC 1.8 - 7.0 K/UL 4.75 4.51 5.87 4.91 4.09   LYMA 24 - 44 % 90(H) 87(H) 86(H) 85(H) 84(H)   ALYM 1.0 - 4.8 K/UL 49.21(H) 40.61(H) 40.67(H) 34.27(H) 29.22(H)   MONA 4 - 12 % 1(L) 3(L) 2(L) 2(L) 3(L)   AMONO 0 - 0.80 K/UL 0.56 1.33(H) 0.75 0.82(H) 0.86(H)   EOSA 0 - 5 % 0 0 0 1 1   AEOS 0 - 0.45 K/UL 0.13 0.12 0.18 0.25 0.17   BASA 0 - 2 % 0 0 0 0 0   ABAS 0 - 0.20 K/UL  0.13 0.13 0.04 0.04 0.09

## 2022-03-17 ENCOUNTER — Encounter: Admit: 2022-03-17 | Discharge: 2022-03-17 | Payer: 59

## 2022-05-28 ENCOUNTER — Encounter: Admit: 2022-05-28 | Discharge: 2022-05-28 | Payer: 59

## 2022-05-28 DIAGNOSIS — C9110 Chronic lymphocytic leukemia of B-cell type not having achieved remission: Secondary | ICD-10-CM

## 2022-05-28 LAB — CBC AND DIFF
ABSOLUTE BASO COUNT: 0.1 K/UL (ref 0–0.20)
ABSOLUTE EOS COUNT: 0.1 K/UL (ref 0–0.45)
ABSOLUTE LYMPH COUNT: 52 K/UL — ABNORMAL HIGH (ref 1.0–4.8)
ABSOLUTE MONO COUNT: 1.3 K/UL — ABNORMAL HIGH (ref 0–0.80)
ABSOLUTE NEUTROPHIL: 3.4 K/UL (ref 1.8–7.0)
BASOPHILS %: 0 % (ref 0–2)
EOSINOPHILS %: 0 % (ref 0–5)
HEMATOCRIT: 34 % — ABNORMAL LOW (ref 36–45)
HEMOGLOBIN: 11 g/dL — ABNORMAL LOW (ref 12.0–15.0)
LYMPHOCYTES %: 92 % — ABNORMAL HIGH (ref 24–44)
MCH: 32 pg (ref 26–34)
MCHC: 33 g/dL (ref 32.0–36.0)
MCV: 95 FL (ref 80–100)
MONOCYTES %: 2 % — ABNORMAL LOW (ref 4–12)
MPV: 8.5 FL (ref 7–11)
NEUTROPHILS %: 6 % — ABNORMAL LOW (ref 41–77)
PLATELET COUNT: 195 K/UL (ref 150–400)
RBC COUNT: 3.6 M/UL — ABNORMAL LOW (ref 4.0–5.0)
RDW: 13 % (ref 11–15)
WBC COUNT: 57 K/UL — ABNORMAL HIGH (ref 4.5–11.0)

## 2022-06-11 ENCOUNTER — Encounter: Admit: 2022-06-11 | Discharge: 2022-06-11 | Payer: 59

## 2022-06-11 DIAGNOSIS — C9110 Chronic lymphocytic leukemia of B-cell type not having achieved remission: Secondary | ICD-10-CM

## 2022-06-11 NOTE — Progress Notes
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    History of Present Illness  Lindsey Brown is a 66 y.o. female.    Lindsey Brown is a 66 year old female presenting with leukocytosis and lymphadenopathy in her neck since 2019. She had one FNA on a lymph node 01/09/2020 in her neck that per her was inconclusive with evidence of predominantly small lymphocytes raising the possibility of a neoplastic process, so ENT had recommended for an excisional lymph node biopsy at [facility]. She was also treated with a two week course of doxycycline which did not bring down the size of her lymph nodes. She requested a second opinion on if having an excisional lymph node biopsy is the best next step and would like hem/onc opinion before moving forward. Labs are attached to the navigation e-mail. WBC in Jan 2021: 28.6, ALC: 16109.  WBC 28.6, hemoglobin 14.4, platelet count 229 and absolute lymphocyte count 22.3. Peripheral smear revealed leukocytosis, lymphocytosis and atypical lymphocytes.    CBC on 01/02/2020 revealed a WBC of 25.9, hemoglobin 14.3 and a platelet count of 266.    No history of fevers chills or night sweats.  No loss of weight or loss of appetite.  No nosebleeds or gum bleeding.  No hematemesis melena or urticaria.  No hemoptysis or hematuria.    Review of old records indicates that she had leukocytosis on 05/12/2018 with a WBC of 12.3, hemoglobin 14.1 and a platelet count of 231.  Absolute lymphocyte count was 6027.    Bone marrow biopsy performed at [facility] on 01/26/2020 reveals hypercellular marrow with trilineage hematopoiesis and 60% involvement by CLL.  CLL FISH panel unremarkable, and I GVH sequencing has been ordered.  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  Cytogenetics revealed a normal karyotype.    She noticed a new lymph node in the right cervical region on 12/27/2020 and hence she came in for an urgent care visit on 12/27/2020.  She reports that it is mildly painful but she denies any sore throat fever or infections.  She thinks she has a bad tooth and have advised her to see her dentist.    She established with me on 01/23/2020 at [facility] heme-onc clinic.    Interval History: No fever chills or night sweats.  No chest pain or shortness of breath.  No nausea vomiting.    Family history: Sister has cutaneous T-cell lymphoma.    Medical History:   Diagnosis Date   ? Arthritis      Surgical History:   Procedure Laterality Date   ? COLONOSCOPY       Family History   Problem Relation Age of Onset   ? Arthritis-rheumatoid Mother    ? Cancer Sister    ? Heart Disease Sister    ? Thyroid Disease Sister    ? Asthma Son    ? Diabetes Son      Social History     Socioeconomic History   ? Marital status: Married   Tobacco Use   ? Smoking status: Former     Types: Cigarettes     Quit date: 1988     Years since quitting: 35.4   ? Smokeless tobacco: Never   Substance and Sexual Activity   ? Alcohol use: Yes   ? Drug use: Never  Review of Systems   Constitutional: Negative.    HENT: Negative.    Eyes: Negative.    Respiratory: Negative.    Cardiovascular: Negative.    Gastrointestinal: Negative.    Genitourinary: Negative.    Musculoskeletal: Negative.    Skin: Negative.    Neurological: Negative.    Psychiatric/Behavioral: Negative.      Objective:         ? ALPRAZolam (XANAX) 0.5 mg tablet Take one tablet by mouth daily.   ? atorvastatin (LIPITOR) 10 mg tablet TAKE 1 TABLET BY MOUTH ONCE DAILY FOR 90 DAYS   ? celecoxib (CELEBREX) 200 mg capsule      There were no vitals filed for this visit.  There is no height or weight on file to calculate BMI.     Physical Exam  Constitutional:       Appearance: She is well-developed.   HENT:      Head: Normocephalic and atraumatic.   Cardiovascular:      Rate and Rhythm: Normal rate.      Heart sounds: Normal heart sounds.   Pulmonary:      Effort: Pulmonary effort is normal.      Breath sounds: Normal breath sounds.   Abdominal:      Palpations: Abdomen is soft.      Tenderness: There is no abdominal tenderness.   Musculoskeletal:         General: No tenderness.      Cervical back: Neck supple.   Skin:     General: Skin is warm and dry.   Neurological:      Mental Status: She is alert and oriented to person, place, and time.       Lymphatics: Left cervical lymph node measuring 2 cm palpable.  No other lymphadenopathy.  No hepatosplenomegaly.     Assessment and Plan:    1.  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  CLL-IPI Low risk: 91 and 87 percent five- and ten-year OS; median not reached.    Bone marrow biopsy performed at [facility] on 01/26/2020 reveals hypercellular marrow with trilineage hematopoiesis and 60% involvement by CLL.  CLL FISH panel and I GVH sequencing has been ordered.  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  Cytogenetics revealed a normal karyotype.  Fish studies on the bone marrow is negative for deletion of MYB, ATM, TP53, RB1, 13q14.3, and lamp 1, for trisomy 12, IGH-CCND1 (11;14) fusion, and for IGH rearrangement.  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  B2 microglobulin 2.5 on 02/16/2020.     Leukocytosis with absolute lymphocytosis and left cervical lymphadenopathy since May 2019.    WBC 12.3, hemoglobin 14.1 and platelet count 231 on 05/12/2018.  WBC 28.6, hemoglobin 14.4 and platelet count 229 on 01/12/2020.  WBC 27.0, hemoglobin 13.9 and platelet count 237 on 01/26/2020.  WBC 24.6 on 02/16/2020.  Hemoglobin 14.1 and platelet count 228.  CBC on 05/10/2020 reveals hemoglobin 13.6, WBC 27.4 and platelet count of 191.  Absolute lymphocyte count is 19.2.  WBC worse at 34.7 on 11/12/2020.  Hemoglobin normal at 14.2 and platelet count normal at 204.    She had one FNA on a lymph node 01/09/2020 in her neck that per her was inconclusive with evidence of predominantly small lymphocytes raising the possibility of a neoplastic process, so ENT had recommended for an excisional lymph node biopsy.    Discussed in  detail prognosis and treatment options of CLL and I explained in detail and I also discussed the role of chemotherapy in patients with CLL.      CLL-IPI?--?The International CLL-IPI working group has proposed an international prognostic index for patients with CLL, which incorporates data regarding disease stage, biology, and patient-related factors. This staging system may help to assign patients with higher or lower risk CLL to particular interventions in clinical trials in order to get homogeneous study groups.  The scoring system was developed and validated using data from [id number] previously untreated patients enrolled on one of eight phase 3 clinical trials completed between 1997 and 2007 [123]. Five factors were identified as independent predictors of short survival on multivariate analysis and assigned a weighted score:  ?Del(17p) or TP53 mutation - 4 points  ?Serum B2M >3.5 mg/L - 2 points   ?Unmutated IGHV - 2 points  ?Rai stage I to IV (table 1) or Binet stage B or C (table 2) - 1 point  ?Age >65 years - 1 point  Analysis of these five factors resulted in a final prognostic score, which was able to identify four risk groups with significantly different estimated rates of OS in the internal validation dataset:  ?Low risk (0 to 1 points) - 91 and 87 percent five- and ten-year OS; median not reached  ?Intermediate risk (2 to 3 points) - 80 and 40 percent five- and ten-year OS; median 104 months  ?High risk (4 to 6 points) - 53 and 16 percent five- and ten-year OS; median 63 months  ?Very high risk (7 to 10 points) - 19 and 0 percent five- and ten-year OS; median 31 months  The type of first-line treatment was not identified as an independent factor in this analysis; however, the studies did not include treatment with novel oral inhibitors (eg, idelalisib, ibrutinib, venetoclax), which have demonstrated activity in patients with del(17p) or TP53 mutations. The ability of this scoring system to prognosticate OS and time to first treatment has also been validated in independent cohorts of patients with newly diagnosed and previously untreated patients with CLL     I reviewed the CLL-IPI prognostic group and I also discussed the survival data as above.  She falls in the low risk category with excellent prognosis.  I also reviewed the criteria for treatment and there is no indication for treatment at this point.      Labs from 05/10/2020 revealed that the WBC is slightly worse.  Hemoglobin and platelet counts are normal.    WBC worse at 27.9 on 08/09/2020.  Hemoglobin 14.1 and platelet count 195.  Plan for follow-up CBC in 3 months.  WBC worse at 34.7 on 11/12/2020.  Hemoglobin normal at 14.2 and platelet count normal at 204.    She noticed a new lymph node in the right cervical region on 12/27/2020 and hence she came in for an urgent care visit on 12/27/2020.  She reports that it is mildly painful but she denies any sore throat fever or infections.  She thinks she has a bad tooth and have advised her to see her dentist.  I advised her to keep a watch on this and call again if this any significant worsening.  I also gave her the option of another biopsy and she would like to defer it for now.    I discussed with the patient in detail on 02/25/2021.  She mentions that she had a course of antibiotics at home when she took it in  the lymph node shrunk and resolved very quickly.  She subsequently saw her dentist who did not find any abnormalities.  She has not had any recurrence of this right cervical lymph node enlargement.    Labs on 05/09/2021 reveals a WBC of 40.3 which is worse than before.  85% lymphocytes.  Normal hemoglobin at 13.5 and a platelet count 204.   WBC worse at 47.5 on 08/27/2021.  Hemoglobin stable at 12.3 and platelet count 187.  WBC stable at 46.7 on 11/21/2021.  Hemoglobin 13.4 and platelet count 200.  WBC worse at 54.8 on 02/27/2022.  Hemoglobin and platelets continue to stay normal.  WBC worse at 57.9 on 05/28/22. Hb worse at 11.8. Plt 195. SHe has had hemorrhoidal bleeding at times.  No indications to initiate treatment for CLL.    I will check CBC, iron, b12, folate and office visit in 3 months.  She reports bilateral shoulder pains.  She had MRI done at [facility].  She continues to follow-up with PCP for management.  I reassured her that there is not related to the 2 cm lymph node in the left side of her neck and it is also unrelated to underlying CLL.  I have advised her to follow-up with her PCP for management.  She will return for follow-up in 3 months.    2.  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    I received a note from patient's ENT physician Dr. Aniceto Boss who discussed with the pathologist again to make sure that this lymph node in the neck is not something different from her manifestation of CLL.  The pathologist confirmed that it is unlikely to be 2 different pathologies and hence agreed to cancel excisional biopsy of the left neck lymph node and proceed with bone marrow biopsy 01/26/20.    3.  s/p Shingrix vaccine, she has second dose pending for which she will follow-up with her primary care physician.    CBC w diff  CBC with Diff Latest Ref Rng & Units 05/28/2022 02/27/2022 11/21/2021 08/27/2021 05/09/2021   WBC 4.5 - 11.0 K/UL 57.9(HH) 54.8(HH) 46.7(H) 47.5(H) 40.3(H)   RBC 4.0 - 5.0 M/UL 3.65(L) 4.17 4.24 3.96(L) 4.32   HGB 12.0 - 15.0 GM/DL 11.8(L) 13.3 13.4 12.3 13.5   HCT 36 - 45 % 34.9(L) 40.1 40.9 37.9 41.6   MCV 80 - 100 FL 95.5 96.3 96.6 95.8 96.3   MCH 26 - 34 PG 32.4 31.9 31.5 31.2 31.2   MCHC 32.0 - 36.0 G/DL 09.8 11.9 14.7 82.9 56.2   RDW 11 - 15 % 13.4 13.9 13.9 13.9 13.7   PLT 150 - 400 K/UL 195 204 200 187 204   MPV 7 - 11 FL 8.5 9.4 9.2 8.7 9.0   NEUT 41 - 77 % 6(L) 9(L) 10(L) 12(L) 12(L)   ANC 1.8 - 7.0 K/UL 3.40 4.75 4.51 5.87 4.91   LYMA 24 - 44 % 92(H) 90(H) 87(H) 86(H) 85(H)   ALYM 1.0 - 4.8 K/UL 52.97(H) 49.21(H) 40.61(H) 40.67(H) 34.27(H)   MONA 4 - 12 % 2(L) 1(L) 3(L) 2(L) 2(L)   AMONO 0 - 0.80 K/UL 1.32(H) 0.56 1.33(H) 0.75 0.82(H)   EOSA 0 - 5 % 0 0 0 0 1   AEOS 0 - 0.45 K/UL 0.12 0.13 0.12 0.18 0.25   BASA 0 - 2 % 0 0 0 0 0   ABAS 0 - 0.20 K/UL 0.14 0.13 0.13 0.04 0.04  25 min

## 2022-06-12 ENCOUNTER — Encounter: Admit: 2022-06-12 | Discharge: 2022-06-12 | Payer: 59

## 2022-06-12 DIAGNOSIS — C9110 Chronic lymphocytic leukemia of B-cell type not having achieved remission: Secondary | ICD-10-CM

## 2022-09-05 ENCOUNTER — Encounter: Admit: 2022-09-05 | Discharge: 2022-09-05 | Payer: 59

## 2022-09-08 ENCOUNTER — Encounter: Admit: 2022-09-08 | Discharge: 2022-09-08 | Payer: MEDICARE

## 2022-09-08 DIAGNOSIS — D72829 Elevated white blood cell count, unspecified: Secondary | ICD-10-CM

## 2022-09-08 DIAGNOSIS — C9110 Chronic lymphocytic leukemia of B-cell type not having achieved remission: Secondary | ICD-10-CM

## 2022-09-08 LAB — IRON + BINDING CAPACITY + %SAT+ FERRITIN
% SATURATION: 32 % (ref 28–42)
FERRITIN: 76 ng/mL (ref 10–200)
IRON BINDING: 335 ug/dL (ref 270–380)
IRON: 107 ug/dL (ref 50–160)

## 2022-09-08 LAB — CBC AND DIFF
ABSOLUTE BASO COUNT: 0.1 K/UL (ref 0–0.20)
ABSOLUTE EOS COUNT: 0.1 K/UL (ref 0–0.45)
ABSOLUTE LYMPH COUNT: 57 K/UL — ABNORMAL HIGH (ref 1.0–4.8)
ABSOLUTE MONO COUNT: 1.1 K/UL — ABNORMAL HIGH (ref 0–0.80)
ABSOLUTE NEUTROPHIL: 4.6 K/UL (ref 1.8–7.0)
BASOPHILS %: 0 % (ref 0–2)
EOSINOPHILS %: 0 % (ref 0–5)
HEMATOCRIT: 33 % — ABNORMAL LOW (ref 36–45)
HEMOGLOBIN: 11 g/dL — ABNORMAL LOW (ref 12.0–15.0)
LYMPHOCYTES %: 91 % — ABNORMAL HIGH (ref 24–44)
MCH: 32 pg (ref 26–34)
MCHC: 33 g/dL (ref 32.0–36.0)
MCV: 95 FL (ref 80–100)
MONOCYTES %: 2 % — ABNORMAL LOW (ref 4–12)
MPV: 8.6 FL (ref 7–11)
NEUTROPHILS %: 7 % — ABNORMAL LOW (ref 41–77)
PLATELET COUNT: 210 K/UL (ref 150–400)
RBC COUNT: 3.5 M/UL — ABNORMAL LOW (ref 4.0–5.0)
RDW: 13 % (ref 11–15)
WBC COUNT: 63 K/UL — ABNORMAL HIGH (ref 4.5–11.0)

## 2022-09-08 LAB — VITAMIN B12: VITAMIN B12: 153 pg/mL — ABNORMAL LOW (ref 180–914)

## 2022-09-08 LAB — FOLATE, SERUM: SERUM FOLATE: 9.4 ng/mL (ref >3.9)

## 2022-09-09 ENCOUNTER — Encounter: Admit: 2022-09-09 | Discharge: 2022-09-09 | Payer: MEDICARE

## 2022-09-09 NOTE — Telephone Encounter
-----   Message from Ander Purpura, MD sent at 09/09/2022 11:03 AM CDT -----  Notify pt of results.  B12 is very low.  Start vitamin B12 1000 mcg p.o. daily.  We will address at the next appointment regarding oral versus B12 injections.

## 2022-09-09 NOTE — Telephone Encounter
LVM for patient advising her to start Vit B12 1066mg daily as her B12 is low. Advised it may be purchased OTC. Requested a call back if any questions or concerns.

## 2022-09-15 ENCOUNTER — Encounter: Admit: 2022-09-15 | Discharge: 2022-09-15 | Payer: MEDICARE

## 2022-09-15 DIAGNOSIS — M1990 Unspecified osteoarthritis, unspecified site: Secondary | ICD-10-CM

## 2022-09-15 DIAGNOSIS — E538 Deficiency of other specified B group vitamins: Secondary | ICD-10-CM

## 2022-09-15 DIAGNOSIS — C9110 Chronic lymphocytic leukemia of B-cell type not having achieved remission: Secondary | ICD-10-CM

## 2022-09-15 NOTE — Progress Notes
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    History of Present Illness  Lindsey Brown is a 66 y.o. female.    Lindsey Brown is a 66 year old female presenting with leukocytosis and lymphadenopathy in her neck since 2019. She had one FNA on a lymph node 01/09/2020 in her neck that per her was inconclusive with evidence of predominantly small lymphocytes raising the possibility of a neoplastic process, so ENT had recommended for an excisional lymph node biopsy at [facility]. She was also treated with a two week course of doxycycline which did not bring down the size of her lymph nodes. She requested a second opinion on if having an excisional lymph node biopsy is the best next step and would like hem/onc opinion before moving forward. Labs are attached to the navigation e-mail. WBC in Jan 2021: 28.6, ALC: 16109.  WBC 28.6, hemoglobin 14.4, platelet count 229 and absolute lymphocyte count 22.3. Peripheral smear revealed leukocytosis, lymphocytosis and atypical lymphocytes.    CBC on 01/02/2020 revealed a WBC of 25.9, hemoglobin 14.3 and a platelet count of 266.    No history of fevers chills or night sweats.  No loss of weight or loss of appetite.  No nosebleeds or gum bleeding.  No hematemesis melena or urticaria.  No hemoptysis or hematuria.    Review of old records indicates that she had leukocytosis on 05/12/2018 with a WBC of 12.3, hemoglobin 14.1 and a platelet count of 231.  Absolute lymphocyte count was 6027.    Bone marrow biopsy performed at [facility] on 01/26/2020 reveals hypercellular marrow with trilineage hematopoiesis and 60% involvement by CLL.  CLL FISH panel unremarkable, and I GVH sequencing has been ordered.  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  Cytogenetics revealed a normal karyotype.    She noticed a new lymph node in the right cervical region on 12/27/2020 and hence she came in for an urgent care visit on 12/27/2020.  She reports that it is mildly painful but she denies any sore throat fever or infections.  She thinks she has a bad tooth and have advised her to see her dentist.    She established with me on 01/23/2020 at [facility] heme-onc clinic.    Interval History: No fever chills or night sweats.  No chest pain or shortness of breath.  No nausea vomiting.    Family history: Sister has cutaneous T-cell lymphoma.    Medical History:   Diagnosis Date   ? Arthritis      Surgical History:   Procedure Laterality Date   ? COLONOSCOPY       Family History   Problem Relation Age of Onset   ? Arthritis-rheumatoid Mother    ? Cancer Sister    ? Heart Disease Sister    ? Thyroid Disease Sister    ? Asthma Son    ? Diabetes Son      Social History     Socioeconomic History   ? Marital status: Married   Tobacco Use   ? Smoking status: Former     Types: Cigarettes     Quit date: 1988     Years since quitting: 35.7   ? Smokeless tobacco: Never   Substance and Sexual Activity   ? Alcohol use: Yes   ? Drug use: Never  Review of Systems   Constitutional: Negative.    HENT: Negative.    Eyes: Negative.    Respiratory: Negative.    Cardiovascular: Negative.    Gastrointestinal: Negative.    Genitourinary: Negative.    Musculoskeletal: Negative.    Skin: Negative.    Neurological: Negative.    Psychiatric/Behavioral: Negative.      Objective:         ? ALPRAZolam (XANAX) 0.5 mg tablet Take one tablet by mouth daily.   ? atorvastatin (LIPITOR) 10 mg tablet TAKE 1 TABLET BY MOUTH ONCE DAILY FOR 90 DAYS   ? celecoxib (CELEBREX) 200 mg capsule    ? lisinopril (ZESTRIL) 5 mg tablet Take one tablet by mouth daily.     Vitals:    09/15/22 1300   BP: (!) 158/91   BP Source: Arm, Left Upper   Pulse: 90   Temp: 36.2 ?C (97.2 ?F)   Resp: 18   SpO2: 99%   TempSrc: Temporal   PainSc: Zero   Weight: 82.6 kg (182 lb 3.2 oz)   Height: 171.6 cm (5' 7.56)     Body mass index is 28.07 kg/m?Marland Kitchen     Physical Exam  Constitutional:       Appearance: She is well-developed.   HENT:      Head: Normocephalic and atraumatic.   Cardiovascular:      Rate and Rhythm: Normal rate.      Heart sounds: Normal heart sounds.   Pulmonary:      Effort: Pulmonary effort is normal.      Breath sounds: Normal breath sounds.   Abdominal:      Palpations: Abdomen is soft.      Tenderness: There is no abdominal tenderness.   Musculoskeletal:         General: No tenderness.      Cervical back: Neck supple.   Skin:     General: Skin is warm and dry.   Neurological:      Mental Status: She is alert and oriented to person, place, and time.       Lymphatics: Left cervical lymph node measuring 2 cm palpable.  No other lymphadenopathy.  No hepatosplenomegaly.     Assessment and Plan:    1.  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  CLL-IPI Low risk: 91 and 87 percent five- and ten-year OS; median not reached.    Bone marrow biopsy performed at [facility] on 01/26/2020 reveals hypercellular marrow with trilineage hematopoiesis and 60% involvement by CLL.  CLL FISH panel and I GVH sequencing has been ordered.  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  Cytogenetics revealed a normal karyotype.  Fish studies on the bone marrow is negative for deletion of MYB, ATM, TP53, RB1, 13q14.3, and lamp 1, for trisomy 12, IGH-CCND1 (11;14) fusion, and for IGH rearrangement.  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  B2 microglobulin 2.5 on 02/16/2020.     Leukocytosis with absolute lymphocytosis and left cervical lymphadenopathy since May 2019.    WBC 12.3, hemoglobin 14.1 and platelet count 231 on 05/12/2018.  WBC 28.6, hemoglobin 14.4 and platelet count 229 on 01/12/2020.  WBC 27.0, hemoglobin 13.9 and platelet count 237 on 01/26/2020.  WBC 24.6 on 02/16/2020.  Hemoglobin 14.1 and platelet count 228.  CBC on 05/10/2020 reveals hemoglobin 13.6, WBC 27.4 and platelet count of 191.  Absolute lymphocyte count is 19.2.  WBC worse at 34.7 on 11/12/2020.  Hemoglobin normal at 14.2 and platelet count normal at 204.    She had one FNA on a lymph node 01/09/2020 in her neck that per her was inconclusive with evidence of predominantly small lymphocytes raising the possibility of a neoplastic process, so ENT had recommended for an excisional lymph node biopsy.    Discussed in detail prognosis and treatment options of CLL and I explained in detail and I also discussed the role of chemotherapy in patients with CLL.      CLL-IPI?--?The International CLL-IPI working group has proposed an international prognostic index for patients with CLL, which incorporates data regarding disease stage, biology, and patient-related factors. This staging system may help to assign patients with higher or lower risk CLL to particular interventions in clinical trials in order to get homogeneous study groups.  The scoring system was developed and validated using data from [id number] previously untreated patients enrolled on one of eight phase 3 clinical trials completed between 1997 and 2007 [123]. Five factors were identified as independent predictors of short survival on multivariate analysis and assigned a weighted score:  ?Del(17p) or TP53 mutation - 4 points  ?Serum B2M >3.5 mg/L - 2 points   ?Unmutated IGHV - 2 points  ?Rai stage I to IV (table 1) or Binet stage B or C (table 2) - 1 point  ?Age >65 years - 1 point  Analysis of these five factors resulted in a final prognostic score, which was able to identify four risk groups with significantly different estimated rates of OS in the internal validation dataset:  ?Low risk (0 to 1 points) - 91 and 87 percent five- and ten-year OS; median not reached  ?Intermediate risk (2 to 3 points) - 80 and 40 percent five- and ten-year OS; median 104 months  ?High risk (4 to 6 points) - 53 and 16 percent five- and ten-year OS; median 63 months  ?Very high risk (7 to 10 points) - 19 and 0 percent five- and ten-year OS; median 31 months  The type of first-line treatment was not identified as an independent factor in this analysis; however, the studies did not include treatment with novel oral inhibitors (eg, idelalisib, ibrutinib, venetoclax), which have demonstrated activity in patients with del(17p) or TP53 mutations. The ability of this scoring system to prognosticate OS and time to first treatment has also been validated in independent cohorts of patients with newly diagnosed and previously untreated patients with CLL     I reviewed the CLL-IPI prognostic group and I also discussed the survival data as above.  She falls in the low risk category with excellent prognosis.  I also reviewed the criteria for treatment and there is no indication for treatment at this point.      Labs from 05/10/2020 revealed that the WBC is slightly worse.  Hemoglobin and platelet counts are normal.    WBC worse at 27.9 on 08/09/2020.  Hemoglobin 14.1 and platelet count 195.  Plan for follow-up CBC in 3 months.  WBC worse at 34.7 on 11/12/2020.  Hemoglobin normal at 14.2 and platelet count normal at 204.    She noticed a new lymph node in the right cervical region on 12/27/2020 and hence she came in for an urgent care visit on 12/27/2020.  She reports that it is mildly painful but she denies any sore throat fever or infections.  She thinks she has a bad tooth and have advised her to see her dentist.  I advised her to keep a watch on this and call again if this any significant worsening.  I also gave her the option of another biopsy and she would like to defer it for now.    I discussed with the patient in detail on 02/25/2021.  She mentions that she had a course of antibiotics at home when she took it in the lymph node shrunk and resolved very quickly.  She subsequently saw her dentist who did not find any abnormalities.  She has not had any recurrence of this right cervical lymph node enlargement.    Labs on 05/09/2021 reveals a WBC of 40.3 which is worse than before.  85% lymphocytes.  Normal hemoglobin at 13.5 and a platelet count 204.   WBC worse at 47.5 on 08/27/2021.  Hemoglobin stable at 12.3 and platelet count 187.  WBC stable at 46.7 on 11/21/2021.  Hemoglobin 13.4 and platelet count 200.  WBC worse at 54.8 on 02/27/2022.  Hemoglobin and platelets continue to stay normal.  WBC worse at 57.9 on 05/28/22. Hb worse at 11.8. Plt 195. SHe has had hemorrhoidal bleeding at times.  WBC worse at 63.7 on 09/08/2022.  Hemoglobin worse at 11.4 and platelet count stable at 210.  No indications to initiate treatment for CLL.    I will check CBC, iron, b12, and office visit in 3 months.  She reports bilateral shoulder pains.  She had MRI done at [facility].  She continues to follow-up with PCP for management.  I reassured her that there is not related to the 2 cm lymph node in the left side of her neck and it is also unrelated to underlying CLL.  I have advised her to follow-up with her PCP for management.    She will return for follow-up in 3 months.    2.  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    I received a note from patient's ENT physician Dr. Aniceto Boss who discussed with the pathologist again to make sure that this lymph node in the neck is not something different from her manifestation of CLL.  The pathologist confirmed that it is unlikely to be 2 different pathologies and hence agreed to cancel excisional biopsy of the left neck lymph node and proceed with bone marrow biopsy 01/26/20.    3.  s/p Shingrix vaccine, she has second dose pending for which she will follow-up with her primary care physician.    4.  B12 deficiency new.  B12 153 on 09/08/2022.  Started vitamin B12 1000 mcg p.o. daily.  I discussed with her the option of continuing B12 pills versus the option of B12 injections.  She prefers the pills.  I will check for response in 3 months.  If she does not respond then I will change her to the B12 pills.    CBC w diff      Latest Ref Rng & Units 09/08/2022     1:03 PM 05/28/2022     1:30 PM 02/27/2022    12:53 PM 11/21/2021     2:14 PM 08/27/2021     2:06 PM   CBC with Diff   White Blood Cells 4.5 - 11.0 K/UL 63.7  57.9  54.8  46.7  47.5    RBC 4.0 - 5.0 M/UL 3.54  3.65  4.17  4.24  3.96    Hemoglobin 12.0 - 15.0 GM/DL 29.5  62.1  30.8  65.7  12.3    Hematocrit 36 - 45 %  33.9  34.9  40.1  40.9  37.9    MCV 80 - 100 FL 95.9  95.5  96.3  96.6  95.8    MCH 26 - 34 PG 32.1  32.4  31.9  31.5  31.2    MCHC 32.0 - 36.0 G/DL 16.1  09.6  04.5  40.9  32.6    RDW 11 - 15 % 13.7  13.4  13.9  13.9  13.9    Platelet Count 150 - 400 K/UL 210  195  204  200  187    MPV 7 - 11 FL 8.6  8.5  9.4  9.2  8.7    Neutrophils 41 - 77 % 7  6  9  10  12     Absolute Neutrophil Count 1.8 - 7.0 K/UL 4.60  3.40  4.75  4.51  5.87    Lymphocytes 24 - 44 % 91  92  90  87  86    Absolute Lymph Count 1.0 - 4.8 K/UL 57.66  52.97  49.21  40.61  40.67    Monocytes 4 - 12 % 2  2  1  3  2     Absolute Monocyte Count 0 - 0.80 K/UL 1.10  1.32  0.56  1.33  0.75    Eosinophils 0 - 5 % 0  0  0  0  0    Absolute Eosinophil Count 0 - 0.45 K/UL 0.18  0.12  0.13  0.12  0.18    Basophils 0 - 2 % 0  0  0  0  0    Absolute Basophil Count 0 - 0.20 K/UL 0.17  0.14  0.13  0.13  0.04

## 2022-12-04 ENCOUNTER — Encounter: Admit: 2022-12-04 | Discharge: 2022-12-04 | Payer: MEDICARE

## 2022-12-24 ENCOUNTER — Encounter: Admit: 2022-12-24 | Discharge: 2022-12-24 | Payer: MEDICARE

## 2022-12-25 ENCOUNTER — Encounter: Admit: 2022-12-25 | Discharge: 2022-12-25 | Payer: MEDICARE

## 2022-12-25 DIAGNOSIS — E538 Deficiency of other specified B group vitamins: Secondary | ICD-10-CM

## 2022-12-25 DIAGNOSIS — C9110 Chronic lymphocytic leukemia of B-cell type not having achieved remission: Secondary | ICD-10-CM

## 2022-12-25 LAB — CBC AND DIFF
ABSOLUTE BASO COUNT: 0.2 K/UL — ABNORMAL HIGH (ref 0–0.20)
ABSOLUTE EOS COUNT: 0.2 K/UL (ref 0–0.45)
ABSOLUTE LYMPH COUNT: 67 K/UL — ABNORMAL HIGH (ref 1.0–4.8)
ABSOLUTE MONO COUNT: 1.6 K/UL — ABNORMAL HIGH (ref 0–0.80)
ABSOLUTE NEUTROPHIL: 5.4 K/UL (ref 1.8–7.0)
BASOPHILS %: 0 % (ref 0–2)
EOSINOPHILS %: 0 % (ref 0–5)
HEMATOCRIT: 33 % — ABNORMAL LOW (ref 36–45)
HEMOGLOBIN: 10 g/dL — ABNORMAL LOW (ref 12.0–15.0)
LYMPHOCYTES %: 91 % — ABNORMAL HIGH (ref 24–44)
MCH: 30 pg (ref 26–34)
MCHC: 31 g/dL — ABNORMAL LOW (ref 32.0–36.0)
MCV: 96 FL (ref 80–100)
MONOCYTES %: 2 % — ABNORMAL LOW (ref 4–12)
MPV: 8.2 FL (ref 7–11)
NEUTROPHILS %: 7 % — ABNORMAL LOW (ref 41–77)
PLATELET COUNT: 210 K/UL (ref 150–400)
RBC COUNT: 3.4 M/UL — ABNORMAL LOW (ref 4.0–5.0)
RDW: 13 % (ref 11–15)
WBC COUNT: 74 K/UL — ABNORMAL HIGH (ref 4.5–11.0)

## 2022-12-25 LAB — IRON + BINDING CAPACITY + %SAT+ FERRITIN
FERRITIN: 104 ng/mL (ref 10–200)
IRON BINDING: 344 ug/dL (ref 270–380)
IRON: 73 ug/dL (ref 50–160)

## 2022-12-25 LAB — FOLATE, SERUM: SERUM FOLATE: 9.7 ng/mL — ABNORMAL LOW (ref >3.9)

## 2022-12-25 LAB — VITAMIN B12: VITAMIN B12: 556 pg/mL (ref 180–914)

## 2022-12-29 ENCOUNTER — Encounter: Admit: 2022-12-29 | Discharge: 2022-12-29 | Payer: MEDICARE

## 2022-12-29 DIAGNOSIS — C9110 Chronic lymphocytic leukemia of B-cell type not having achieved remission: Secondary | ICD-10-CM

## 2022-12-29 DIAGNOSIS — E538 Deficiency of other specified B group vitamins: Secondary | ICD-10-CM

## 2022-12-29 NOTE — Progress Notes
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    History of Present Illness  Lindsey Brown is a 67 y.o. female.    Lindsey Brown is a 67 year old female presenting with leukocytosis and lymphadenopathy in her neck since 2019. She had one FNA on a lymph node 01/09/2020 in her neck that per her was inconclusive with evidence of predominantly small lymphocytes raising the possibility of a neoplastic process, so ENT had recommended for an excisional lymph node biopsy at [facility]. She was also treated with a two week course of doxycycline which did not bring down the size of her lymph nodes. She requested a second opinion on if having an excisional lymph node biopsy is the best next step and would like hem/onc opinion before moving forward. Labs are attached to the navigation e-mail. WBC in Jan 2021: 28.6, ALC: 16109.  WBC 28.6, hemoglobin 14.4, platelet count 229 and absolute lymphocyte count 22.3. Peripheral smear revealed leukocytosis, lymphocytosis and atypical lymphocytes.    CBC on 01/02/2020 revealed a WBC of 25.9, hemoglobin 14.3 and a platelet count of 266.    No history of fevers chills or night sweats.  No loss of weight or loss of appetite.  No nosebleeds or gum bleeding.  No hematemesis melena or urticaria.  No hemoptysis or hematuria.    Review of old records indicates that she had leukocytosis on 05/12/2018 with a WBC of 12.3, hemoglobin 14.1 and a platelet count of 231.  Absolute lymphocyte count was 6027.    Bone marrow biopsy performed at [facility] on 01/26/2020 reveals hypercellular marrow with trilineage hematopoiesis and 60% involvement by CLL.  CLL FISH panel unremarkable, and I GVH sequencing has been ordered.  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  Cytogenetics revealed a normal karyotype.    She noticed a new lymph node in the right cervical region on 12/27/2020 and hence she came in for an urgent care visit on 12/27/2020.  She reports that it is mildly painful but she denies any sore throat fever or infections.  She thinks she has a bad tooth and have advised her to see her dentist.    She established with me on 01/23/2020 at [facility] heme-onc clinic.    Interval History: No fever chills or night sweats.  No chest pain or shortness of breath.  No nausea vomiting.    Family history: Sister has cutaneous T-cell lymphoma.    Medical History:   Diagnosis Date    Arthritis      Surgical History:   Procedure Laterality Date    COLONOSCOPY       Family History   Problem Relation Age of Onset    Arthritis-rheumatoid Mother     Cancer Sister     Heart Disease Sister     Thyroid Disease Sister     Asthma Son     Diabetes Son      Social History     Socioeconomic History    Marital status: Married   Tobacco Use    Smoking status: Former     Types: Cigarettes     Quit date: 1988     Years since quitting: 36.0    Smokeless tobacco: Never   Substance and Sexual Activity    Alcohol use: Yes    Drug use: Never  Review of Systems   Constitutional: Negative.    HENT: Negative.     Eyes: Negative.    Respiratory: Negative.     Cardiovascular: Negative.    Gastrointestinal: Negative.    Genitourinary: Negative.    Musculoskeletal: Negative.    Skin: Negative.    Neurological: Negative.    Psychiatric/Behavioral: Negative.       Objective:          ALPRAZolam (XANAX) 0.5 mg tablet Take one tablet by mouth daily.    atorvastatin (LIPITOR) 10 mg tablet TAKE 1 TABLET BY MOUTH ONCE DAILY FOR 90 DAYS    lisinopril (ZESTRIL) 5 mg tablet Take one tablet by mouth daily.     There were no vitals filed for this visit.    There is no height or weight on file to calculate BMI.     Physical Exam  Constitutional:       Appearance: She is well-developed.   HENT:      Head: Normocephalic and atraumatic.   Cardiovascular:      Rate and Rhythm: Normal rate.      Heart sounds: Normal heart sounds.   Pulmonary:      Effort: Pulmonary effort is normal.      Breath sounds: Normal breath sounds.   Abdominal:      Palpations: Abdomen is soft.      Tenderness: There is no abdominal tenderness.   Musculoskeletal:         General: No tenderness.      Cervical back: Neck supple.   Skin:     General: Skin is warm and dry.   Neurological:      Mental Status: She is alert and oriented to person, place, and time.       Lymphatics: Left cervical lymph node measuring 2 cm palpable.  No other lymphadenopathy.  No hepatosplenomegaly.     Assessment and Plan:    1.  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  CLL-IPI Low risk: 91 and 87 percent five- and ten-year OS; median not reached.    Bone marrow biopsy performed at [facility] on 01/26/2020 reveals hypercellular marrow with trilineage hematopoiesis and 60% involvement by CLL.  CLL FISH panel and I GVH sequencing has been ordered.  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  Cytogenetics revealed a normal karyotype.  Fish studies on the bone marrow is negative for deletion of MYB, ATM, TP53, RB1, 13q14.3, and lamp 1, for trisomy 12, IGH-CCND1 (11;14) fusion, and for IGH rearrangement.  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  B2 microglobulin 2.5 on 02/16/2020.     Leukocytosis with absolute lymphocytosis and left cervical lymphadenopathy since May 2019.    WBC 12.3, hemoglobin 14.1 and platelet count 231 on 05/12/2018.  WBC 28.6, hemoglobin 14.4 and platelet count 229 on 01/12/2020.  WBC 27.0, hemoglobin 13.9 and platelet count 237 on 01/26/2020.  WBC 24.6 on 02/16/2020.  Hemoglobin 14.1 and platelet count 228.  CBC on 05/10/2020 reveals hemoglobin 13.6, WBC 27.4 and platelet count of 191.  Absolute lymphocyte count is 19.2.  WBC worse at 34.7 on 11/12/2020.  Hemoglobin normal at 14.2 and platelet count normal at 204.    She had one FNA on a lymph node 01/09/2020 in her neck that per her was inconclusive with evidence of predominantly small lymphocytes raising the possibility of a neoplastic process, so ENT had recommended for  an excisional lymph node biopsy.    Discussed in detail prognosis and treatment options of CLL and I explained in detail and I also discussed the role of chemotherapy in patients with CLL.      CLL-IPI -- The International CLL-IPI working group has proposed an international prognostic index for patients with CLL, which incorporates data regarding disease stage, biology, and patient-related factors. This staging system may help to assign patients with higher or lower risk CLL to particular interventions in clinical trials in order to get homogeneous study groups.  The scoring system was developed and validated using data from [id number] previously untreated patients enrolled on one of eight phase 3 clinical trials completed between 1997 and 2007 [123]. Five factors were identified as independent predictors of short survival on multivariate analysis and assigned a weighted score:  ?Del(17p) or TP53 mutation - 4 points  ?Serum B2M >3.5 mg/L - 2 points   ?Unmutated IGHV - 2 points  ?Rai stage I to IV (table 1) or Binet stage B or C (table 2) - 1 point  ?Age >65 years - 1 point  Analysis of these five factors resulted in a final prognostic score, which was able to identify four risk groups with significantly different estimated rates of OS in the internal validation dataset:  ?Low risk (0 to 1 points) - 91 and 87 percent five- and ten-year OS; median not reached  ?Intermediate risk (2 to 3 points) - 80 and 40 percent five- and ten-year OS; median 104 months  ?High risk (4 to 6 points) - 53 and 16 percent five- and ten-year OS; median 63 months  ?Very high risk (7 to 10 points) - 19 and 0 percent five- and ten-year OS; median 31 months  The type of first-line treatment was not identified as an independent factor in this analysis; however, the studies did not include treatment with novel oral inhibitors (eg, idelalisib, ibrutinib, venetoclax), which have demonstrated activity in patients with del(17p) or TP53 mutations. The ability of this scoring system to prognosticate OS and time to first treatment has also been validated in independent cohorts of patients with newly diagnosed and previously untreated patients with CLL     I reviewed the CLL-IPI prognostic group and I also discussed the survival data as above.  She falls in the low risk category with excellent prognosis.  I also reviewed the criteria for treatment and there is no indication for treatment at this point.      Labs from 05/10/2020 revealed that the WBC is slightly worse.  Hemoglobin and platelet counts are normal.    WBC worse at 27.9 on 08/09/2020.  Hemoglobin 14.1 and platelet count 195.  Plan for follow-up CBC in 3 months.  WBC worse at 34.7 on 11/12/2020.  Hemoglobin normal at 14.2 and platelet count normal at 204.    She noticed a new lymph node in the right cervical region on 12/27/2020 and hence she came in for an urgent care visit on 12/27/2020.  She reports that it is mildly painful but she denies any sore throat fever or infections.  She thinks she has a bad tooth and have advised her to see her dentist.  I advised her to keep a watch on this and call again if this any significant worsening.  I also gave her the option of another biopsy and she would like to defer it for now.    I discussed with the patient in detail on 02/25/2021.  She mentions that she  had a course of antibiotics at home when she took it in the lymph node shrunk and resolved very quickly.  She subsequently saw her dentist who did not find any abnormalities.  She has not had any recurrence of this right cervical lymph node enlargement.    Labs on 05/09/2021 reveals a WBC of 40.3 which is worse than before.  85% lymphocytes.  Normal hemoglobin at 13.5 and a platelet count 204.   WBC worse at 47.5 on 08/27/2021.  Hemoglobin stable at 12.3 and platelet count 187.  WBC stable at 46.7 on 11/21/2021.  Hemoglobin 13.4 and platelet count 200.  WBC worse at 54.8 on 02/27/2022.  Hemoglobin and platelets continue to stay normal.  WBC worse at 57.9 on 05/28/22. Hb worse at 11.8. Plt 195. SHe has had hemorrhoidal bleeding at times.  WBC worse at 63.7 on 09/08/2022.  Hemoglobin worse at 11.4 and platelet count stable at 210.  No indications to initiate treatment for CLL.    I will check CBC, iron, b12, and office visit in 3 months.  She reports bilateral shoulder pains.  She had MRI done at [facility].  She continues to follow-up with PCP for management.  I reassured her that there is not related to the 2 cm lymph node in the left side of her neck and it is also unrelated to underlying CLL.  I have advised her to follow-up with her PCP for management.    She will return for follow-up in 3 months.    2.  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    I received a note from patient's ENT physician Dr. Aniceto Boss who discussed with the pathologist again to make sure that this lymph node in the neck is not something different from her manifestation of CLL.  The pathologist confirmed that it is unlikely to be 2 different pathologies and hence agreed to cancel excisional biopsy of the left neck lymph node and proceed with bone marrow biopsy 01/26/20.    3.  s/p Shingrix vaccine, she has second dose pending for which she will follow-up with her primary care physician.    4.  B12 deficiency new.  B12 153 on 09/08/2022.  Started vitamin B12 1000 mcg p.o. daily.  I discussed with her the option of continuing B12 pills versus the option of B12 injections.  She prefers the pills.  I will check for response in 3 months.  If she does not respond then I will change her to the B12 pills.  B12 improved 556 on 12/25/2022, continue B12 supplementation.  I will recheck in 3 months.    5. Low iron saturation - 21% on 12/25/22 -she reports worsening fatigue and hence I asked her to try over-the-counter iron supplementation.    CBC w diff      Latest Ref Rng & Units 12/25/2022    12:45 PM 09/08/2022     1:03 PM 05/28/2022     1:30 PM 02/27/2022    12:53 PM 11/21/2021     2:14 PM   CBC with Diff   White Blood Cells 4.5 - 11.0 K/UL 74.6  63.7  57.9  54.8  46.7    RBC 4.0 - 5.0 M/UL 3.48  3.54  3.65  4.17  4.24    Hemoglobin 12.0 - 15.0 GM/DL 13.0  86.5  78.4  69.6  13.4    Hematocrit 36 - 45 % 33.4  33.9  34.9  40.1  40.9    MCV  80 - 100 FL 96.1  95.9  95.5  96.3  96.6    MCH 26 - 34 PG 30.7  32.1  32.4  31.9  31.5    MCHC 32.0 - 36.0 G/DL 47.8  29.5  62.1  30.8  32.6    RDW 11 - 15 % 13.5  13.7  13.4  13.9  13.9    Platelet Count 150 - 400 K/UL 210  210  195  204  200    MPV 7 - 11 FL 8.2  8.6  8.5  9.4  9.2    Neutrophils 41 - 77 % 7  7  6  9  10     Absolute Neutrophil Count 1.8 - 7.0 K/UL 5.46  4.60  3.40  4.75  4.51    Lymphocytes 24 - 44 % 91  91  92  90  87    Absolute Lymph Count 1.0 - 4.8 K/UL 67.00  57.66  52.97  49.21  40.61    Monocytes 4 - 12 % 2  2  2  1  3     Absolute Monocyte Count 0 - 0.80 K/UL 1.68  1.10  1.32  0.56  1.33    Eosinophils 0 - 5 % 0  0  0  0  0    Absolute Eosinophil Count 0 - 0.45 K/UL 0.23  0.18  0.12  0.13  0.12    Basophils 0 - 2 % 0  0  0  0  0    Absolute Basophil Count 0 - 0.20 K/UL 0.23  0.17  0.14  0.13  0.13                             Total Time Today was 30 minutes in the following activities: Preparing to see the patient, Obtaining and/or reviewing separately obtained history, Performing a medically appropriate examination and/or evaluation, Counseling and educating the patient/family/caregiver, Ordering medications, tests, or procedures, Referring and communication with other health care professionals (when not separately reported), Documenting clinical information in the electronic or other health record, and Care coordination (not separately reported)

## 2023-01-12 ENCOUNTER — Encounter: Admit: 2023-01-12 | Discharge: 2023-01-12 | Payer: MEDICARE

## 2023-01-19 ENCOUNTER — Encounter: Admit: 2023-01-19 | Discharge: 2023-01-19 | Payer: MEDICARE

## 2023-01-19 DIAGNOSIS — C9110 Chronic lymphocytic leukemia of B-cell type not having achieved remission: Secondary | ICD-10-CM

## 2023-01-26 ENCOUNTER — Encounter: Admit: 2023-01-26 | Discharge: 2023-01-26 | Payer: MEDICARE

## 2023-01-26 DIAGNOSIS — C9110 Chronic lymphocytic leukemia of B-cell type not having achieved remission: Secondary | ICD-10-CM

## 2023-01-26 DIAGNOSIS — M1990 Unspecified osteoarthritis, unspecified site: Secondary | ICD-10-CM

## 2023-01-26 NOTE — Progress Notes
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    History of Present Illness  Lindsey Brown is a 67 y.o. female.    Lindsey Brown is a 67 year old female presenting with leukocytosis and lymphadenopathy in her neck since 2019. She had one FNA on a lymph node 01/09/2020 in her neck that per her was inconclusive with evidence of predominantly small lymphocytes raising the possibility of a neoplastic process, so ENT had recommended for an excisional lymph node biopsy at [facility]. She was also treated with a two week course of doxycycline which did not bring down the size of her lymph nodes. She requested a second opinion on if having an excisional lymph node biopsy is the best next step and would like hem/onc opinion before moving forward. Labs are attached to the navigation e-mail. WBC in Jan 2021: 28.6, ALC: 16109.  WBC 28.6, hemoglobin 14.4, platelet count 229 and absolute lymphocyte count 22.3. Peripheral smear revealed leukocytosis, lymphocytosis and atypical lymphocytes.    CBC on 01/02/2020 revealed a WBC of 25.9, hemoglobin 14.3 and a platelet count of 266.    No history of fevers chills or night sweats.  No loss of weight or loss of appetite.  No nosebleeds or gum bleeding.  No hematemesis melena or urticaria.  No hemoptysis or hematuria.    Review of old records indicates that she had leukocytosis on 05/12/2018 with a WBC of 12.3, hemoglobin 14.1 and a platelet count of 231.  Absolute lymphocyte count was 6027.    Bone marrow biopsy performed at [facility] on 01/26/2020 reveals hypercellular marrow with trilineage hematopoiesis and 60% involvement by CLL.  CLL FISH panel unremarkable, and I GVH sequencing has been ordered.  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  Cytogenetics revealed a normal karyotype.    She noticed a new lymph node in the right cervical region on 12/27/2020 and hence she came in for an urgent care visit on 12/27/2020.  She reports that it is mildly painful but she denies any sore throat fever or infections.  She thinks she has a bad tooth and have advised her to see her dentist.    She established with me on 01/23/2020 at [facility] heme-onc clinic.    Interval History: No fever chills or night sweats.  No chest pain or shortness of breath.  No nausea vomiting.    Family history: Sister has cutaneous T-cell lymphoma.    Medical History:   Diagnosis Date    Arthritis      Surgical History:   Procedure Laterality Date    COLONOSCOPY       Family History   Problem Relation Age of Onset    Arthritis-rheumatoid Mother     Cancer Sister     Heart Disease Sister     Thyroid Disease Sister     Asthma Son     Diabetes Son      Social History     Socioeconomic History    Marital status: Married   Tobacco Use    Smoking status: Former     Types: Cigarettes     Quit date: 1988     Years since quitting: 36.1    Smokeless tobacco: Never   Substance and Sexual Activity    Alcohol use: Yes    Drug use: Never  Review of Systems   Constitutional: Negative.    HENT: Negative.     Eyes: Negative.    Respiratory: Negative.     Cardiovascular: Negative.    Gastrointestinal: Negative.    Genitourinary: Negative.    Musculoskeletal: Negative.    Skin: Negative.    Neurological: Negative.    Psychiatric/Behavioral: Negative.       Objective:          ALPRAZolam (XANAX) 0.5 mg tablet Take one tablet by mouth daily.    atorvastatin (LIPITOR) 10 mg tablet TAKE 1 TABLET BY MOUTH ONCE DAILY FOR 90 DAYS    lisinopril (ZESTRIL) 5 mg tablet Take two tablets by mouth daily.     Vitals:    01/26/23 1309   BP: (!) 152/88   BP Source: Arm, Left Upper   Pulse: 103   Temp: 36.6 ?C (97.8 ?F)   Resp: 20   SpO2: 99%   TempSrc: Oral   PainSc: Zero   Weight: 81.3 kg (179 lb 3.2 oz)   Height: 171.6 cm (5' 7.56)       Body mass index is 27.6 kg/m?Marland Kitchen     Physical Exam  Constitutional:       Appearance: She is well-developed.   HENT:      Head: Normocephalic and atraumatic.   Cardiovascular:      Rate and Rhythm: Normal rate.      Heart sounds: Normal heart sounds.   Pulmonary:      Effort: Pulmonary effort is normal.      Breath sounds: Normal breath sounds.   Abdominal:      Palpations: Abdomen is soft.      Tenderness: There is no abdominal tenderness.   Musculoskeletal:         General: No tenderness.      Cervical back: Neck supple.   Skin:     General: Skin is warm and dry.   Neurological:      Mental Status: She is alert and oriented to person, place, and time.       Lymphatics: Left cervical lymph node measuring 2 cm palpable.  No other lymphadenopathy.  No hepatosplenomegaly.     Assessment and Plan:    1.  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  CLL-IPI Low risk: 91 and 87 percent five- and ten-year OS; median not reached.    Bone marrow biopsy performed at [facility] on 01/26/2020 reveals hypercellular marrow with trilineage hematopoiesis and 60% involvement by CLL.  CLL FISH panel and I GVH sequencing has been ordered.  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  Cytogenetics revealed a normal karyotype.  Fish studies on the bone marrow is negative for deletion of MYB, ATM, TP53, RB1, 13q14.3, and lamp 1, for trisomy 12, IGH-CCND1 (11;14) fusion, and for IGH rearrangement.  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  B2 microglobulin 2.5 on 02/16/2020.     Leukocytosis with absolute lymphocytosis and left cervical lymphadenopathy since May 2019.    WBC 12.3, hemoglobin 14.1 and platelet count 231 on 05/12/2018.  WBC 28.6, hemoglobin 14.4 and platelet count 229 on 01/12/2020.  WBC 27.0, hemoglobin 13.9 and platelet count 237 on 01/26/2020.  WBC 24.6 on 02/16/2020.  Hemoglobin 14.1 and platelet count 228.  CBC on 05/10/2020 reveals hemoglobin 13.6, WBC 27.4 and platelet count of 191.  Absolute lymphocyte count is 19.2.  WBC worse at 34.7  on 11/12/2020.  Hemoglobin normal at 14.2 and platelet count normal at 204.    She had one FNA on a lymph node 01/09/2020 in her neck that per her was inconclusive with evidence of predominantly small lymphocytes raising the possibility of a neoplastic process, so ENT had recommended for an excisional lymph node biopsy.    Discussed in detail prognosis and treatment options of CLL and I explained in detail and I also discussed the role of chemotherapy in patients with CLL.      CLL-IPI -- The International CLL-IPI working group has proposed an international prognostic index for patients with CLL, which incorporates data regarding disease stage, biology, and patient-related factors. This staging system may help to assign patients with higher or lower risk CLL to particular interventions in clinical trials in order to get homogeneous study groups.  The scoring system was developed and validated using data from [id number] previously untreated patients enrolled on one of eight phase 3 clinical trials completed between 1997 and 2007 [123]. Five factors were identified as independent predictors of short survival on multivariate analysis and assigned a weighted score:  ?Del(17p) or TP53 mutation - 4 points  ?Serum B2M >3.5 mg/L - 2 points   ?Unmutated IGHV - 2 points  ?Rai stage I to IV (table 1) or Binet stage B or C (table 2) - 1 point  ?Age >65 years - 1 point  Analysis of these five factors resulted in a final prognostic score, which was able to identify four risk groups with significantly different estimated rates of OS in the internal validation dataset:  ?Low risk (0 to 1 points) - 91 and 87 percent five- and ten-year OS; median not reached  ?Intermediate risk (2 to 3 points) - 80 and 40 percent five- and ten-year OS; median 104 months  ?High risk (4 to 6 points) - 53 and 16 percent five- and ten-year OS; median 63 months  ?Very high risk (7 to 10 points) - 19 and 0 percent five- and ten-year OS; median 31 months  The type of first-line treatment was not identified as an independent factor in this analysis; however, the studies did not include treatment with novel oral inhibitors (eg, idelalisib, ibrutinib, venetoclax), which have demonstrated activity in patients with del(17p) or TP53 mutations. The ability of this scoring system to prognosticate OS and time to first treatment has also been validated in independent cohorts of patients with newly diagnosed and previously untreated patients with CLL     I reviewed the CLL-IPI prognostic group and I also discussed the survival data as above.  She falls in the low risk category with excellent prognosis.     Labs from 05/10/2020 revealed that the WBC is slightly worse.  Hemoglobin and platelet counts are normal.    WBC worse at 27.9 on 08/09/2020.  Hemoglobin 14.1 and platelet count 195.  Plan for follow-up CBC in 3 months.  WBC worse at 34.7 on 11/12/2020.  Hemoglobin normal at 14.2 and platelet count normal at 204.    She noticed a new lymph node in the right cervical region on 12/27/2020 and hence she came in for an urgent care visit on 12/27/2020.  She reports that it is mildly painful but she denies any sore throat fever or infections.  She thinks she has a bad tooth and have advised her to see her dentist.  I advised her to keep a watch on this and call again if this any significant worsening.  I also gave  her the option of another biopsy and she would like to defer it for now.    I discussed with the patient in detail on 02/25/2021.  She mentions that she had a course of antibiotics at home when she took it in the lymph node shrunk and resolved very quickly.  She subsequently saw her dentist who did not find any abnormalities.  She has not had any recurrence of this right cervical lymph node enlargement.    Labs on 05/09/2021 reveals a WBC of 40.3 which is worse than before.  85% lymphocytes.  Normal hemoglobin at 13.5 and a platelet count 204.   WBC worse at 47.5 on 08/27/2021.  Hemoglobin stable at 12.3 and platelet count 187.  WBC stable at 46.7 on 11/21/2021.  Hemoglobin 13.4 and platelet count 200.  WBC worse at 54.8 on 02/27/2022.  Hemoglobin and platelets continue to stay normal.  WBC worse at 57.9 on 05/28/22. Hb worse at 11.8. Plt 195. SHe has had hemorrhoidal bleeding at times.  WBC worse at 63.7 on 09/08/2022.  Hemoglobin worse at 11.4 and platelet count stable at 210.  WBC worse at 74.6 on 12/25/2022.  Hemoglobin worse at 10.7 and platelet count 210.    She would not meet criteria to initiate treatment for CLL.  I discussed with her the options of BTK inhibitors versus venetoclax plus obinutuzumab.  I reviewed the risks and benefits in detail.  She would like to get a second opinion with Dr. Olevia Bowens before making a decision.  I will consult Dr. Mikey Bussing.    If she chooses to go with the venetoclax regimen she will get induction therapy with Dr. Mikey Bussing.    2.  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    I received a note from patient's ENT physician Dr. Aniceto Boss who discussed with the pathologist again to make sure that this lymph node in the neck is not something different from her manifestation of CLL.  The pathologist confirmed that it is unlikely to be 2 different pathologies and hence agreed to cancel excisional biopsy of the left neck lymph node and proceed with bone marrow biopsy 01/26/20.    3.  s/p Shingrix vaccine, she has second dose pending for which she will follow-up with her primary care physician.    4.  B12 deficiency new.  B12 153 on 09/08/2022.  Started vitamin B12 1000 mcg p.o. daily.  I discussed with her the option of continuing B12 pills versus the option of B12 injections.  She prefers the pills.  I will check for response in 3 months.  If she does not respond then I will change her to the B12 pills.  B12 improved 556 on 12/25/2022, continue B12 supplementation.  I will recheck in 3 months.    5. Low iron saturation - 21% on 12/25/22 -she reports worsening fatigue and hence I asked her to try over-the-counter iron supplementation.    CBC w diff      Latest Ref Rng & Units 12/25/2022    12:45 PM 09/08/2022     1:03 PM 05/28/2022     1:30 PM 02/27/2022    12:53 PM 11/21/2021     2:14 PM   CBC with Diff   White Blood Cells 4.5 - 11.0 K/UL 74.6  63.7  57.9  54.8  46.7    RBC 4.0 - 5.0 M/UL 3.48  3.54  3.65  4.17  4.24    Hemoglobin 12.0 - 15.0 GM/DL 16.1  11.4  11.8  13.3  13.4    Hematocrit 36 - 45 % 33.4  33.9  34.9  40.1  40.9    MCV 80 - 100 FL 96.1  95.9  95.5  96.3  96.6    MCH 26 - 34 PG 30.7  32.1  32.4  31.9  31.5    MCHC 32.0 - 36.0 G/DL 16.1  09.6  04.5  40.9  32.6    RDW 11 - 15 % 13.5  13.7  13.4  13.9  13.9    Platelet Count 150 - 400 K/UL 210  210  195  204  200    MPV 7 - 11 FL 8.2  8.6  8.5  9.4  9.2    Neutrophils 41 - 77 % 7  7  6  9  10     Absolute Neutrophil Count 1.8 - 7.0 K/UL 5.46  4.60  3.40  4.75  4.51    Lymphocytes 24 - 44 % 91  91  92  90  87    Absolute Lymph Count 1.0 - 4.8 K/UL 67.00  57.66  52.97  49.21  40.61    Monocytes 4 - 12 % 2  2  2  1  3     Absolute Monocyte Count 0 - 0.80 K/UL 1.68  1.10  1.32  0.56  1.33    Eosinophils 0 - 5 % 0  0  0  0  0    Absolute Eosinophil Count 0 - 0.45 K/UL 0.23  0.18  0.12  0.13  0.12    Basophils 0 - 2 % 0  0  0  0  0    Absolute Basophil Count 0 - 0.20 K/UL 0.23  0.17  0.14  0.13  0.13                             Total Time Today was 40 minutes in the following activities: Preparing to see the patient, Obtaining and/or reviewing separately obtained history, Performing a medically appropriate examination and/or evaluation, Counseling and educating the patient/family/caregiver, Ordering medications, tests, or procedures, Referring and communication with other health care professionals (when not separately reported), Documenting clinical information in the electronic or other health record, and Care coordination (not separately reported)

## 2023-01-27 ENCOUNTER — Encounter: Admit: 2023-01-27 | Discharge: 2023-01-27 | Payer: MEDICARE

## 2023-01-27 DIAGNOSIS — C9110 Chronic lymphocytic leukemia of B-cell type not having achieved remission: Secondary | ICD-10-CM

## 2023-02-05 ENCOUNTER — Encounter: Admit: 2023-02-05 | Discharge: 2023-02-05 | Payer: MEDICARE

## 2023-02-05 DIAGNOSIS — C9110 Chronic lymphocytic leukemia of B-cell type not having achieved remission: Secondary | ICD-10-CM

## 2023-02-05 DIAGNOSIS — M1990 Unspecified osteoarthritis, unspecified site: Secondary | ICD-10-CM

## 2023-02-05 NOTE — Assessment & Plan Note
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 Angelyn Punt that she has a subacute indication for therapy with development of anemia without clearly reversible causes.  We discussed that there is not an overwhelming urgency to start treatment, but that I do anticipate she would need to start treatment within the next 3 to 6 months.  Optimal standard of care would be with either single agent zanubrutinib or the combination of venetoclax and obinutuzumab.  The combination of venetoclax and obinutuzumab has especially strong PFS and the CLL 14 study in patients with a mutated IgHV status and, in general, this is my preferred strategy for patients in this situation.  We additionally have a clinical study with multiple arms involving a more potent second-generation version of venetoclax, sonrotoclax, that is given in combination with zanubrutinib or in combination with obinutuzumab.  She is potentially interested in enrolling on clinical trial with sonrotoclax and obinutuzumab, which has essentially the same treatment schema as the CLL 14 protocol.  The slots for that study will become available again in May but can be requested now.  She is potentially interested in study therapy.  We have given her consent for consideration today.  If she is interested in enrollment, I will plan on seeing her back formally in May to sign consent and get study related procedures completed.  If she elects to move forward with standard of care therapy, I would recommend peripheral blood FISH testing, a peripheral blood conventional karyotype, and peripheral blood NGS prior to initiation of therapy.  We will arrange for appropriate follow-up with me depending upon the above.  In the meantime she will continue to see Dr. Angelyn Punt.    I have discussed the diagnosis and treatment plan with the patient and she expresses understanding and wishes to proceed.

## 2023-02-05 NOTE — Progress Notes
Name: Lindsey Brown          MRN: [id number]      DOB: November 09, 1956      AGE: 67 y.o.   DATE OF SERVICE: 02/05/2023    Subjective:             Reason for Visit:  New CA Pt      Lindsey Brown is a 67 y.o. female.      Cancer Staging   CLL (chronic lymphocytic leukemia) (HCC)  Staging form: Chronic Lymphocytic Leukemia / Small Lymphocytic Lymphoma, AJCC 8th Edition  - Clinical stage from 02/05/2023: Modified Rai Stage III (Modified Rai risk: High, Lymphocytosis: Present, Adenopathy: Present, Organomegaly: Absent, Anemia: Present, Thrombocytopenia: Absent) - Signed by Violeta Gelinas, MD on 02/05/2023      Lindsey Brown presents today in hematologic consultation for management of CLL at the request of Dr. Angelyn Punt.    She has the following detailed history:  1.  Presented in January 2021 with palpable adenopathy for at least 2 years accompanied by peripheral blood lymphocytosis.  She was referred to Dr. Angelyn Punt.  2.  01/26/2020 bone marrow biopsy showed 60% involvement with CLL.  CLL FISH panel was unremarkable.  Conventional karyotype was diploid.  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    On interview today, I actually do not feel that bad.  Exercise tolerance is at her baseline.  She denies new or progressive lymphadenopathy, fevers, drenching night sweats, unintentional weight loss.  She does note some challenges with blood pressure management and is due to follow-up later this afternoon.  She did take her lisinopril today.    I have reviewed and updated the past medical, social and family histories in the history section and they are up to date as of this visit.    I have extensively reviewed the laboratory, pathology and radiology, both internal and external, and the key findings are summarized above.             Review of Systems   All other systems reviewed and are negative.        Objective:          ALPRAZolam (XANAX) 0.5 mg tablet Take one tablet by mouth daily.    atorvastatin (LIPITOR) 10 mg tablet TAKE 1 TABLET BY MOUTH ONCE DAILY FOR 90 DAYS    lisinopril (ZESTRIL) 5 mg tablet Take two tablets by mouth daily.     Vitals:    02/05/23 1150 02/05/23 1154   BP: (!) 184/90    BP Source: Arm, Left Upper    Pulse: 102    Temp: 37.1 ?C (98.8 ?F)    SpO2: 100%    TempSrc: Temporal    PainSc:  Zero   Weight: 81.5 kg (179 lb 10.8 oz)      Body mass index is 27.68 kg/m?Marland Kitchen     Pain Score: Zero       Fatigue Scale: 0-None    Pain Addressed:  N/A    Patient Evaluated for a Clinical Trial: Patient currently in screening for a treatment clinical trial.     Eastern Cooperative Oncology Group performance status is 0, Fully active, able to carry on all pre-disease performance without restriction.Marland Kitchen     Physical Exam  Vitals and nursing note reviewed.   Constitutional:       General: She is not in acute distress.     Appearance: She is well-developed.   HENT:      Head: Normocephalic.  Eyes:      General: No scleral icterus.     Conjunctiva/sclera: Conjunctivae normal.   Cardiovascular:      Rate and Rhythm: Normal rate and regular rhythm.   Pulmonary:      Effort: Pulmonary effort is normal.      Breath sounds: Normal breath sounds.   Abdominal:      Palpations: Abdomen is soft.   Musculoskeletal:      Cervical back: Neck supple.   Lymphadenopathy:      Comments:   3 x 4 cm left cervical node  2 cm left axillary node  Liver and spleen nonpalpable   Skin:     Findings: No rash.   Neurological:      Mental Status: She is alert.               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 Angelyn Punt that she has a subacute indication for therapy with development of anemia without clearly reversible causes.  We discussed that there is not an overwhelming urgency to start treatment, but that I do anticipate she would need to start treatment within the next 3 to 6 months.  Optimal standard of care would be with either single agent zanubrutinib or the combination of venetoclax and obinutuzumab.  The combination of venetoclax and obinutuzumab has especially strong PFS and the CLL 14 study in patients with a mutated IgHV status and, in general, this is my preferred strategy for patients in this situation.  We additionally have a clinical study with multiple arms involving a more potent second-generation version of venetoclax, sonrotoclax, that is given in combination with zanubrutinib or in combination with obinutuzumab.  She is potentially interested in enrolling on clinical trial with sonrotoclax and obinutuzumab, which has essentially the same treatment schema as the CLL 14 protocol.  The slots for that study will become available again in May but can be requested now.  She is potentially interested in study therapy.  We have given her consent for consideration today.  If she is interested in enrollment, I will plan on seeing her back formally in May to sign consent and get study related procedures completed.  If she elects to move forward with standard of care therapy, I would recommend peripheral blood FISH testing, a peripheral blood conventional karyotype, and peripheral blood NGS prior to initiation of therapy.  We will arrange for appropriate follow-up with me depending upon the above.  In the meantime she will continue to see Dr. Angelyn Punt.    I have discussed the diagnosis and treatment plan with the patient and she expresses understanding and wishes to proceed.

## 2023-02-12 ENCOUNTER — Encounter: Admit: 2023-02-12 | Discharge: 2023-02-12 | Payer: MEDICARE

## 2023-02-12 NOTE — Telephone Encounter
Patient called inquiring on CPT codes for medication to determine possible costs/coverage. Discussed she should call prescription drug coverage plan and inquire if medications are covered and/or what tier they are on for her diagnosis. Discussed ICD 10 code vs CPT code. She also inquired on clinical trial coverage. Reviewed that when treatment decision is made, pre-cert team will begin working on [place] and work together on co-pay assistance if needed. Patient reports part of her decision will be made in relation to [facility] costs. Discussed that unfortunately there is no way to get a final cost until pre-cert process has been completed on a treatment plan. She would like to discuss with someone more about clinical trial costs/process. I let her know we would reach out to a coordinator to contact her.

## 2023-02-20 ENCOUNTER — Encounter: Admit: 2023-02-20 | Discharge: 2023-02-20 | Payer: MEDICARE

## 2023-02-21 IMAGING — CR WRISTCMRT
3 series · 3 of 3 positions shown · non-contrast
Comparison: No relevant prior studies available.

DIAGNOSTIC STUDIES

EXAM:  XR RIGHT WRIST COMPLETE, 3 OR MORE VIEWS  ([id number])
INDICATION: right wrist pain x 1 week fall last week
TECHNIQUE: 3 or more views of the right wrist.

[wrist pa]
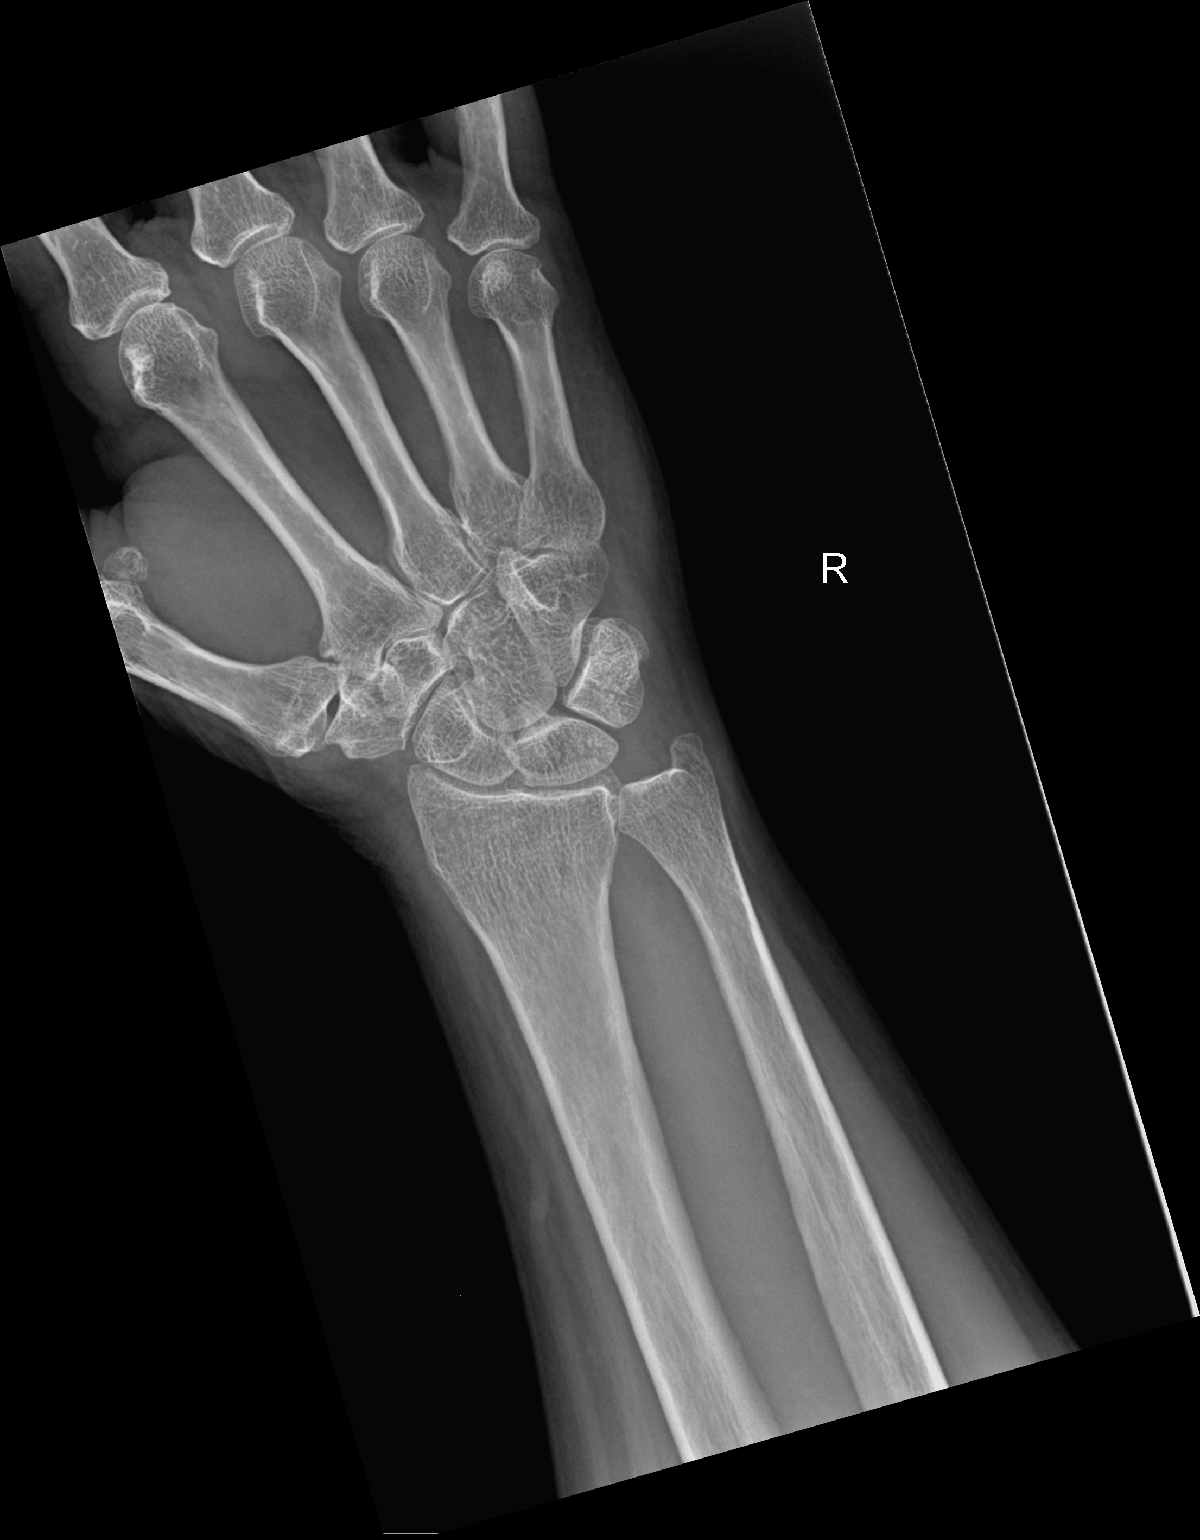

[wrist obl]
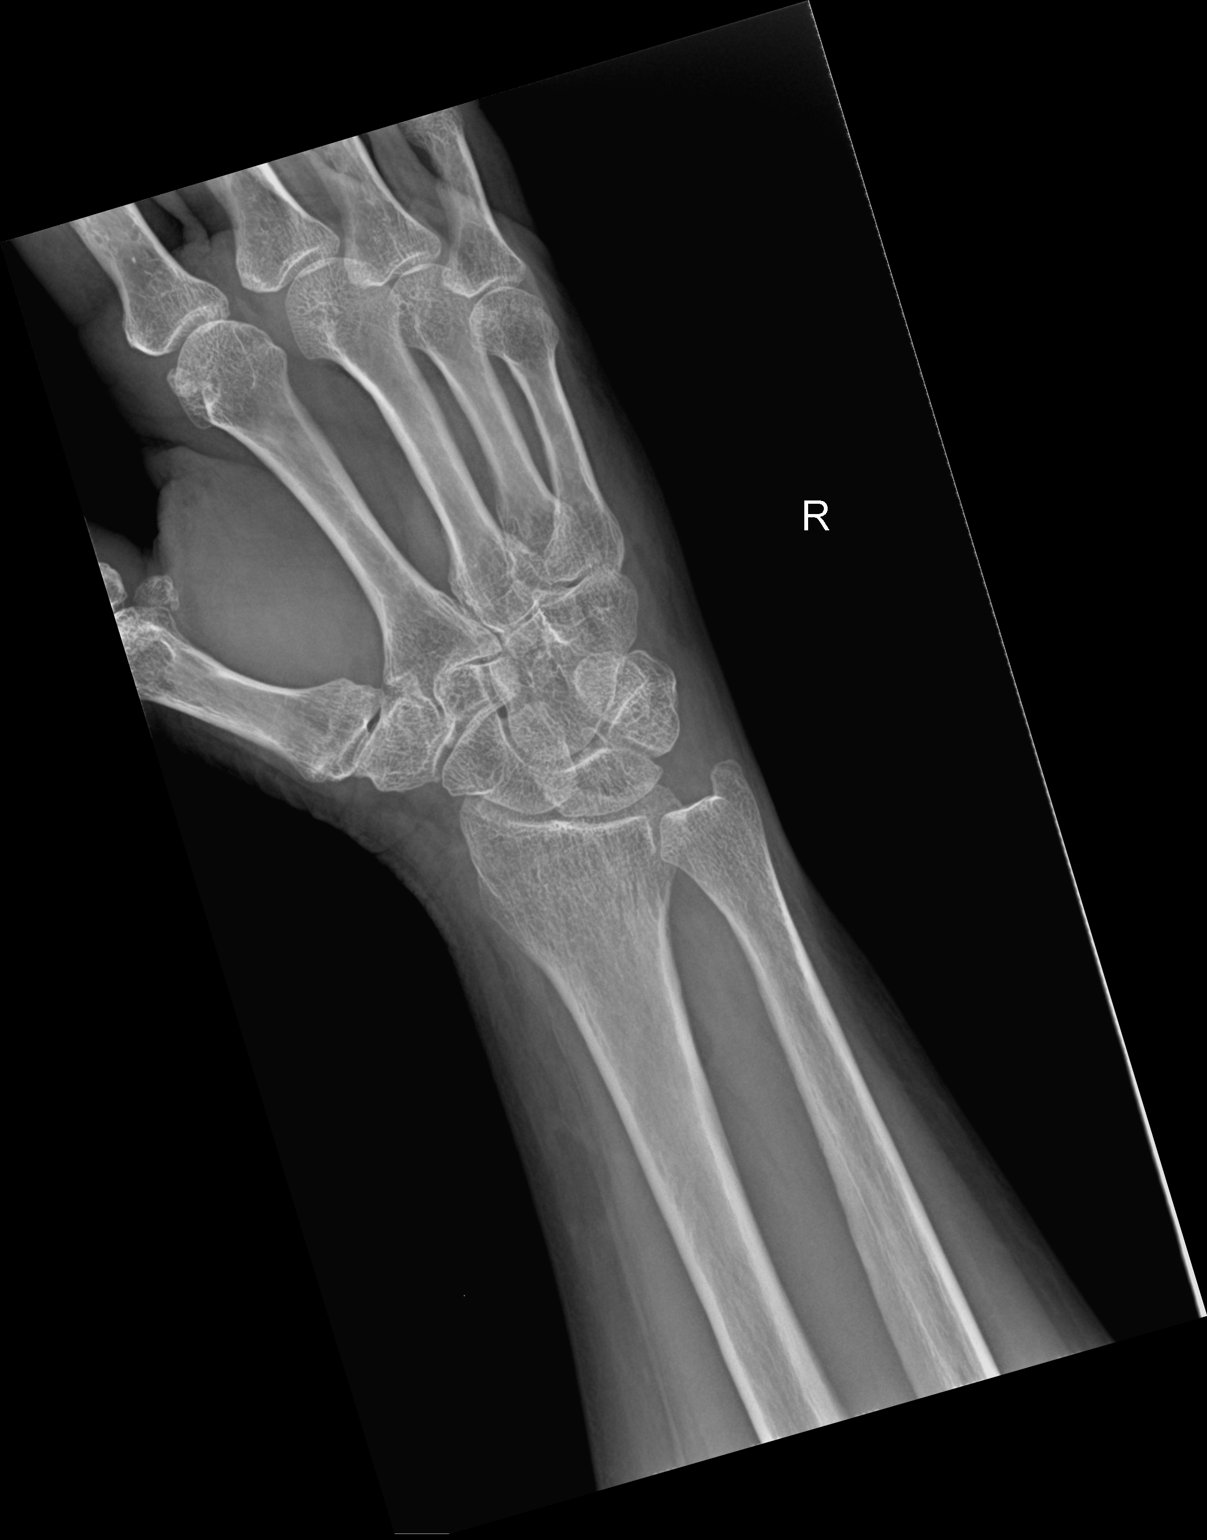

[wrist lat]
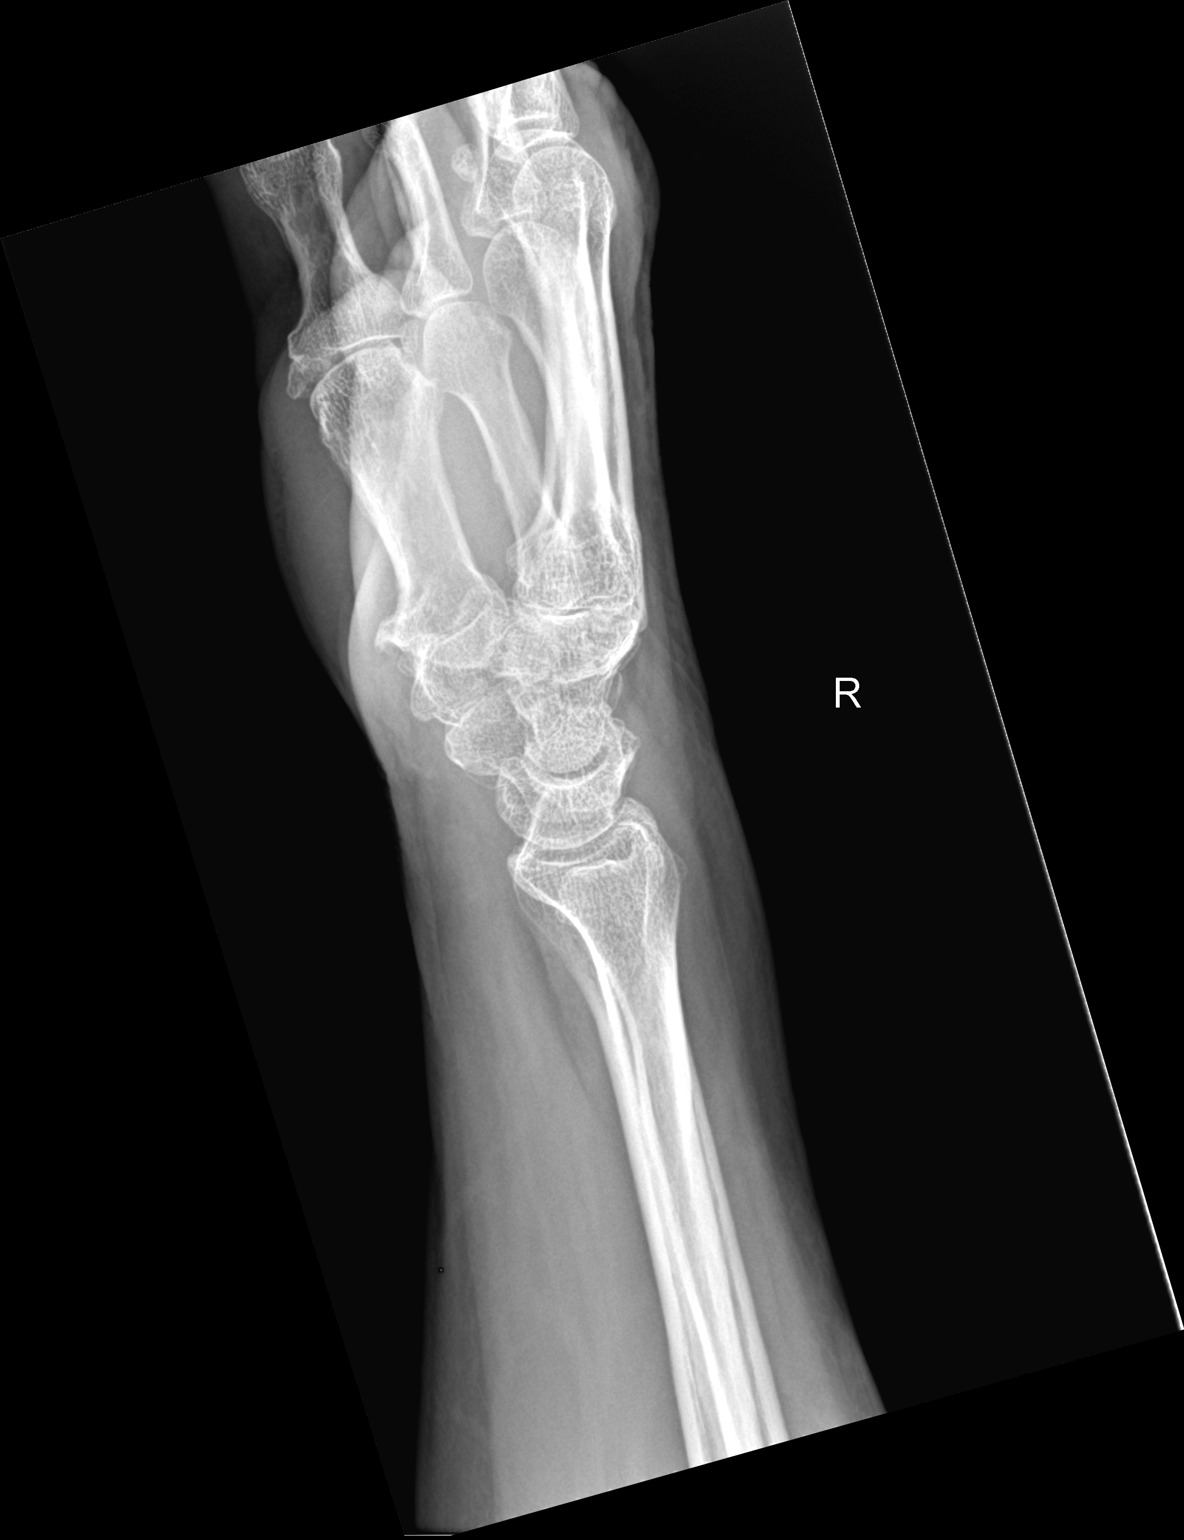

[3 of 3 positions shown; findings below may reference images not displayed]

FINDINGS: BONES/JOINTS:  No definite acute fracture.

SOFT TISSUES:  NO radiopaque foreign body.
IMPRESSION: - NO acute fractures.

Tech Notes:

fall last week

## 2023-03-03 ENCOUNTER — Encounter: Admit: 2023-03-03 | Discharge: 2023-03-03 | Payer: MEDICARE

## 2023-03-03 ENCOUNTER — Ambulatory Visit: Admit: 2023-03-03 | Discharge: 2023-03-03 | Payer: MEDICARE

## 2023-03-03 DIAGNOSIS — R011 Cardiac murmur, unspecified: Secondary | ICD-10-CM

## 2023-03-19 ENCOUNTER — Encounter: Admit: 2023-03-19 | Discharge: 2023-03-19 | Payer: MEDICARE

## 2023-03-20 ENCOUNTER — Encounter: Admit: 2023-03-20 | Discharge: 2023-03-20 | Payer: MEDICARE

## 2023-03-20 DIAGNOSIS — C9110 Chronic lymphocytic leukemia of B-cell type not having achieved remission: Secondary | ICD-10-CM

## 2023-03-20 LAB — CBC AND DIFF
HEMATOCRIT: 27 % — ABNORMAL LOW (ref 36–45)
HEMOGLOBIN: 9.3 g/dL — ABNORMAL LOW (ref 12.0–15.0)
MCH: 32 pg (ref 26–34)
MCHC: 33 g/dL (ref 32.0–36.0)
MCV: 94 FL (ref 80–100)
MPV: 8.8 FL (ref 7–11)
PLATELET COUNT: 190 K/UL (ref 150–400)
RBC COUNT: 2.8 M/UL — ABNORMAL LOW (ref 4.0–5.0)
RDW: 14 % (ref 11–15)
WBC COUNT: 61 K/UL — ABNORMAL HIGH (ref 4.5–11.0)

## 2023-03-20 LAB — FOLATE, SERUM: SERUM FOLATE: 11 ng/mL (ref >3.9)

## 2023-03-20 LAB — IRON + BINDING CAPACITY + %SAT+ FERRITIN
FERRITIN: 134 ng/mL (ref 10–200)
IRON BINDING: 328 ug/dL (ref 270–380)
IRON: 113 ug/dL (ref 50–160)

## 2023-03-20 LAB — VITAMIN B12: VITAMIN B12: 617 pg/mL (ref 180–914)

## 2023-03-26 ENCOUNTER — Encounter: Admit: 2023-03-26 | Discharge: 2023-03-26 | Payer: MEDICARE

## 2023-03-26 DIAGNOSIS — C9110 Chronic lymphocytic leukemia of B-cell type not having achieved remission: Secondary | ICD-10-CM

## 2023-03-26 NOTE — Telephone Encounter
Sent e-fax request to patient's PCP office, Dr. Roseanne Reno, for most recent office note and lab reports.     Appt scheduled with Dr. Paulene Floor at [facility] on 4/9 at 8:40 AM.

## 2023-03-26 NOTE — Telephone Encounter
Spoke withpatient regarding appointment with Dr. Paulene Floor next week to discuss treatment plan re: need for [facility] treatment vs. Clinical trial enrollment.     Patient in agreement with plan for meeting on Tuesday at [facility] location.

## 2023-03-26 NOTE — Telephone Encounter
Called patient and discussed upcoming appointment with Dr. Mikey Brown and that since he will be assuming care we will cancel her upcoming appointment with Dr. Angelyn Punt so her care can be managed by one oncologist. Patient agreeable and verbalized understanding.

## 2023-03-31 ENCOUNTER — Inpatient Hospital Stay: Admit: 2023-03-31 | Discharge: 2023-03-31 | Payer: MEDICARE

## 2023-03-31 ENCOUNTER — Encounter: Admit: 2023-03-31 | Discharge: 2023-03-31 | Payer: MEDICARE

## 2023-03-31 DIAGNOSIS — C9110 Chronic lymphocytic leukemia of B-cell type not having achieved remission: Secondary | ICD-10-CM

## 2023-03-31 DIAGNOSIS — J9601 Acute respiratory failure with hypoxia: Secondary | ICD-10-CM

## 2023-03-31 DIAGNOSIS — M1990 Unspecified osteoarthritis, unspecified site: Secondary | ICD-10-CM

## 2023-03-31 DIAGNOSIS — R509 Fever, unspecified: Secondary | ICD-10-CM

## 2023-03-31 DIAGNOSIS — D61818 Other pancytopenia: Secondary | ICD-10-CM

## 2023-03-31 DIAGNOSIS — D72829 Elevated white blood cell count, unspecified: Secondary | ICD-10-CM

## 2023-03-31 LAB — PROCALCITONIN: PROCALCITONIN: 0.2 ng/mL

## 2023-03-31 LAB — RVP VIRAL PANEL PCR: HUMAN METAPNEUMOVIRUS: DETECTED — AB

## 2023-03-31 LAB — CBC AND DIFF
BAND NEUTROPHIL: 3 % (ref 0–10)
HEMATOCRIT: 27 % — ABNORMAL LOW (ref 36–45)
MCHC: 31 g/dL — ABNORMAL LOW (ref 32.0–36.0)
MCV: 94 FL — ABNORMAL LOW (ref 80–100)
NEUTROPHILS,SEG: 11 % — ABNORMAL LOW (ref 41–77)
RBC COUNT: 2.8 M/UL — ABNORMAL LOW (ref 4.0–5.0)

## 2023-03-31 LAB — BASIC METABOLIC PANEL
ANION GAP: 14 /HPF — ABNORMAL HIGH (ref 3–12)
BLD UREA NITROGEN: 28 mg/dL — ABNORMAL HIGH (ref 7–25)
CALCIUM: 7.3 mg/dL — ABNORMAL LOW (ref 8.5–10.6)
CHLORIDE: 107 MMOL/L — ABNORMAL HIGH (ref 98–110)
CO2: 15 MMOL/L — ABNORMAL LOW (ref 21–30)
CREATININE: 2.3 mg/dL — ABNORMAL HIGH (ref 0.4–1.00)
EGFR: 22 mL/min — ABNORMAL LOW (ref >60)
GLUCOSE,PANEL: 167 mg/dL — ABNORMAL HIGH (ref 70–100)
SODIUM: 136 MMOL/L — ABNORMAL LOW (ref 137–147)

## 2023-03-31 LAB — COMPREHENSIVE METABOLIC PANEL
CHLORIDE: 103 MMOL/L (ref 98–110)
GLUCOSE,PANEL: 112 mg/dL — ABNORMAL HIGH (ref 70–100)
POTASSIUM: 3.5 MMOL/L (ref 3.5–5.1)

## 2023-03-31 LAB — MAGNESIUM: MAGNESIUM: 1.8 mg/dL (ref 1.6–2.6)

## 2023-03-31 MED ORDER — LACTATED RINGERS IV SOLP
1000 mL | Freq: Once | INTRAVENOUS | 0 refills | Status: CP
Start: 2023-03-31 — End: ?
  Administered 2023-04-01: 03:00:00 1000 mL via INTRAVENOUS

## 2023-03-31 MED ORDER — ONDANSETRON HCL (PF) 4 MG/2 ML IJ SOLN
4 mg | INTRAVENOUS | 0 refills | Status: DC | PRN
Start: 2023-03-31 — End: 2023-04-07
  Administered 2023-04-01: 03:00:00 4 mg via INTRAVENOUS

## 2023-03-31 MED ORDER — CYANOCOBALAMIN (VITAMIN B-12) 500 MCG PO TAB
500 ug | Freq: Every day | ORAL | 0 refills | Status: DC
Start: 2023-03-31 — End: 2023-04-07
  Administered 2023-04-01 – 2023-04-07 (×7): 500 ug via ORAL

## 2023-03-31 MED ORDER — CEFTRIAXONE INJ 2GM IVP
2 g | INTRAVENOUS | 0 refills | Status: DC
Start: 2023-03-31 — End: 2023-04-02
  Administered 2023-04-01 – 2023-04-02 (×2): 2 g via INTRAVENOUS

## 2023-03-31 MED ORDER — DOXYCYCLINE HYCLATE 100 MG PO TAB
100 mg | Freq: Two times a day (BID) | ORAL | 0 refills | Status: DC
Start: 2023-03-31 — End: 2023-04-03
  Administered 2023-04-01 – 2023-04-02 (×5): 100 mg via ORAL

## 2023-03-31 MED ORDER — SODIUM CHLORIDE 0.9 % IV SOLP
2000 mL | Freq: Once | INTRAVENOUS | 0 refills | Status: CP
Start: 2023-03-31 — End: ?
  Administered 2023-03-31: 20:00:00 2000 mL via INTRAVENOUS

## 2023-03-31 MED ORDER — MELATONIN 5 MG PO TAB
5 mg | Freq: Every evening | ORAL | 0 refills | Status: DC | PRN
Start: 2023-03-31 — End: 2023-04-07
  Administered 2023-04-01 – 2023-04-06 (×3): 5 mg via ORAL

## 2023-03-31 MED ORDER — ONDANSETRON HCL (PF) 4 MG/2 ML IJ SOLN
8 mg | Freq: Once | INTRAVENOUS | 0 refills | Status: CP
Start: 2023-03-31 — End: ?
  Administered 2023-03-31: 20:00:00 8 mg via INTRAVENOUS

## 2023-03-31 MED ORDER — CEFEPIME 1G/100ML NS IVPB (MB+)
1 g | Freq: Once | INTRAVENOUS | 0 refills | Status: CP
Start: 2023-03-31 — End: ?
  Administered 2023-03-31 (×2): 1 g via INTRAVENOUS

## 2023-03-31 MED ORDER — ONDANSETRON HCL (PF) 4 MG/2 ML IJ SOLN
8 mg | Freq: Once | INTRAVENOUS | 0 refills | Status: CN
Start: 2023-03-31 — End: ?

## 2023-03-31 MED ORDER — ALBUTEROL SULFATE 90 MCG/ACTUATION IN HFAA
2 | RESPIRATORY_TRACT | 0 refills | Status: DC | PRN
Start: 2023-03-31 — End: 2023-04-01

## 2023-03-31 MED ORDER — POLYETHYLENE GLYCOL 3350 17 GRAM PO PWPK
1 | Freq: Every day | ORAL | 0 refills | Status: DC | PRN
Start: 2023-03-31 — End: 2023-04-07

## 2023-03-31 MED ORDER — SENNOSIDES-DOCUSATE SODIUM 8.6-50 MG PO TAB
1 | Freq: Every day | ORAL | 0 refills | Status: DC | PRN
Start: 2023-03-31 — End: 2023-04-07
  Administered 2023-04-01: 14:00:00 1 via ORAL

## 2023-03-31 MED ORDER — SODIUM CHLORIDE 0.9 % IV SOLP
2000 mL | Freq: Once | INTRAVENOUS | 0 refills | Status: CN
Start: 2023-03-31 — End: ?

## 2023-03-31 MED ORDER — ATORVASTATIN 10 MG PO TAB
10 mg | Freq: Every day | ORAL | 0 refills | Status: DC
Start: 2023-03-31 — End: 2023-04-07
  Administered 2023-04-01 – 2023-04-07 (×8): 10 mg via ORAL

## 2023-03-31 MED ORDER — HEPARIN, PORCINE (PF) 5,000 UNIT/0.5 ML IJ SYRG
5000 [IU] | SUBCUTANEOUS | 0 refills | Status: DC
Start: 2023-03-31 — End: 2023-04-07
  Administered 2023-04-01 – 2023-04-05 (×15): 5000 [IU] via SUBCUTANEOUS

## 2023-03-31 MED ORDER — LACTATED RINGERS IV SOLP
1000 mL | Freq: Once | INTRAVENOUS | 0 refills | Status: CP
Start: 2023-03-31 — End: ?
  Administered 2023-04-01: 04:00:00 1000 mL via INTRAVENOUS

## 2023-03-31 MED ORDER — AMLODIPINE 10 MG PO TAB
10 mg | Freq: Every day | ORAL | 0 refills | Status: DC
Start: 2023-03-31 — End: 2023-04-07
  Administered 2023-04-01 – 2023-04-07 (×7): 10 mg via ORAL

## 2023-03-31 MED ORDER — LEVOFLOXACIN 750 MG PO TAB
750 mg | ORAL_TABLET | ORAL | 0 refills | Status: SS
Start: 2023-03-31 — End: ?

## 2023-03-31 NOTE — H&P (View-Only)
ADMISSION HISTORY AND PHYSICAL      Name:  Lindsey Brown                                             MRN:  [id number]   Admission Date:  03/31/2023    ASSESSMENT & PLAN     Lindsey Brown is a 67 y.o.  female with history of CLL, CKD with unknown baseline creatinine, vitamin B12 deficincy, HTN who presents with shortness of breath and cough and was admitted for acute hypoxic respiratory failure and pneumonia    #Pneumonia  #Human metapneumovirus   #Concern for superimposed bacterial pneumonia  # acute hypoxic respiratory failure  - treated with 2 L IV fluids and cefepime in hematology clinic  - RVP positive for human metapneumovirus 4/9  - Chest 2 view 03/31/23:  ill-defined left greater than right perihilar and bibasilar opacities may reflect multifocal pneumonia. Recommend follow-up 2 view chest radiograph in 6-8 weeks to ensure improvement.   PLAN  > albuterol given wheezing on exam  > repeat CXR in 6-8 weeks  > given tolerance of cefepime several hours after treatment, transition to ceftriaxone and doxycyline    - discussed with pharmacy given PCN allergy  > procalcitonin  > BNP  > MRSA screen, urine strep pneumo and legionella  > sputum culture  > blood culture x 2    #CLL  #Leukocytosis  > CBC with diff daily    #AKI  #CKD - unclear baseline stage  #Proteinuria  - creatinine at [facility] on 06/30/2022 1.18  - unclear baseline creatinine; per outside chart review creatinine 1.42 (02/05/23)  - likely pre-renal in setting of poor oral intake  - treated with 2 L IV fluid in oncology clinic  PLAN  > hold olmesartan  > urinalysis  > BMP on admission   > renal ultrasond  > bladder scan    #Vitamin B12 deficiency  > continue vitamin B12 supplement    #HTN  > hold PTA olmesartan 20 mg with AKI  > PTA amlodpine 10 mg         FEN: no IVF, electrolytes reviewed, No diet orders on file   Prophalyxis:  heparin  Disposition: Admit to inpatient   PT/OT: consulted  Code status: No Order  (Discussed on 03/31/2023) I have discussed the management and  plan of care  with patient as listed above.      Dorann Ou, MD       03/31/2023  Internal Medicine     Pager:  [phone]  Available on voalte               High medical decision making due to the following:  1 acute or chronic illness that poses a threat to life or bodily function  Review of notes outside of my specialty and Ordering of each unique test, independent interpretation of a test (Chest 2 view personally reviewed with multifocal opacities most pronounced on left) , and discussion of management or test interpretation with pharmacy discussion with pcn allergy and antibiotics (physician(s) or other qualified health care professional outside of my specialty)    _______________________________________________________________________________________________________________________________________________    Chief Complaint:  cough, fevers    History of Present Illness     Lindsey Brown is a 67 y.o. female who presents with shortness of breath, cough, fevers. Patient presented  as direct admission from Dr. Modena Slater clinic where she was being seen for CLL. She had symptoms start 4-5 days ago. She is accompaneid by her husband and daughter who help contribute to history. She received her RSV vaccine the day before symptoms. SYmptoms initially started with cough. They she developed fever on Friday/Saturday. Max temperature was 102. She treated this with tylenol. SHe has had productive cough with yellow sputum. In addition she has had post-tussive emsis and poor PO intake. She denies any history of blood clots, unlaterla leg swelling, hemopysis. She woke up with eyeid swelling this morning.     She had cefepime in clinic today and she tolerated this well. She has reported abx allergy to penicillin. Reaction is \\hives  with penicillins. She again denies any reaction to     Diarrhea - only had it once after eating. Ate a hamburger had diarrhea right after eating it    Hungry but has had poor po intake   Dry mouth today    HOme medications  Vitamin D, E, iron  Blood pressure pill - unknown  Vitamin b12    Review of Systems   A comprehensive 14-point ROS  was negative except for:      Review of Systems   Constitutional:  Positive for chills, fever and malaise/fatigue.   HENT:  Positive for congestion. Negative for sore throat.    Respiratory:  Positive for cough, sputum production and shortness of breath. Negative for hemoptysis.    Cardiovascular:  Positive for chest pain (from coughing) and leg swelling (bilateral ankle swelling).   Gastrointestinal:  Positive for diarrhea, nausea and vomiting. Negative for blood in stool.       Past medical history    has a past medical history of Arthritis.     Past surgical history    has a past surgical history that includes colonoscopy.       Family history     Family History   Problem Relation Age of Onset    Arthritis-rheumatoid Mother     Cancer Sister     Heart Disease Sister     Thyroid Disease Sister     Asthma Son     Diabetes Son      Social history     Social History     Socioeconomic History    Marital status: Married   Tobacco Use    Smoking status: Former     Current packs/day: 0.00     Types: Cigarettes     Quit date: 1988     Years since quitting: 36.2    Smokeless tobacco: Never   Substance and Sexual Activity    Alcohol use: Yes    Drug use: Never        Allergies   Pcn [penicillins]    Medications     No current facility-administered medications for this encounter.          Physical exam     BP: 132/65 (04/09 1405)  Temp: 36.9 ?C (98.4 ?F) (04/09 1405)  Pulse: 110 (04/09 1405)  SpO2: 92 % (04/09 1520)  O2 Device: Nasal cannula (04/09 1520)  O2 Liter Flow: 3 Lpm (04/09 1520) BP: (110-132)/(65-67)   Temp:  [36.9 ?C (98.4 ?F)]   Pulse:  [110]   SpO2:  [85 %-92 %]   O2 Device: Nasal cannula  O2 Liter Flow: 3 Lpm  There were no vitals filed for this visit.     General:   Alert and response to  question appropriately. VS as above. No acute distress, patient is resting comfortably in bed.   Head:  Normocephalic atraumatic.  Eyes: No scleral icterus.  No conjunctival injection.   Lungs:  Normal work of breathing on 3 L NC.   Cardiovascular:  Regular rate, S1, S2 normal, no murmurs, clicks, rubs, or gallops appreciated .  2+ and symmetric, all extremities.  1+ ankle edema bilaterally  Abdomen:  BS+, soft, no guarding or rigidity.  Nontender to palpation without palpable masses.  No rebound tenderness or guarding.   Skin:  Skin color normal, no obvious evidence of rashes.   Neurologic:   Alerted and oriented x 4. Fluent speech  Vital Signs Reviewed      Labs        Recent Labs     03/31/23  1032   NA 137   K 3.5   CL 103   CO2 25   BUN 24   CR 2.27*   GAP 9   ALBUMIN 3.2*   TOTBILI 0.5   AST 19   ALT 12    Recent Labs     03/31/23  1032   WBC 56.3*   HGB 8.6*   HCT 27.1*   PLTCT 195   MCV 94.8          Radiology and Other Diagnostic Procedures Review     CHEST 2 VIEWS    Result Date: 03/31/2023  Ill-defined left greater than right perihilar and bibasilar opacities may reflect multifocal pneumonia. Recommend follow-up 2 view chest radiograph in 6-8 weeks to ensure improvement. No pleural effusion. Normal mediastinal contours and heart size.  Finalized by Elfredia Nevins, M.D. on 03/31/2023 11:43 AM. Dictated by Elfredia Nevins, M.D. on 03/31/2023 11:40 AM.     Pertinent radiology reviewed

## 2023-03-31 NOTE — Progress Notes
@  GGYIRSWN@   Cancer Center Transfer of Care   Transfer Type: To Hospital- Direct Admit (via Personal vehicle)   Transferring Provider: Dr. Paulene Floor   Phone or Pager #: [phone]   Reason for Transfer: Pneumonia with hypoxia/new O2 requirement   Hand off / Report Report not called, no bed assignment available   Cancer Diagnosis: CLL   Is patient  part of a clinical trial? No   Clinical Research Coordinator Name:  Number:   Principal Investigator/ Treating Physician Name:   Pager:    Biohazardous drug exposure (if any): No   Isolation Status: Droplet and Contact   Last Vital Signs: Vitals:    03/31/23 1520   BP:    Pulse:    Temp:    Resp:    SpO2: 92%   O2 Device: Nasal cannula   O2 Liter Flow: 3 Lpm      Oxygen Needs: O2 per NC- 3LPM   Mental Status: alert   IV/Port Access: Peripheral (22 ga)   Line Status: Patent    Ambulation Status: Ambulatory with Assistance   Meds Given Prior to Transfer: Medications   sodium chloride 0.9 %   infusion (0 mL Intravenous Infusion Stopped 03/31/23 1520)   ondansetron HCL (PF) (ZOFRAN (PF)) injection 8 mg (8 mg Intravenous Given 03/31/23 1445)   cefepime (MAXIPIME) 1 g in sodium chloride 0.9% (NS) 100 mL IVPB (MB+) (0 g Intravenous Infusion Stopped 03/31/23 1645)      Labs Collected Prior to Transfer: CBC and Comprehensive Metabolic Panel (CMP)   Bed assigned No

## 2023-03-31 NOTE — Response Teams
Rapid response activated in admissions for oxygen concerns. Placed on 3L NC. SpO2>94% for 10 minutes. Patient to be taken to [facility] when room is clean.

## 2023-03-31 NOTE — Progress Notes
Name: Lindsey Brown          MRN: [id number]      DOB: 20-Apr-1956      AGE: 67 y.o.   DATE OF SERVICE: 03/31/2023    Subjective:             Reason for Visit:  Follow Up      Lindsey Brown is a 67 y.o. female.      Cancer Staging   CLL (chronic lymphocytic leukemia) (HCC)  Staging form: Chronic Lymphocytic Leukemia / Small Lymphocytic Lymphoma, AJCC 8th Edition  - Clinical stage from 02/05/2023: Modified Rai Stage III (Modified Rai risk: High, Lymphocytosis: Present, Adenopathy: Present, Organomegaly: Absent, Anemia: Present, Thrombocytopenia: Absent) - Signed by Violeta Gelinas, MD on 02/05/2023      Lindsey Brown presents today in hematologic consultation for management of CLL at the request of Dr. Angelyn Punt.    She has the following detailed history:  1.  Presented in January 2021 with palpable adenopathy for at least 2 years accompanied by peripheral blood lymphocytosis.  She was referred to Dr. Angelyn Punt.  2.  01/26/2020 bone marrow biopsy showed 60% involvement with CLL.  CLL FISH panel was unremarkable.  Conventional karyotype was diploid.  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  3. In early 2024, she developed progressive anemia which prompted discussion regarding possible treatment options going forward.     On interview today, she reports that she is not doing well.  She has been sick for the last 5 days with fevers of up to 102, cough, weakness, nausea/vomiting/diarrhea.  She did have a sick exposure with her young granddaughter last week who had a little bit of a cold.  She denies any significant weight changes, but she has had a lot of swelling in her legs and is unable to fit into her normal shoes.  She was able to hold down a grilled cheese sandwich and some water starting yesterday.  Her O2 saturation is 91% today in clinic.  She does not report significant shortness of breath.  She took a home COVID test that was negative.               Review of Systems   Constitutional:  Positive for chills, fatigue and fever.   Respiratory:  Positive for cough. Negative for shortness of breath.    Gastrointestinal:  Positive for diarrhea and nausea.   All other systems reviewed and are negative.        Objective:          ALPRAZolam (XANAX) 0.5 mg tablet Take one tablet by mouth daily.    atorvastatin (LIPITOR) 10 mg tablet TAKE 1 TABLET BY MOUTH ONCE DAILY FOR 90 DAYS    levoFLOXacin (LEVAQUIN) 750 mg tablet Take one tablet by mouth every 48 hours.    lisinopril (ZESTRIL) 5 mg tablet Take two tablets by mouth daily.     Vitals:    03/31/23 0846   BP: 110/67   BP Source: Arm, Left Upper   Pulse: 110   Temp: 36.9 ?C (98.4 ?F)   Resp: 16   SpO2: (!) 91%   TempSrc: Temporal   PainSc: Eight   Weight: 82.4 kg (181 lb 9.6 oz)     Body mass index is 27.61 kg/m?Marland Kitchen     Pain Score: Eight  Pain Loc: Abdomen (when coughing)    Fatigue Scale: 9    Pain Addressed:  N/A    Patient Evaluated for a Clinical Trial:  Patient currently in screening for a treatment clinical trial.     [place] Cooperative Oncology Group performance status is 0, Fully active, able to carry on all pre-disease performance without restriction.Marland Kitchen     Physical Exam  Vitals and nursing note reviewed.   Constitutional:       Appearance: She is well-developed. She is ill-appearing.   HENT:      Head: Normocephalic.   Eyes:      General: No scleral icterus.     Conjunctiva/sclera: Conjunctivae normal.   Cardiovascular:      Rate and Rhythm: Normal rate and regular rhythm.   Pulmonary:      Effort: Pulmonary effort is normal. No respiratory distress.   Abdominal:      Palpations: Abdomen is soft.   Musculoskeletal:         General: Swelling present.      Cervical back: Neck supple.      Right lower leg: Edema present.      Left lower leg: Edema present.   Lymphadenopathy:      Comments:   3 x 4 cm left cervical node  2 cm left axillary node  Liver and spleen nonpalpable   Skin:     Findings: No rash.   Neurological:      Mental Status: She is alert.               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   We have obtained basic labs in clinic which show hemoglobin 8.6, WBC 56K, with 83% being lymphocytes. Chemistry reveals a creatinine of 2.2 with a normal BUN and no electrolyte abnormalities. She has not had great PO intake over the last 4-5 days, so likely that some component is dehydration, though BUN is normal. We will plan for 2L IVF today in treatment room.   Chest x-ray obtained 03/31/23 shows ill-defined left greater than right perihilar and bibasilar opacities, which may reflect multifocal pneumonia. Given her symptoms, this is concerning for bacterial PNA, either as primary or secondary after viral URI. Levaquin renally dosed x 5 days. She should take over the counter medications for symptomatic treatment in addition to the antibiotics. If she does not start to feel better, we instructed her to be seen in an ER.   She likely has some underlying CKD, labs to be re-checked after acute illness resolves as this will need to be sorted out when determining best treatment strategy going forward.  We reviewed briefly, optimal standard of care would be with either single agent zanubrutinib or the combination of venetoclax and obinutuzumab.  The combination of venetoclax and obinutuzumab has especially strong PFS and the CLL 14 study in patients with a mutated IgHV status and, in general, this the preferred strategy for patients in this situation.  We will defer definitive treatment decision until after she has recovered from her pneumonia.  We again briefly touched on the clinical trial that will be coming available hopefully in May which will be looking at sonrotoclax and zanubrutinib or  sonrotoclax and obinutuzumab. We will re-address this once she recovers from her acute illness and we determine underlying kidney function and symptom burden related to her CLL.  If she elects to move forward with standard of care therapy, I would recommend peripheral blood FISH testing, a peripheral blood conventional karyotype, and peripheral blood NGS prior to initiation of therapy.  RTC in 2 weeks with Dr. Paulene Floor to sort out timing of treatment plan and with repeat labs.       Patient seen and discussed with Dr. Paulene Floor.     Edd Fabian, DO  PGY-4  Heme onc fellow

## 2023-03-31 NOTE — Progress Notes
Lindsey Brown  67 y.o.  female    Hx: CLL, Hoffmann pt    NEWS Score: 3 (pulse 110, SpO2 92%)  Notes - Patient ambulatory to clinic. Patient with complaints of productive cough x4 days and shortness of breath, had an xray today that showed pneumonia and was sent with levaquin by Dr. Paulene Floor. Labs drawn earlier today, resulted with creatinine 2.27, IVF ordered by Dr. Paulene Floor.   RN assessment on ONC treatment room doc flowsheet.    Medications:  Normal saline 2000 mL IV TRO 2 hours - stopped after 1 hour   Ondansetron 8 mg IV push    VS rechecked with SpO2 85%, patient placed on 3L O2 to reach SpO2 of 92%. Notified Dr. Paulene Floor and per Dr. Paulene Floor, patient will need to be admitted due to new hypoxia. Patient provided with bed assignment. Per Dr. Paulene Floor, will administer IV abx and will not continue the rest of IVF.    1 g IV cefepime administered over 30 minutes.   Per Dr. Modena Slater clinic, patient approved to travel to hospital via personal vehicle. Patient and caregiver verbalized understanding to drive directly to hospital, and feel comfortable with this plan.   After Visit Summary reviewed with patient and patient denies need for copy. Patient has no further questions or concerns at this time.  Patient left clinic via wheelchair with caregiver.    Freddie Apley, RN  03/31/2023

## 2023-03-31 NOTE — Patient Instructions
PATIENT FORM FOR CALLING AFTER HOURS  BMT hours are 7 am-7 pm M-F, 8 am-2 pm on weekends/holidays  TELEPHONE # [phone]  Between 7 am-4:30 pm Monday-Friday, you will talk with the triage nurse.    WHEN THE OPERATOR ANSWERS,  ASK TO BE CONNECTED TO    THE BMT DOCTOR ON CALL.    WHEN TO CALL?  TEMPERATURE OF 100.4 OR GREATER OR CHILLS    DO NOT TAKE TYLENOL OR ADVIL UNLESS OKD BY THE DOCTOR  NAUSEA/VOMITING AND CANNOT KEEP ANTI-NAUSEA MEDICATION DOWN    WATERY DIARRHEA  NEW RASH  NEW CHEST PAIN, NECK PAIN  SEVERE BLEEDING THAT CONTINUES AFTER 15 MINUTES OF DIRECT PRESSURE    WHAT OTHER INFORMATION DOES THE DOCTOR ON CALL WANT?   What type of transplant did you have?  Eg. Auto-own cells, allo-donor cells.  Have you had a bone marrow transplant?  How many days out of transplant you are or your transplant date  What meds you are currently on - any recent med changes  When you were last seen and when you are scheduled to be seen again.   Running a fever, developed a rash, have diarrhea, nausea, vomiting.      Please enter clinic through the Limited Access (LA) door near the elevators if you have:  Fever  Cough  Sore throat  Runny nose  Diarrhea  A pending test for a virus or infection  Been diagnosed with a virus or infection, including C. diff and cold viruses in the last 3 weeks  Are receiving treatment for a virus or infection  Tested positive for COVID in the last 90 days    Continue to check in through LA until your care team informs you that you may enter the waiting room.

## 2023-03-31 NOTE — Care Coordination-Inpatient
AOD Admission Note        Reason for Admission: hypoexemia, concern for pneumonia    Current Location: Hematology Clinic     Referring Provider: Violeta Gelinas, MD     Pre-Admission Course:   See note by Dr. Mikey Bussing for information regarding clinic visit today. Initial concerns for pneumonia with decision to admit following identification of hypoxemia. Cefepime given.     *noted that RVP returned + metapneumovirus following call     Admission Plan:   Continue evaluation and management of hypoxemia with suspected pneumonia  Per referring provider, no hematologic issues need to be addressed this admission  Nurse communication order placed to contact [phone] pager when patient arrives.    Nita Sells, DO      **All above information is per verbal handoff from the referring provider and/or point of contact.  This patient has not been directly evaluated by myself at time of discussion.  Each reported account should be verified by admitting practicioners.  Additional objective information reviewed from prior encounters is outlined in italics.

## 2023-03-31 NOTE — Progress Notes
I have seen, examined, and participated in all medical decision making regarding this patient's care.  I personally reviewed all of their laboratory reports and pertinent imaging as detailed above in the resident's notes.  I agree with the resident documentation as detailed below with the following additions and/or exceptions:    For last several days has been hypoxic and febrile.  Had post-tussive emesis and challenges tolerating solids or liquids.  She is tachycardic and has relative hypotension that is not symptomatic today.  Had a sick contact from her granddaughter last week.    On exam I do hear what sounds like a left lower lobe infiltrate that is suspicious for pneumonia.    Plan:  Check chest x-ray  Levaquin 750 mg p.o. today.  We will need to sort out the Levaquin dosing based upon her renal function  Check CBC and CMP.  Check RVP  Administer 2 L of IV fluid and 8 mg IV Zofran today in the infusion suite.  I recommend an over-the-counter combination cold pill.  We have given her a prescription for codeine cough syrup as well.  For CLL perspective, my understanding is that she has some renal dysfunction.  This may be exclusionary for her participation on the study.  She is not able to start therapy today due to her acute illness.  If it appears that her renal dysfunction is exclusionary for participation in the study, plan will be to treat her with standard of care venetoclax and obinutuzumab.  She does have an indication for therapy and we will need to sort out timing and initiation of this in the next few weeks.  I will arrange to see her back in about 2 weeks when she hopefully will have recovered from her acute illness.    ADDENDUM:  Several issues.  First, her renal dysfunction will disqualify her from participation in a clinical trial.  We will arrange for obinutuzumab and venetoclax per CLL 14 to start in a couple of weeks.  Second, she developed hypoxemia during fluid administration upstairs.  Her chest x-ray confirms a left lower lobe pneumonia as suspected based upon her physical exam.    Her RVP showed human metapneumovirus and my suspicion is she has a viral pneumonia with bacterial superinfection.  We have arranged for direct admission to medicine with a hematology consult.    She received a dose of cefepime in clinic.  Appropriate antibiotic coverage for community-acquired pneumonia is very reasonable.  We gave her cefepime in clinic simply because that is what we have available, not due to it this being the appropriate antibiotic choice in a nonneutropenic patient who is not septic.

## 2023-04-01 ENCOUNTER — Encounter: Admit: 2023-04-01 | Discharge: 2023-04-01 | Payer: MEDICARE

## 2023-04-01 ENCOUNTER — Inpatient Hospital Stay: Admit: 2023-04-01 | Discharge: 2023-04-01 | Payer: MEDICARE

## 2023-04-01 DIAGNOSIS — M1990 Unspecified osteoarthritis, unspecified site: Secondary | ICD-10-CM

## 2023-04-01 LAB — COMPREHENSIVE METABOLIC PANEL
ALBUMIN: 2.7 g/dL — ABNORMAL LOW (ref 3.5–5.0)
ALK PHOSPHATASE: 83 U/L — ABNORMAL LOW (ref 25–110)
ALT: 9 U/L (ref 7–56)
ANION GAP: 12 10*3/uL — ABNORMAL HIGH (ref 3–12)
AST: 24 U/L (ref 7–40)
CALCIUM: 7.4 mg/dL — ABNORMAL LOW (ref 8.5–10.6)
CO2: 20 MMOL/L — ABNORMAL LOW (ref 21–30)
CREATININE: 2.3 mg/dL — ABNORMAL HIGH (ref 0.4–1.00)
EGFR: 22 mL/min — ABNORMAL LOW (ref >60)
TOTAL PROTEIN: 4.9 g/dL — ABNORMAL LOW (ref 6.0–8.0)

## 2023-04-01 LAB — ECG 12-LEAD
P AXIS: 57 degrees
Q-T INTERVAL: 356 ms
QTC CALCULATION (BAZETT): 477 ms
R AXIS: 8 degrees
T AXIS: 45 degrees

## 2023-04-01 LAB — RETICULOCYTE COUNT
ABSOLUTE RETIC CT: 42 10*3/uL (ref 30–94)
CORRECTED RETIC: 0.9 %
UNCORRECTED RETIC: 1.8 % (ref 0.5–2.0)

## 2023-04-01 LAB — MRSA PNEUMONIA SCREEN

## 2023-04-01 LAB — CBC AND DIFF
ABSOLUTE BASO COUNT: 0 10*3/uL (ref 0–0.20)
ABSOLUTE EOS COUNT: 0.2 10*3/uL (ref 0–0.45)
HEMATOCRIT: 22 % — ABNORMAL LOW (ref 36–45)
HEMOGLOBIN: 7.4 g/dL — ABNORMAL LOW (ref 12.0–15.0)
MCH: 30 pg (ref 26–34)
MCHC: 32 g/dL — ABNORMAL HIGH (ref 32.0–36.0)
MCV: 94 FL (ref 80–100)
NEUTROPHILS %: 8 % — ABNORMAL LOW (ref 41–77)
RBC COUNT: 2.4 M/UL — ABNORMAL LOW (ref 4.0–5.0)
WBC COUNT: 51 10*3/uL — ABNORMAL HIGH (ref 4.5–11.0)

## 2023-04-01 MED ORDER — ONDANSETRON 4 MG PO TBDI
4 mg | ORAL | 0 refills | Status: DC | PRN
Start: 2023-04-01 — End: 2023-04-07
  Administered 2023-04-07: 05:00:00 4 mg via ORAL

## 2023-04-01 MED ORDER — ALBUTEROL SULFATE 90 MCG/ACTUATION IN HFAA
2 | RESPIRATORY_TRACT | 0 refills | Status: DC | PRN
Start: 2023-04-01 — End: 2023-04-07

## 2023-04-01 MED ORDER — ALBUTEROL SULFATE 90 MCG/ACTUATION IN HFAA
2 | RESPIRATORY_TRACT | 0 refills | Status: DC
Start: 2023-04-01 — End: 2023-04-02
  Administered 2023-04-02: 02:00:00 2 via RESPIRATORY_TRACT

## 2023-04-01 NOTE — Progress Notes
General Progress Note    Name: Lindsey Brown        MRN: [id number]          DOB: 05-23-1956            Age: 67 y.o.  Admission Date: 03/31/2023       LOS: 1 day    Date of Service: 04/01/2023    Assessment & Plan      Principal Problem:    Acute hypoxic respiratory failure (HCC)      Lindsey Brown is a 67 y.o.  female with history of CLL, CKD with unknown baseline creatinine, vitamin B12 deficincy, HTN who presents with shortness of breath and cough and was admitted for acute hypoxic respiratory failure and pneumonia.     #Pneumonia  #Human metapneumovirus   #Concern for superimposed bacterial pneumonia  # acute hypoxic respiratory failure  # Tachycardia  - treated with 2 L IV fluids and cefepime in hematology clinic  - RVP positive for human metapneumovirus 4/9  - Chest 2 view 03/31/23:  ill-defined left greater than right perihilar and bibasilar opacities may reflect multifocal pneumonia. Recommend follow-up 2 view chest radiograph in 6-8 weeks to ensure improvement.   -MRSA screen negative    PLAN  > albuterol given wheezing on exam  > repeat CXR in 6-8 weeks  > given tolerance of cefepime several hours after treatment, transition to ceftriaxone and doxycyline                - discussed with pharmacy given PCN allergy  > Urine strep pneumo and legionella  > sputum culture  > blood culture x 2, no growth to date     #CLL  #Leukocytosis  > CBC with diff daily  > Hematology team consulted     #AKI  #CKD - unclear baseline stage  #Proteinuria  - creatinine at [facility] on 06/30/2022 1.18  - unclear baseline creatinine; per outside chart review creatinine 1.42 (02/05/23)  - likely pre-renal in setting of poor oral intake  - treated with 2 L IV fluid in oncology clinic  PLAN  > hold Olmesartan  > renal ultrasond, no hydronephrosis  > If not improved, will consider nephrology consult     #Vitamin B12 deficiency  > continue vitamin B12 supplement     #HTN  > hold PTA olmesartan 20 mg with AKI  > PTA amlodpine 10 mg                                 FEN: PRN  IVF, electrolytes reviewed  Prophalyxis:  heparin  Disposition: Admit to inpatient   PT/OT: consulted  Code status: Full code (Discussed on 03/31/2023)                                            Billing:  Total Time Today was >50 minutes in the following activities: Preparing to see the patient, Obtaining and/or reviewing separately obtained history, Performing a medically appropriate examination and/or evaluation, Counseling and educating the patient/family/caregiver, Ordering medications, tests, or procedures, Documenting clinical information in the electronic or other health record, and Care coordination (not separately reported)          Yehuda Mao, MD  Department of Internal Medicine  Service: Med Private S    Pager [phone]  Voalte is the preferred method of communication.   Please use the Med Private First Call for all patient-related communications.   Personal Voaltes and pagers are not answered at all hours.      ________________________________________________________________________      Subjective     Lindsey Brown is a 67 y.o. female.      Patient seen at bedside. No acute events overnight. Patient reports breathing is weird but better today. She still feels wheezy. Says she has not been eating the best at home, but is eating better now. Reports urinating fine and having bowel movement today. Denies home O2 requirement.       Medications     Scheduled Meds:amLODIPine (NORVASC) tablet 10 mg, 10 mg, Oral, QDAY  atorvastatin (LIPITOR) tablet 10 mg, 10 mg, Oral, QDAY  cefTRIAXone (ROCEPHIN) IVP 2 g, 2 g, Intravenous, Q24H*  cyanocobalamin (vitamin B-12) tablet 500 mcg, 500 mcg, Oral, QDAY  doxycycline hyclate (VIBRACIN) tablet 100 mg, 100 mg, Oral, BID  heparin (porcine) PF syringe 5,000 Units, 5,000 Units, Subcutaneous, Q8H    Continuous Infusions:  PRN and Respiratory Meds:albuterol sulfate Q4H PRN, melatonin QHS PRN, ondansetron (ZOFRAN) IV Q6H PRN, polyethylene glycol 3350 QDAY PRN, sennosides-docusate sodium QDAY PRN        Review of Systems     ROS otherwise negative, aside from what is noted in the HPI.       Objective                            Vital Signs: Last Filed                 Vital Signs: 24 Hour Range   BP: 131/63 (04/10 0408)  Temp: 36.9 ?C (98.5 ?F) (04/10 0408)  Pulse: 110 (04/10 0450)  Respirations: 20 PER MINUTE (04/10 0450)  SpO2: 92 % (04/10 0450)  O2%: 93 % (04/10 0408)  O2 Device: Nasal cannula (04/10 0450)  O2 Liter Flow: 3 Lpm (04/10 0450) BP: (110-153)/(63-67)   Temp:  [36.9 ?C (98.4 ?F)-37.2 ?C (98.9 ?F)]   Pulse:  [105-113]   Respirations:  [18 PER MINUTE-20 PER MINUTE]   SpO2:  [85 %-96 %]   O2%:  [93 %]   O2 Device: Nasal cannula  O2 Liter Flow: 3 Lpm     There were no vitals filed for this visit.            Intake/Output (24h)     No intake or output data in the 24 hours ending 04/01/23 0736          Physical Exam       GEN: Awake, alert, in no distress  CV: RRR, no murmurs, LE edema is absent, extremities are warm and well perfused  PULM: expiratory wheezes heard, faint rales LLL  ABD: normoactive bowel sounds, non-distended and non-tender  NEURO: alert and answers questions appropriately  PSYCH: pleasant affect, thought process linear, goal-directed thinking, thought content normal, hygiene is good  SKIN: warm and dry to touch, no grossly visible rash      Lab Review     24-hour labs:    Results for orders placed or performed during the hospital encounter of 03/31/23 (from the past 24 hour(s))   BASIC METABOLIC PANEL    Collection Time: 03/31/23 10:03 PM   Result Value Ref Range    Sodium 136 (L) 137 - 147 MMOL/L    Potassium 4.3 3.5 - 5.1 MMOL/L  Chloride 107 98 - 110 MMOL/L    CO2 15 (L) 21 - 30 MMOL/L    Anion Gap 14 (H) 3 - 12    Glucose 167 (H) 70 - 100 MG/DL    Blood Urea Nitrogen 28 (H) 7 - 25 MG/DL    Creatinine 9.60 (H) 0.4 - 1.00 MG/DL    Calcium 7.3 (L) 8.5 - 10.6 MG/DL    eGFR 22 (L) >45 mL/min   MAGNESIUM Collection Time: 03/31/23 10:03 PM   Result Value Ref Range    Magnesium 1.8 1.6 - 2.6 mg/dL   PHOSPHORUS    Collection Time: 03/31/23 10:03 PM   Result Value Ref Range    Phosphorus 4.6 (H) 2.0 - 4.5 MG/DL   TSH WITH FREE T4 REFLEX    Collection Time: 03/31/23 10:03 PM   Result Value Ref Range    TSH 2.40 0.35 - 5.00 MCU/ML   NT-PRO-BNP    Collection Time: 03/31/23 10:03 PM   Result Value Ref Range    NT-Pro-BNP 309.0 (H) <125 pg/mL   PROCALCITONIN    Collection Time: 03/31/23 10:03 PM   Result Value Ref Range    Procalcitonin 0.22 ng/mL   CULTURE-BLOOD W/SENSITIVITY    Collection Time: 03/31/23 10:03 PM    Specimen: Peripheral; Blood    ARM, LEFT~FA   Result Value Ref Range    Battery Name BLOOD CULTURE     Report Status PRELIMINARY 04/01/2023     Specimen Description BLOOD BLOOD, PERIPHERAL ARM, LEFT FA     Special Requests No special requests     Culture NO GROWTH 1 DAY    CULTURE-BLOOD W/SENSITIVITY    Collection Time: 03/31/23 10:03 PM    Specimen: Peripheral; Blood    HAND, RIGHT   Result Value Ref Range    Battery Name BLOOD CULTURE     Report Status PRELIMINARY 04/01/2023     Specimen Description BLOOD BLOOD, PERIPHERAL HAND, RIGHT     Special Requests No special requests     Culture NO GROWTH 1 DAY    MRSA PNEUMONIA SCREEN    Collection Time: 03/31/23 10:08 PM    Specimen: Nasal Swab; Flocked Swab   Result Value Ref Range    MRSA Pneumonia PCR       NOT DETECTED  The negative predictive value of this assay for MRSA pneumonia is high.    Discontinuation of anti-MRSA pneumonia therapy is recommended in patients   without additional clinical features that warrant MRSA therapy.  Contact   infectious Diseases or Antimicrobial Stewardship with questions.     CBC AND DIFF    Collection Time: 04/01/23  5:45 AM   Result Value Ref Range    White Blood Cells 51.1 (HH) 4.5 - 11.0 K/UL    RBC 2.42 (L) 4.0 - 5.0 M/UL    Hemoglobin 7.4 (L) 12.0 - 15.0 GM/DL    Hematocrit 40.9 (L) 36 - 45 %    MCV 94.1 80 - 100 FL    MCH 30.6 26 - 34 PG    MCHC 32.5 32.0 - 36.0 G/DL    RDW 81.1 11 - 15 %    Platelet Count 164 150 - 400 K/UL    MPV 8.2 7 - 11 FL    Neutrophils 8 (L) 41 - 77 %    Lymphocytes 91 (H) 24 - 44 %    Monocytes 1 (L) 4 - 12 %    Eosinophils 0 0 - 5 %  Basophils 0 0 - 2 %    Absolute Neutrophil Count 3.98 1.8 - 7.0 K/UL    Absolute Lymph Count 46.38 (H) 1.0 - 4.8 K/UL    Absolute Monocyte Count 0.47 0 - 0.80 K/UL    Absolute Eosinophil Count 0.20 0 - 0.45 K/UL    Absolute Basophil Count 0.09 0 - 0.20 K/UL   COMPREHENSIVE METABOLIC PANEL    Collection Time: 04/01/23  5:45 AM   Result Value Ref Range    Sodium 138 137 - 147 MMOL/L    Potassium 4.1 3.5 - 5.1 MMOL/L    Chloride 106 98 - 110 MMOL/L    Glucose 92 70 - 100 MG/DL    Blood Urea Nitrogen 27 (H) 7 - 25 MG/DL    Creatinine 4.78 (H) 0.4 - 1.00 MG/DL    Calcium 7.4 (L) 8.5 - 10.6 MG/DL    Total Protein 4.9 (L) 6.0 - 8.0 G/DL    Total Bilirubin 0.3 0.3 - 1.2 MG/DL    Albumin 2.7 (L) 3.5 - 5.0 G/DL    Alk Phosphatase 83 25 - 110 U/L    AST (SGOT) 24 7 - 40 U/L    CO2 20 (L) 21 - 30 MMOL/L    ALT (SGPT) 9 7 - 56 U/L    Anion Gap 12 3 - 12    eGFR 22 (L) >60 mL/min         POC Review (24h)     Glucose: 92 (04/01/23 0545)      Radiology Review     Pertinent radiology reviewed.    Yehuda Mao, MD  Department of Internal Medicine  Service: Med Private S  Pager [phone]    Amie Critchley is the preferred method of communication.   Please use the Med Private First Call for all patient-related communications.   Personal Voaltes and pagers are not answered at all hours.

## 2023-04-01 NOTE — Case Management (ED)
Case Management Progress Note    NAME:Lorrayne GINNA SCHUUR                          MRN: [id number]              DOB:Dec 06, 1956          AGE: 67 y.o.  ADMISSION DATE: 03/31/2023             DAYS ADMITTED: LOS: 1 day      Today's Date: 04/01/2023    PLAN: DC planning ongoing    NCM attempt to meet with patient x2 today to complete dc planning assessment and patient unavailable.     Case management will  follow up.        Arbutus Leas, RN

## 2023-04-01 NOTE — Progress Notes
RT Adult Assessment Note    NAME:Lindsey Brown             MRN: [id number]             DOB:02-16-1956          AGE: 67 y.o.  ADMISSION DATE: 03/31/2023             DAYS ADMITTED: LOS: 1 day    Additional Comments:  Impressions of the patient: pt on 2L Nasal in no distress at this time    Intervention(s)/outcome(s): pt does not use albuterol at home, made PRN per protocol   Patient education that was completed: n/a  Recommendations to the care team: n/a    Vital Signs:  Pulse: 84  RR: 18 PER MINUTE  SpO2: 96 %  O2 Device: Nasal cannula  Liter Flow: 2 Lpm  O2%:      Breath Sounds:   All Breath Sounds: Expiratory wheezes;Coarse crackles;Decreased  Respiratory Effort:   Respiratory Effort/Pattern: SOA (Short of air)  Comments:

## 2023-04-01 NOTE — Progress Notes
I have reviewed the notes, assessments, and/or procedures performed by Rutha Bouchard, student, and concur with her/his documentation unless otherwise noted.

## 2023-04-01 NOTE — Consults
Admission Date: 03/31/2023                                                LOS: 1 day    Reason for Consult:  Pt admitted from Hematology clinic. Any additional recs?     Consult type: Co-Management w/Signed Orders    Assessment/Plan     Lindsey Brown is a 67 y.o. female with CLL who was admitted from hematology clinic for acute hypoxic respiratory failure and pneumonia after presentng with cough, fevers, and shortness of breath. RVP positive for Metapneumovirus.    # CLL  > WBC 51   > She requires treatment as outpatient. She is being evaluated by Dr. Paulene Floor for participation in the study.  If not a candidate due to renal dysfunction, plan will be to treat her with standard of care venetoclax and Obinutuzumab.  # Anemia   > Hgb progressively trending down  > Likely 2/2 CLL  > recent iron studies, B12, folate were all normal  # Pneumonia    > RVP positive for metapneumovirus  > CXR: Ill-defined left greater than right perihilar and bibasilar opacities may reflect multifocal pneumonia.   # Renal dysfunction  > unknown baseline  > Renal US unremarkable     Plan  - agree with management of acute infection per primary team, including antibiotics for possible superimposed bacterial pneumonia.  - Due to CLL she is at increased risk of more serious clinical course with the infection.  - will order reticulocyte count.  - follow up with Dr. Paulene Floor after she recovers from acute illness.       Steve Rattler, M.D.  Hematology/Oncology fellow, PGY4.     Thank you for this consult, please page with any questions. Patient seen and discussed with Dr. Lurlean Leyden  ______________________________________________________________________    History of Present Illness: Lindsey Brown is a 67 y.o. female with past medical history noteworthy for CLL who presented with shortness of breath and cough and was admitted from hematology clinic for acute hypoxic respiratory failure and pneumonia. She has been sick for the last 5 days with fevers, cough, weakness, nausea/vomiting/diarrhea. She did report a sick exposure with her young granddaughter last week who had a little bit of a cold.  She reports kidney disease diagnosed within the past year but not sure about her recent baseline creatinine.    Hematologic history:  Presented in January 2021 with palpable adenopathy for at least 2 years accompanied by peripheral blood lymphocytosis.  She was referred to Dr. Angelyn Punt. 01/26/2020 bone marrow biopsy showed 60% involvement with CLL.  CLL FISH panel was unremarkable.  Conventional karyotype was diploid.  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 In early 2024, she developed progressive anemia which prompted discussion regarding possible treatment options going forward.  She was referred to Dr. Mikey Bussing for consultation of CLL treatment.     Medical History:   Diagnosis Date    Arthritis      Surgical History:   Procedure Laterality Date    COLONOSCOPY       Social History     Tobacco Use    Smoking status: Former     Current packs/day: 0.00     Types: Cigarettes     Quit date: 1988     Years since quitting: 36.2    Smokeless tobacco: Never   Vaping  Use    Vaping status: Never Used   Substance and Sexual Activity    Alcohol use: Yes    Drug use: Never     Family History   Problem Relation Age of Onset    Arthritis-rheumatoid Mother     Cancer Sister     Heart Disease Sister     Thyroid Disease Sister     Asthma Son     Diabetes Son      Allergies:  Pcn [penicillins]    Scheduled Meds:albuterol sulfate (PROAIR HFA) inhaler 2 puff, 2 puff, Inhalation, Q6H  amLODIPine (NORVASC) tablet 10 mg, 10 mg, Oral, QDAY  atorvastatin (LIPITOR) tablet 10 mg, 10 mg, Oral, QDAY  cefTRIAXone (ROCEPHIN) IVP 2 g, 2 g, Intravenous, Q24H*  cyanocobalamin (vitamin B-12) tablet 500 mcg, 500 mcg, Oral, QDAY  doxycycline hyclate (VIBRACIN) tablet 100 mg, 100 mg, Oral, BID  heparin (porcine) PF syringe 5,000 Units, 5,000 Units, Subcutaneous, Q8H    Continuous Infusions:  PRN and Respiratory Meds:melatonin QHS PRN, ondansetron Q6H PRN, ondansetron (ZOFRAN) IV Q6H PRN, polyethylene glycol 3350 QDAY PRN, sennosides-docusate sodium QDAY PRN    Review of Systems:  A 14 point review of systems was negative apart from above.      Vital Signs:  Last Filed in 24 hours Vital Signs:  24 hour Range    BP: 115/53 (04/10 0809)  Temp: 37.1 ?C (98.7 ?F) (04/10 0809)  Pulse: 73 (04/10 0824)  Respirations: 18 PER MINUTE (04/10 0809)  SpO2: 92 % (04/10 0824)  O2%: 93 % (04/10 0408)  O2 Device: Nasal cannula (04/10 0912)  O2 Liter Flow: 3 Lpm (04/10 0912) BP: (115-153)/(53-66)   Temp:  [36.9 ?C (98.4 ?F)-37.2 ?C (98.9 ?F)]   Pulse:  [73-113]   Respirations:  [18 PER MINUTE-20 PER MINUTE]   SpO2:  [85 %-96 %]   O2%:  [93 %]   O2 Device: Nasal cannula  O2 Liter Flow: 3 Lpm     Physical Exam:  General appearance: alert, cooperative, no distress  Head: normocephalic, atraumatic  Lungs: Coughing intermittently. On supplemental O2. Bilateral hoarse breath sounds   Heart: regular rate and rhythm  Abdomen: nondistended  Neurologic: no focal deficits, AOx3   Skin: No rash    Lab/Radiology/Other Diagnostic Tests:  24-hour labs:    Results for orders placed or performed during the hospital encounter of 03/31/23 (from the past 24 hour(s))   BASIC METABOLIC PANEL    Collection Time: 03/31/23 10:03 PM   Result Value Ref Range    Sodium 136 (L) 137 - 147 MMOL/L    Potassium 4.3 3.5 - 5.1 MMOL/L    Chloride 107 98 - 110 MMOL/L    CO2 15 (L) 21 - 30 MMOL/L    Anion Gap 14 (H) 3 - 12    Glucose 167 (H) 70 - 100 MG/DL    Blood Urea Nitrogen 28 (H) 7 - 25 MG/DL    Creatinine 0.10 (H) 0.4 - 1.00 MG/DL    Calcium 7.3 (L) 8.5 - 10.6 MG/DL    eGFR 22 (L) >93 mL/min   MAGNESIUM    Collection Time: 03/31/23 10:03 PM   Result Value Ref Range    Magnesium 1.8 1.6 - 2.6 mg/dL   PHOSPHORUS    Collection Time: 03/31/23 10:03 PM   Result Value Ref Range    Phosphorus 4.6 (H) 2.0 - 4.5 MG/DL   TSH WITH FREE T4 REFLEX    Collection Time: 03/31/23 10:03 PM Result Value  Ref Range    TSH 2.40 0.35 - 5.00 MCU/ML   NT-PRO-BNP    Collection Time: 03/31/23 10:03 PM   Result Value Ref Range    NT-Pro-BNP 309.0 (H) <125 pg/mL   PROCALCITONIN    Collection Time: 03/31/23 10:03 PM   Result Value Ref Range    Procalcitonin 0.22 ng/mL   CULTURE-BLOOD W/SENSITIVITY    Collection Time: 03/31/23 10:03 PM    Specimen: Peripheral; Blood    ARM, LEFT~FA   Result Value Ref Range    Battery Name BLOOD CULTURE     Report Status PRELIMINARY 04/01/2023     Specimen Description BLOOD BLOOD, PERIPHERAL ARM, LEFT FA     Special Requests No special requests     Culture NO GROWTH 1 DAY    CULTURE-BLOOD W/SENSITIVITY    Collection Time: 03/31/23 10:03 PM    Specimen: Peripheral; Blood    HAND, RIGHT   Result Value Ref Range    Battery Name BLOOD CULTURE     Report Status PRELIMINARY 04/01/2023     Specimen Description BLOOD BLOOD, PERIPHERAL HAND, RIGHT     Special Requests No special requests     Culture NO GROWTH 1 DAY    MRSA PNEUMONIA SCREEN    Collection Time: 03/31/23 10:08 PM    Specimen: Nasal Swab; Flocked Swab   Result Value Ref Range    MRSA Pneumonia PCR       NOT DETECTED  The negative predictive value of this assay for MRSA pneumonia is high.    Discontinuation of anti-MRSA pneumonia therapy is recommended in patients   without additional clinical features that warrant MRSA therapy.  Contact   infectious Diseases or Antimicrobial Stewardship with questions.     CBC AND DIFF    Collection Time: 04/01/23  5:45 AM   Result Value Ref Range    White Blood Cells 51.1 (HH) 4.5 - 11.0 K/UL    RBC 2.42 (L) 4.0 - 5.0 M/UL    Hemoglobin 7.4 (L) 12.0 - 15.0 GM/DL    Hematocrit 44.0 (L) 36 - 45 %    MCV 94.1 80 - 100 FL    MCH 30.6 26 - 34 PG    MCHC 32.5 32.0 - 36.0 G/DL    RDW 10.2 11 - 15 %    Platelet Count 164 150 - 400 K/UL    MPV 8.2 7 - 11 FL    Neutrophils 8 (L) 41 - 77 %    Lymphocytes 91 (H) 24 - 44 %    Monocytes 1 (L) 4 - 12 %    Eosinophils 0 0 - 5 %    Basophils 0 0 - 2 % Absolute Neutrophil Count 3.98 1.8 - 7.0 K/UL    Absolute Lymph Count 46.38 (H) 1.0 - 4.8 K/UL    Absolute Monocyte Count 0.47 0 - 0.80 K/UL    Absolute Eosinophil Count 0.20 0 - 0.45 K/UL    Absolute Basophil Count 0.09 0 - 0.20 K/UL   COMPREHENSIVE METABOLIC PANEL    Collection Time: 04/01/23  5:45 AM   Result Value Ref Range    Sodium 138 137 - 147 MMOL/L    Potassium 4.1 3.5 - 5.1 MMOL/L    Chloride 106 98 - 110 MMOL/L    Glucose 92 70 - 100 MG/DL    Blood Urea Nitrogen 27 (H) 7 - 25 MG/DL    Creatinine 7.25 (H) 0.4 - 1.00 MG/DL    Calcium 7.4 (L) 8.5 -  10.6 MG/DL    Total Protein 4.9 (L) 6.0 - 8.0 G/DL    Total Bilirubin 0.3 0.3 - 1.2 MG/DL    Albumin 2.7 (L) 3.5 - 5.0 G/DL    Alk Phosphatase 83 25 - 110 U/L    AST (SGOT) 24 7 - 40 U/L    CO2 20 (L) 21 - 30 MMOL/L    ALT (SGPT) 9 7 - 56 U/L    Anion Gap 12 3 - 12    eGFR 22 (L) >60 mL/min     Pertinent radiology reviewed.    Steve Rattler, M.D.  Hematology/Oncology fellow, PGY4.

## 2023-04-01 NOTE — Progress Notes
RT Adult Assessment Note    NAME:Lindsey Brown             MRN: [id number]             DOB:07-Jan-1956          AGE: 67 y.o.  ADMISSION DATE: 03/31/2023             DAYS ADMITTED: LOS: 1 day    Additional Comments:  Impressions of the patient: pt alert and oriented on 3L nasal cannula   Intervention(s)/outcome(s): pt to begin OPEP and PAP,   Patient education that was completed: n/a  Recommendations to the care team: n/a    Vital Signs:  Pulse: 110  RR: 20 PER MINUTE  SpO2: 92 %  O2 Device: Nasal cannula  Liter Flow: 3 Lpm  O2%: 93 %    Breath Sounds:   All Breath Sounds: Coarse crackles  Respiratory Effort:   Respiratory Effort/Pattern: Unlabored  Comments:

## 2023-04-02 LAB — LEGIONELLA ANTIGEN URINE,RAN: ANTIGEN, LEGIONELLA: NEGATIVE

## 2023-04-02 LAB — COMPREHENSIVE METABOLIC PANEL
ALBUMIN: 2.4 g/dL — ABNORMAL LOW (ref <150)
ALK PHOSPHATASE: 80 U/L — ABNORMAL LOW (ref >40)
ALT: 11 U/L — ABNORMAL HIGH (ref 7–56)
ANION GAP: 10 10*3/uL (ref 3–12)
AST: 22 U/L — ABNORMAL HIGH (ref <100)
BLD UREA NITROGEN: 28 mg/dL — ABNORMAL HIGH (ref 7–25)
CALCIUM: 7.5 mg/dL — ABNORMAL LOW (ref 8.5–10.6)
CHLORIDE: 110 MMOL/L (ref 98–110)
CO2: 23 MMOL/L (ref 21–30)
CREATININE: 2.6 mg/dL — ABNORMAL HIGH (ref 0.4–1.00)
EGFR: 20 mL/min — ABNORMAL LOW (ref >60)
GLUCOSE,PANEL: 82 mg/dL — ABNORMAL LOW (ref 70–100)
POTASSIUM: 3.9 MMOL/L (ref 3.5–5.1)
SODIUM: 143 MMOL/L — ABNORMAL LOW (ref 137–147)
TOTAL BILIRUBIN: 0.2 mg/dL — ABNORMAL LOW (ref <200)
TOTAL PROTEIN: 4.7 g/dL — ABNORMAL LOW (ref 6.0–8.0)

## 2023-04-02 LAB — URINALYSIS DIPSTICK REFLEX TO CULTURE
LEUKOCYTES: NEGATIVE
NITRITE: NEGATIVE
URINE ASCORBIC ACID, UA: NEGATIVE
URINE BILE: NEGATIVE

## 2023-04-02 LAB — CBC AND DIFF
ABSOLUTE BASO COUNT: 0 10*3/uL (ref 0–0.20)
HEMOGLOBIN: 6.8 g/dL — ABNORMAL LOW (ref 12.0–15.0)
RBC COUNT: 2.1 M/UL — ABNORMAL LOW (ref 4.0–5.0)
WBC COUNT: 38 10*3/uL — ABNORMAL HIGH (ref 4.5–11.0)

## 2023-04-02 LAB — PTT (APTT): PTT: 28 s (ref 24.0–36.5)

## 2023-04-02 LAB — CREATINE KINASE-CPK: CK TOTAL: 144 U/L (ref 21–215)

## 2023-04-02 LAB — HEPATITIS PANEL, ACUTE

## 2023-04-02 LAB — IMMATURE PLATELET FRACTION: IMMATURE PLATELET FRACT: 2.1 % (ref 1.1–7.1)

## 2023-04-02 LAB — CONFIRMATION BLOOD TYPE

## 2023-04-02 LAB — PROTIME INR (PT)
INR: 1.1 (ref 0.9–1.2)
PROTIME: 11 s (ref 10.2–12.9)

## 2023-04-02 LAB — STREPTOCOCCUS PNEUMO AG, URINE: ANTIGEN, STREP PNEUMO: NEGATIVE

## 2023-04-02 MED ORDER — ALPRAZOLAM 0.25 MG PO TAB
.25 mg | Freq: Two times a day (BID) | ORAL | 0 refills | Status: DC | PRN
Start: 2023-04-02 — End: 2023-04-07
  Administered 2023-04-02 – 2023-04-06 (×3): 0.25 mg via ORAL

## 2023-04-02 MED ORDER — CHOLECALCIFEROL (VITAMIN D3) 25 MCG (1,000 UNIT) PO TAB
1000 [IU] | Freq: Every day | ORAL | 0 refills | Status: DC
Start: 2023-04-02 — End: 2023-04-07
  Administered 2023-04-02 – 2023-04-07 (×6): 1000 [IU] via ORAL

## 2023-04-02 MED ORDER — CEFTRIAXONE INJ 2GM IVP
2 g | INTRAVENOUS | 0 refills | Status: DC
Start: 2023-04-02 — End: 2023-04-06
  Administered 2023-04-02 – 2023-04-06 (×5): 2 g via INTRAVENOUS

## 2023-04-02 MED ORDER — BENZONATATE 100 MG PO CAP
100 mg | Freq: Three times a day (TID) | ORAL | 0 refills | Status: DC | PRN
Start: 2023-04-02 — End: 2023-04-07
  Administered 2023-04-02 – 2023-04-05 (×2): 100 mg via ORAL

## 2023-04-02 NOTE — Progress Notes
General Progress Note    Name: Lindsey Brown        MRN: [id number]          DOB: 06-16-1956            Age: 67 y.o.  Admission Date: 03/31/2023       LOS: 2 days    Date of Service: 04/02/2023    Assessment & Plan      Principal Problem:    Acute hypoxic respiratory failure (HCC)      Lindsey Brown is a 67 y.o.  female with history of CLL, CKD? with   baseline creatinine of 0.7 as of Oct 2023, vitamin B12 deficincy, HTN who presents with shortness of breath and cough and was admitted for acute hypoxic respiratory failure and pneumonia.     #Pneumonia  #Human metapneumovirus   #Concern for superimposed bacterial pneumonia  # acute hypoxic respiratory failure, most likely due to above  # Tachycardia  -At baseline, patient does not use oxygen.  No limitation in activity.  -Patient recently has uncontrolled hypertension.  He was told it is unclear if it is causing kidney damage or if the kidney dysfunction causing the hypertension.  -She was taking lisinopril 10 mg.  Her kidney function on October at primary care physician office was 0.7.  The creatinine increased to 1.2 in February 2023.  It was 1.5 approximately 2 weeks before this hospitalization.  -Her blood pressure recently has not been controlled until amlodipine/olmesartan combination started by primary care physician.  Since then, kidney function got worse as well  -No obstructive symptoms.  -Felt cough with upper respiratory symptoms followed by sputum production of yellowish sputum specially at the time she was admitted.  This improved with antibiotic therapy.  No hemoptysis.  -No known sick contacts.  Has been with family  -Denied any vomiting, diarrhea but her kidney function was getting slightly worse  -She wanted to double check with her hematologist before treatment for CLL since her kidney function was a little bit worse.  - treated with 2 L IV fluids and cefepime in hematology clinic.  Discharged with Levaquin  - RVP positive for human metapneumovirus 4/9  - Chest 2 view 03/31/23:  ill-defined left greater than right perihilar and bibasilar opacities may reflect multifocal pneumonia. Recommend follow-up 2 view chest radiograph in 6-8 weeks to ensure improvement.   -MRSA screen negative  -Procalcitonin slightly up, could be related to acute kidney injury  -Legionella negative.  Chlamydia pneumonia negative.  Unlikely to be mycoplasma    PLAN  > albuterol given wheezing on exam.  Not feeling much improvement with that.  Could be related to volume overload.  > repeat CXR in 6-8 weeks  > given intolerance of cefepime several hours after treatment, transition to ceftriaxone and can stop doxycyline                - discussed with pharmacy given PCN allergy  > sputum culture  > blood culture x 2, no growth to date  -May need to consider repeat point-of-care ultrasound specially if shortness of breath got worse after transfusion.  We may need to consider also IV Lasix for mild acute on chronic diastolic heart failure in the setting of cardiorenal syndrome/acute kidney failure     #CLL  #Leukocytosis  > CBC with diff daily  > Hematology team consulted  -Consented April 11.  Transfusion ordered for 1 unit     #AKI  #CKD - 1-2?  #  Proteinuria  - creatinine at [facility] on 06/30/2022 1.18  - unclear baseline creatinine; per outside chart review creatinine 1.42 (02/05/23)  -She was taking lisinopril 10 mg.  Her kidney function on October at primary care physician office was 0.7.  The creatinine increased to 1.2 in February 2023.  It was 1.5 approximately 2 weeks before this hospitalization.  - unlikely pre-renal in setting of poor oral intake: Most likely renal related to olmesartan  - treated with 2 L IV fluid in oncology clinic  - UA with + protein, wbc 2-10  - cr 1.22 June 2022 careeverywhere. Previously 0.7  PLAN  > hold Olmesartan  > renal ultrasond, no hydronephrosis  -Workup ordered  -May need to switch Norvasc given lower extremity swelling.  -Will consider IV Lasix as above  > If not improved, will consider nephrology consult     #Vitamin B12 deficiency  > continue vitamin B12 supplement     #HTN  > hold PTA olmesartan 20 mg with AKI  > PTA amlodpine 10 mg                                                                         Billing:  Total Time Today was >80 minutes in the following activities: Preparing to see the patient, Obtaining and/or reviewing separately obtained history, Performing a medically appropriate examination and/or evaluation, Counseling and educating the patient/family/caregiver, Ordering medications, tests, or procedures, Documenting clinical information in the electronic or other health record, and Care coordination (not separately reported)    -Obtain more information from family member at bedside, as well as her primary care physician office over the phone.  Request them to fax the information/lab work.  ________________________________________________________________________      Subjective     Lindsey Brown is a 67 y.o. female.      Patient seen at bedside.     No acute issue overnight.  Feeling better.  Less amount of sputum production.  No hemoptysis.  No chest pain.  Not feeling wheezing.  No significant improvement with inhaler therapy.    No vomiting, admits of nausea.  No diarrhea.  Did not pay attention to amount or urine color      Medications     Scheduled Meds:amLODIPine (NORVASC) tablet 10 mg, 10 mg, Oral, QDAY  atorvastatin (LIPITOR) tablet 10 mg, 10 mg, Oral, QDAY  cefTRIAXone (ROCEPHIN) IVP 2 g, 2 g, Intravenous, Q24H*  cyanocobalamin (vitamin B-12) tablet 500 mcg, 500 mcg, Oral, QDAY  doxycycline hyclate (VIBRACIN) tablet 100 mg, 100 mg, Oral, BID  heparin (porcine) PF syringe 5,000 Units, 5,000 Units, Subcutaneous, Q8H    Continuous Infusions:  PRN and Respiratory Meds:albuterol sulfate Q6H PRN, melatonin QHS PRN, ondansetron Q6H PRN, ondansetron (ZOFRAN) IV Q6H PRN, polyethylene glycol 3350 QDAY PRN, sennosides-docusate sodium QDAY PRN        Review of Systems     10-ROS otherwise negative, aside from what is noted above      Objective                            Vital Signs: Last Filed  Vital Signs: 24 Hour Range   BP: 124/60 (04/11 0338)  Temp: 36.9 ?C (98.4 ?F) (04/11 1610)  Pulse: 84 (04/10 2055)  Respirations: 18 PER MINUTE (04/11 0338)  SpO2: 94 % (04/11 0338)  O2 Device: Nasal cannula (04/11 0338)  O2 Liter Flow: 3 Lpm (04/11 0338) BP: (115-147)/(53-78)   Temp:  [36.8 ?C (98.3 ?F)-37.1 ?C (98.7 ?F)]   Pulse:  [52-103]   Respirations:  [18 PER MINUTE]   SpO2:  [92 %-100 %]   O2 Device: Nasal cannula  O2 Liter Flow: 3 Lpm   Intensity Pain Scale (Self Report): 3 (04/01/23 0851) There were no vitals filed for this visit.            Intake/Output (24h)       Intake/Output Summary (Last 24 hours) at 04/02/2023 0711  Last data filed at 04/01/2023 1230  Gross per 24 hour   Intake 480 ml   Output --   Net 480 ml             Physical Exam       GEN: Awake, alert, in no distress  CV: RRR, no murmurs, LE edema is +1, extremities are warm and well perfused  PULM: expiratory wheezes heard, faint rales LLL  ABD: normoactive bowel sounds, non-distended and non-tender  NEURO: alert and answers questions appropriately  PSYCH: pleasant affect, thought process linear, goal-directed thinking, thought content normal, hygiene is good  SKIN: warm and dry to touch, no grossly visible rash      Lab Review     24-hour labs:    Results for orders placed or performed during the hospital encounter of 03/31/23 (from the past 24 hour(s))   RETICULOCYTE COUNT    Collection Time: 04/01/23 12:28 PM   Result Value Ref Range    Retic, Uncorrected 1.8 0.5 - 2.0 %    Retic, Corrected 0.9 %    Retic, Absolute 42.6 30 - 94 K/UL   URINALYSIS DIPSTICK REFLEX TO CULTURE    Collection Time: 04/01/23  9:24 PM    Specimen: Urine   Result Value Ref Range    Color,UA STRAW     Turbidity,UA CLEAR CLEAR-CLEAR    Specific Gravity-Urine 1.008 1.005 - 1.030    pH,UA 6.0 5.0 - 8.0    Protein,UA 3+ (A) NEG-NEG    Glucose,UA NEG NEG-NEG    Ketones,UA NEG NEG-NEG    Bilirubin,UA NEG NEG-NEG    Blood,UA 2+ (A) NEG-NEG    Urobilinogen,UA NORMAL NORM-NORMAL    Nitrite,UA NEG NEG-NEG    Leukocytes,UA NEG NEG-NEG    Urine Ascorbic Acid, UA NEG NEG-NEG   URINALYSIS MICROSCOPIC REFLEX TO CULTURE    Collection Time: 04/01/23  9:24 PM    Specimen: Midstream; Urine   Result Value Ref Range    WBCs,UA 2-10 0 - 2 /HPF    RBCs,UA 2-10 0 - 3 /HPF    Comment,UA       Criteria for reflex to culture are WBC>10, Positive Nitrite, and/or >=+1   leukocytes. If quantity is not sufficient, an addendum will follow.      UA Reflex Specimen Type and Source URINE  MIDSTREAM       Squamous Epithelial Cells 0-2 0 - 5   STREPTOCOCCUS PNEUMO AG, URINE    Collection Time: 04/01/23  9:24 PM   Result Value Ref Range    Antigen, Strep Pneumo NEGATIVE NEGA-NEGATIVE   LEGIONELLA ANTIGEN URINE,RAN    Collection Time: 04/01/23  9:24 PM  Result Value Ref Range    Antigen, Legionella NEGATIVE NEGA-NEGATIVE         POC Review (24h)            Radiology Review     Pertinent radiology reviewed.      Voalte is the preferred method of communication.   Please use the Med Private First Call for all patient-related communications.   Personal Voaltes and pagers are not answered at all hours.

## 2023-04-02 NOTE — Case Management (ED)
Case Management Admission Assessment    NAME:Lindsey Brown                          MRN: [id number]             DOB:08-13-1956          AGE: 67 y.o.  ADMISSION DATE: 03/31/2023             DAYS ADMITTED: LOS: 2 days      Today?s Date: 04/02/2023    Source of Information: Patient       67 y.o. female with CLL who was admitted from hematology clinic for acute hypoxic respiratory failure and pneumonia after presentng with cough, fevers, and shortness of breath. RVP positive for Metapneumovirus.    Plan  Plan: Case Management Assessment, Assist PRN with SW/NCM Services   NCM reviewed EMR.  NCM attended and participated in MPS huddle.  NCM spoke with patient to complete initial assessment. NCM provided contact information and explanation of NCM role. NCM provided opportunity for questions and discussion. Patient/family encouraged to contact Case Management team with questions and concerns during hospitalization and until patient is able to transition back to the patient's primary care physician.  Demographics verified with patient.  Patient lives with spouse in two level home that can accommodate single level living.   Patient's spouse Lindsey Brown plans to provide dc transport once medically stable.   Patient denies a history of the following: HH/HI/SNF/Rehab/LTACH.  Last visit with primary care provider Dr. Barbaraann Brown was two weeks ago.  Discharge planning going. Case management will monitor discharge planning/needs.      Plan  Plan: Case Management Assessment, Assist PRN with SW/NCM Services, Discharge Planning for Home with Post-Acute Care Needs    Patient Address/Phone  [address]  [name] [address]  [phone] (home)     Emergency Contact  Extended Emergency Contact Information  Primary Emergency Contact: Brown,Lindsey  Mobile Phone: [phone]  Relation: Spouse  Preferred language: ENGLISH  Interpreter needed? No  Secondary Emergency Contact: Brown,Lindsey  Home Phone: [phone]  Relation: Daughter    Healthcare Directive  Healthcare Directive: No, patient does not have a healthcare directive  Would patient like to fill out a (a new) Healthcare Directive?: No, patient declined  Psych Advance Directive (Psych unit only): No, patient does not have a Social research officer, government  Does the Patient Need Case Management to Arrange Discharge Transport? (ex: facility, ambulance, wheelchair/stretcher, Medicaid, cab, other): No  Will the Patient Use Family Transport?: Yes  Transportation Name, Phone and Availability #1: spouse Lindsey Brown    Expected Discharge Date  04/05/2023     Living Situation Prior to Admission  Living Arrangements  Type of Residence: Home, independent  Living Arrangements: Spouse/significant other  Financial risk analyst / Tub: Psychologist, counselling  How many levels in the residence?: 2  Can patient live on one level if needed?: Yes  Does residence have entry and/or inside stairs?: Yes (2-3 entry steps)  Assistance needed prior to admit or anticipated on discharge: No  Who provides assistance or could if needed?: spouse  Are they in good health?: Yes  Can support system provide 24/7 care if needed?: Maybe  Level of Function   Prior level of function: Independent  Cognitive Abilities   Cognitive Abilities: Alert and Oriented, Engages in problem solving and planning, Participates in Radio producer Resources  Coverage  Primary Insurance:  Medicare Replacement [facility])  Secondary Insurance: No insurance  Additional Coverage: None  Medication Coverage    Medication Coverage: Medicare Part D  Medicare Part D Plan: [employer]  Have you experienced a noticeable increase in your copay costs recently?: No  Are current medications affordable?: Yes  Do You Use a Co-Pay Card or a Medication Assistance Program to Help Manage Medication Costs?: No  Do You Manage Your Own Medications?: Yes  Source of Income   Source Of Income: Other retirement income  Financial Assistance Needed?  N/A    Psychosocial Needs  Mental Health  Mental Health History: No  Substance Use History  Substance Use History Screen: No  Other  N/A    Current/Previous Services  PCP  Lindsey Brown, [phone], [phone]  Pharmacy    [facility] [address], [place] [address] [place] [address] [place] [address]  [place] [address]  Phone: [phone] Fax: [phone]    Durable Medical Equipment   Durable Medical Equipment at home: None  Home Health  Receiving home health: No  Hemodialysis or Peritoneal Dialysis  Undergoing hemodialysis or peritoneal dialysis: No  Tube/Enteral Feeds  Receive tube/enteral feeds: No  Infusion  Receive infusions: No  Private Duty  Private duty help used: No  Home and Community Based Services  Home and community based services: No  Lindsey Brown  Lindsey Brown: N/A  Hospice  Hospice: No  Outpatient Therapy  PT: No  OT: No  SLP: No  Skilled Nursing Facility/Nursing Home  SNF: No  NH: No  Inpatient Rehab  IPR: No  Long-Term Acute Care Hospital  LTACH: No  Acute Hospital Stay  Acute Hospital Stay: No      Arbutus Leas, RN, BSN  Integrated Nurse Case Manager  Phone ext: [phone]([place])

## 2023-04-02 NOTE — Progress Notes
OCCUPATIONAL THERAPY  NOTE    Patient denies changes from baseline ADL?s and functional mobility and reports no history of balance loss or falls in the last three months.  Patient has not had a procedure or surgery that would make getting dressed difficult, including putting on socks and shoes.  Patient has not demonstrated or reported new difficulties with vision when completing functional tasks.  Patient endorses no concerns with functional skills at home.  Currently, the patient is ambulating without difficulty.    Encouraged patient to continue to perform functional skills/ADL?s while in the hospital and discussed the ability for the patient to complete their ADL?s with the bedside nursing staff.  Occupational and physical therapy services will be discontinued at this time, please re-consult if the patient has a change in functional status.       Lorella Nimrod, OTR/L [phone]

## 2023-04-03 ENCOUNTER — Inpatient Hospital Stay: Admit: 2023-04-03 | Discharge: 2023-04-03 | Payer: MEDICARE

## 2023-04-03 ENCOUNTER — Encounter: Admit: 2023-04-03 | Discharge: 2023-04-03 | Payer: MEDICARE

## 2023-04-03 LAB — CBC AND DIFF
ABSOLUTE BASO COUNT: 0 10*3/uL (ref 0–0.20)
ABSOLUTE EOS COUNT: 0.3 10*3/uL (ref 0–0.45)
ABSOLUTE MONO COUNT: 0.4 10*3/uL (ref 0–0.80)
BASOPHILS %: 0 % (ref 0–2)
EOSINOPHILS %: 1 % (ref 0–5)
MONOCYTES %: 1 % — ABNORMAL LOW (ref 4–12)
MPV: 7.9 FL — ABNORMAL LOW (ref 7–11)
PLATELET COUNT: 151 10*3/uL — ABNORMAL LOW (ref 150–400)
RDW: 14 % — ABNORMAL LOW (ref 11–15)
WBC COUNT: 45 10*3/uL — ABNORMAL HIGH (ref 4.5–11.0)

## 2023-04-03 LAB — COMPREHENSIVE METABOLIC PANEL
ALBUMIN: 2.5 g/dL — ABNORMAL LOW (ref 3.5–5.0)
ALK PHOSPHATASE: 86 U/L — ABNORMAL HIGH (ref 25–110)
ANION GAP: 10 10*3/uL (ref 3–12)
BLD UREA NITROGEN: 29 mg/dL — ABNORMAL HIGH (ref 7–25)
CHLORIDE: 109 MMOL/L — ABNORMAL LOW (ref 98–110)
EGFR: 21 mL/min — ABNORMAL LOW (ref >60)
GLUCOSE,PANEL: 86 mg/dL (ref 70–100)
POTASSIUM: 3.8 MMOL/L — ABNORMAL LOW (ref 3.5–5.1)
SODIUM: 142 MMOL/L — ABNORMAL LOW (ref 137–147)

## 2023-04-03 LAB — LIMITED ECHO
BSA: 1.9 m2
ECHO EF: 65 %
RA PRESSURE: 3

## 2023-04-03 MED ORDER — IRON SUCROSE 300 MG IVPB
300 mg | Freq: Every day | INTRAVENOUS | 0 refills | Status: CP
Start: 2023-04-03 — End: ?
  Administered 2023-04-03 – 2023-04-04 (×4): 300 mg via INTRAVENOUS

## 2023-04-03 MED ORDER — EPINEPHRINE 1 MG/ML (1 ML) IJ SOLN
.3 mg | INTRAMUSCULAR | 0 refills | Status: AC | PRN
Start: 2023-04-03 — End: ?

## 2023-04-03 MED ADMIN — WATER FOR INJECTION, STERILE IJ SOLN [79513]: 20 mL | @ 21:00:00 | Stop: 2023-04-03 | NDC 00409488723

## 2023-04-03 NOTE — Consults
Renal Consult    RHEBA DIAMOND  Admission Date:  03/31/2023            ASSESSMENT/PLAN:   Principal Problem:    Acute hypoxic respiratory failure (HCC)  Active Problems:    CLL (chronic lymphocytic leukemia) (HCC)    AKI (acute kidney injury) (HCC)        Lindsey Brown is a 67 y.o. 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 I have ordered serology  Would wait on Serum immunofixation and ANCA titres, before proceeding with kidney biopsy  Tentatively plan for kidney biopsy on Monday. I will order. Keep NPO.     Aundria Mems, MD  Pager [phone]      History     Reason for Consult: AKI. suspected ATN, related to ACEI/ ARB. appreciate eval for the need for biopsy.     HPI: VALEDA Brown is a 67 y.o. female who was a direct admit from Dr. Neita Garnet clinic for cough, fevers, dyspnea  She follows with Dr. Mikey Bussing for CLL. She has been on observation and the plan recently was to start treatment. She was felt to have a sub acute indication for treatment ie anemia. She was diagnosed with human meta pneumovirus. She is needing oxygen        Medical History:   Diagnosis Date    Arthritis        Surgical History:   Procedure Laterality Date    COLONOSCOPY                 Social History     Socioeconomic History    Marital status: Married   Tobacco Use    Smoking status: Former     Current packs/day: 0.00     Types: Cigarettes     Quit date: 1988     Years since quitting: 36.3    Smokeless tobacco: Never   Vaping Use    Vaping status: Never Used   Substance and Sexual Activity    Alcohol use: Yes    Drug use: Never            Allergies   Pcn [penicillins]      Medications  MEDSamLODIPine, 10 mg, Oral, QDAY  atorvastatin, 10 mg, Oral, QDAY  cefTRIAXone  (ROCEPHIN)  injection (IV or IM), 2 g, Intravenous, Q24H*  CHOLEcalciferoL (vitamin  D3), 1,000 Units, Oral, QDAY  cyanocobalamin (vitamin B-12), 500 mcg, Oral, QDAY  heparin (porcine), 5,000 Units, Subcutaneous, Q8H  iron sucrose (VENOFER) IV, 300 mg, Intravenous, QDAY     IV MEDS  Prn albuterol sulfate Q6H PRN, ALPRAZolam BID PRN 0.25 mg at 04/02/23 1611, benzonatate TID PRN 100 mg at 04/02/23 1850, EPINEPHrine Q5 MIN PRN, melatonin QHS PRN 5 mg at 03/31/23 2155, ondansetron Q6H PRN, ondansetron (ZOFRAN) IV Q6H PRN 4 mg at 03/31/23 2217, polyethylene glycol 3350 QDAY PRN, sennosides-docusate sodium QDAY PRN 1 tablet at 04/01/23 0912     HOME MEDS  Prior to Admission Medications   Prescriptions Last Dose Informant Patient Reported? Taking?   ALPRAZolam (XANAX) 0.25 mg tablet   Yes Yes   Sig: Take one tablet by mouth twice daily as needed for Sleep or Anxiety.   amLODIPine-olmesartan (AZOR) 10-20 mg tablet   Yes Yes   Sig: Take one tablet by mouth daily.   atorvastatin (LIPITOR) 10 mg tablet   Yes Yes   Sig: TAKE 1 TABLET BY MOUTH ONCE DAILY FOR 90 DAYS   cyanocobalamin (vitamin B-12) 500 mcg tablet   Yes Yes   Sig: Take one tablet by mouth daily. ergocalciferol (vitamin D2) (VITAMIN D PO)   Yes Yes   Sig: Take 1 tablet by mouth daily.   ferrous sulfate (FEOSOL) 325 mg (65 mg iron) tablet   Yes Yes   Sig: Take one tablet by mouth daily. Take on an empty stomach at least 1 hour before or 2 hours after food.   levoFLOXacin (LEVAQUIN) 750 mg tablet not started  No No   Sig: Take one tablet by mouth every 48 hours.      Facility-Administered Medications: None          Review of Systems  Constitutional: stable weight,   Eyes: negative  Ears, nose, mouth, throat, and face: negative  Respiratory: dyspnea, cough  Cardiovascular: negative  Gastrointestinal: negative  Genitourinary:negative  Integument/breast: negative  Hematologic/lymphatic: negative  Musculoskeletal:negative  Neurological: negative  Endocrine: negative      Physical Exam     Vital Signs: Last Filed In 24 Hours Vital Signs: 24 Hour Range   BP: 115/58 (04/12 0730)  Temp: 37.2 ?C (99 ?F) (04/12 0730)  Pulse: 79 (04/12 0730)  Respirations: 18 PER MINUTE (04/12 0730)  SpO2: 93 % (04/12 0730)  O2%: 93 % (04/11 2003)  O2 Device: Nasal cannula (04/12 0845)  O2 Liter Flow: 3 Lpm (04/12 0845) BP: (113-133)/(54-72)   Temp:  [36.8 ?C (98.2 ?F)-37.2 ?C (99 ?F)]   Pulse:  [79-96]   Respirations:  [16 PER MINUTE-18 PER MINUTE]   SpO2:  [88 %-97 %]   O2%:  [93 %]   O2 Device: Nasal cannula  O2 Liter Flow: 3 Lpm          Vitals:    04/02/23 0948   Weight: 82.7 kg (182 lb 6.4 oz)       Gen: Alert and Oriented   HEENT: PERRL, Sclera normal   CV: no JVD, S1 and S2 normal, no rubs, murmurs or gallops   Pulm: Clear to Auscultation bilateral   GI: BS+ x4, non-tender to palpation, no organomegaly   Neuro: Grossly normal, moving all extremities, speech intact, alert and oriented x3   Ext: no edema, cyanosis, clubbing  Pulse: 2+ bilaterally         Labs:      Recent Labs     03/31/23  1032 03/31/23  2203 04/01/23  0545 04/02/23  0716 04/03/23  0608   NA 137 136* 138 143 142   K 3.5 4.3 4.1 3.9 3.8   CL 103 107 106 110 109 CO2 25 15* 20* 23 23   GAP 9 14* 12 10 10    BUN 24 28* 27* 28* 29*   CR 2.27* 2.35* 2.39* 2.60* 2.45*   GLU 112* 167* 92 82 86   CA 8.4* 7.3* 7.4* 7.5* 7.6*   ALBUMIN 3.2*  --  2.7* 2.4* 2.5*   MG  --  1.8  --  1.7 1.7   PO4  --  4.6*  --   --  4.8*   HGBA1C  --   --   --  6.0*  --    TSH  --  2.40  --   --   --        Recent Labs     03/31/23  1032 04/01/23  0545 04/02/23  0716 04/02/23  1156 04/03/23  0608   WBC 56.3* 51.1* 38.9*  --  45.5*   HGB 8.6* 7.4* 6.8*  --  7.6*   HCT 27.1* 22.8* 20.6*  --  23.3*   PLTCT 195 164 137*  --  151   PT  --   --   --  11.9  --    INR  --   --   --  1.1  --    PTT  --   --   --  28.3  --    AST 19 24 22   --  21   ALT 12 9 11   --  13   ALKPHOS 85 83 80  --  86      Estimated Creatinine Clearance: 25.8 mL/min (A) (based on SCr of 2.45 mg/dL (H)).  Vitals:    04/02/23 0948   Weight: 82.7 kg (182 lb 6.4 oz)    No results for input(s): PHART, PO2ART in the last 72 hours.    Invalid input(s): PC02A                          Radiology     Pertinent radiology reviewed.    Aundria Mems, MD  Pager [phone]

## 2023-04-03 NOTE — Progress Notes
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.  Chaplain visited to offer prayer, support and sacramental care.   Patient's daughter was present at bedside.  Patient appeared calm and hopeful but concerned about her health and asked for prayer and sacramental care for healing.  Patient shared about her illness and concerns.  Chaplain offered active listening, support, prayer and provided the Sacrament of the Sick for peace, hope and healing.  Patient appreciated for visit.  Continue available for support and prayer.     The spiritual care team is available as needed, 24/7, through the campus switchboard [phone]). For a response within 24 hours, please submit an order in O2 for a chaplain consult.

## 2023-04-03 NOTE — Progress Notes
General Progress Note    Name: Lindsey Brown        MRN: [id number]          DOB: August 16, 1956            Age: 67 y.o.  Admission Date: 03/31/2023       LOS: 3 days    Date of Service: 04/03/2023    Assessment & Plan      Principal Problem:    Acute hypoxic respiratory failure (HCC)  Active Problems:    CLL (chronic lymphocytic leukemia) (HCC)    AKI (acute kidney injury) (HCC)      Lindsey Brown is a 67 y.o.  female with history of CLL, CKD? with   baseline creatinine of 0.7 as of Oct 2023, vitamin B12 deficincy, HTN who presents with shortness of breath and cough and was admitted for acute hypoxic respiratory failure and pneumonia.     #Pneumonia  #Human metapneumovirus   #Concern for superimposed bacterial pneumonia  # acute hypoxic respiratory failure, most likely due to above  # SIRS on admission: improving [tachycardia on admission: resolved. Wbc elevation]  -At baseline, patient does not use oxygen.  No limitation in activity.  -Patient recently has uncontrolled hypertension.  He was told it is unclear if it is causing kidney damage or if the kidney dysfunction causing the hypertension.  -She was taking lisinopril 10 mg.  Her kidney function on October at primary care physician office was 0.7.  The creatinine increased to 1.2 in February 2023.  It was 1.5 approximately 2 weeks before this hospitalization.  -Her blood pressure recently has not been controlled until amlodipine/olmesartan combination started by primary care physician.  Since then, kidney function got worse as well  -No obstructive symptoms.  -Felt cough with upper respiratory symptoms followed by sputum production of yellowish sputum specially at the time she was admitted.  This improved with antibiotic therapy.  No hemoptysis.  -No known sick contacts.  Has been with family  -Denied any vomiting, diarrhea but her kidney function was getting slightly worse  -She wanted to double check with her hematologist before treatment for CLL since her kidney function was a little bit worse.  - treated with 2 L IV fluids and cefepime in hematology clinic.  Discharged with Levaquin  - RVP positive for human metapneumovirus 4/9  - Chest 2 view 03/31/23:  ill-defined left greater than right perihilar and bibasilar opacities may reflect multifocal pneumonia. Recommend follow-up 2 view chest radiograph in 6-8 weeks to ensure improvement.   -MRSA screen negative  -Procalcitonin slightly up, could be related to acute kidney injury  -Legionella negative.  Chlamydia pneumonia negative.  Unlikely to be mycoplasma    PLAN  > albuterol given wheezing on exam.  Not feeling much improvement with that.  Could be related to volume overload.  Overall improving  > repeat CXR in 6-8 weeks  > given possible intolerance of cefepime several hours after treatment, transition to ceftriaxone and stopped doxycyline [atypical bacteria pneumonia workup negative ]               - discussed with pharmacy given PCN allergy  > sputum culture ordered  > blood culture x 2, no growth to date  -repeat point-of-care ultrasound specially if shortness of breath got worse after transfusion.  We may need to consider also IV Lasix for mild acute on chronic diastolic heart failure in the setting of cardiorenal syndrome/acute kidney failure     #CLL  #  Leukocytosis  > CBC with diff daily  > Hematology team consulted  -Consented April 11.  Transfusion ordered for 1 unit  -Discussed with hematology nurse practitioner regarding kappa lambda ratio April 12     #AKI  #CKD - 1-2?  #Proteinuria  - creatinine at [facility] on 06/30/2022 1.18  - unclear baseline creatinine; per outside chart review creatinine 1.42 (02/05/23)  -She was taking lisinopril 10 mg.  Her kidney function on October at primary care physician office was 0.7.  The creatinine increased to 1.2 in February 2023.  It was 1.5 approximately 2 weeks before this hospitalization.  - unlikely pre-renal in setting of poor oral intake: Most likely renal related to olmesartan  - treated with 2 L IV fluid in oncology clinic  - UA with + protein, wbc 2-10  - cr 1.22 June 2022 careeverywhere. Previously 0.7  PLAN  > hold Olmesartan  > renal ultrasond, no hydronephrosis.  Normal kidney size suggest recent injury rather than chronic kidney injury  -Workup ordered  -May need to switch Norvasc given lower extremity swelling.  -Will consider IV Lasix as above  >  nephrology consult  -Follow-up protein electrophoresis  -Iron infusion therapy.  Discussed benefit and risk.  All questions answered     #Vitamin B12 deficiency  > continue vitamin B12 supplement     #HTN  > hold PTA olmesartan 20 mg with AKI  > PTA amlodpine 10 mg                                                                       High complexity case due to acute kidney injury, independently reviewed more than 3 labs, reviewed notes outside my specialty, discussion with hematology, consultation with nephrology  ________________________________________________________________________      Subjective     Lindsey Brown is a 67 y.o. female.      Patient seen at bedside.     No acute issue overnight.  Slow improvement.  Continues to have cough.  No chest pain.  No vomiting.  No change in bowel habits.  She is worried about her white blood cell count  drop      Medications     Scheduled Meds:amLODIPine (NORVASC) tablet 10 mg, 10 mg, Oral, QDAY  atorvastatin (LIPITOR) tablet 10 mg, 10 mg, Oral, QDAY  cefTRIAXone (ROCEPHIN) IVP 2 g, 2 g, Intravenous, Q24H*  CHOLEcalciferoL (vitamin D3) tablet 1,000 Units, 1,000 Units, Oral, QDAY  cyanocobalamin (vitamin B-12) tablet 500 mcg, 500 mcg, Oral, QDAY  heparin (porcine) PF syringe 5,000 Units, 5,000 Units, Subcutaneous, Q8H  iron sucrose (VENOFER) 300 mg in sodium chloride 0.9% (NS) 265 mL IVPB, 300 mg, Intravenous, QDAY    Continuous Infusions:  PRN and Respiratory Meds:albuterol sulfate Q6H PRN, ALPRAZolam BID PRN, benzonatate TID PRN, EPINEPHrine Q5 MIN PRN, melatonin QHS PRN, ondansetron Q6H PRN, ondansetron (ZOFRAN) IV Q6H PRN, polyethylene glycol 3350 QDAY PRN, sennosides-docusate sodium QDAY PRN        Review of Systems     10-ROS otherwise negative, aside from what is noted above      Objective  Vital Signs: Last Filed                 Vital Signs: 24 Hour Range   BP: 124/63 (04/12 1137)  Temp: 36.8 ?C (98.2 ?F) (04/12 1137)  Pulse: 90 (04/12 1137)  Respirations: 18 PER MINUTE (04/12 1137)  SpO2: 94 % (04/12 1137)  O2%: 93 % (04/11 2003)  O2 Device: None (Room air) (04/12 1137)  O2 Liter Flow: 3 Lpm (04/12 1137)  Height: 172.7 cm (5' 7.99) (04/12 1048) BP: (113-133)/(54-79)   Temp:  [36.7 ?C (98 ?F)-37.2 ?C (99 ?F)]   Pulse:  [79-96]   Respirations:  [16 PER MINUTE-18 PER MINUTE]   SpO2:  [88 %-97 %]   O2%:  [93 %]   O2 Device: None (Room air)  O2 Liter Flow: 3 Lpm     Vitals:    04/02/23 0948 04/03/23 1048   Weight: 82.7 kg (182 lb 6.4 oz) 82.7 kg (182 lb 5.1 oz)               Intake/Output (24h)       Intake/Output Summary (Last 24 hours) at 04/03/2023 1203  Last data filed at 04/03/2023 0846  Gross per 24 hour   Intake 762 ml   Output 600 ml   Net 162 ml             Physical Exam       GEN: Awake, alert, in no distress  CV: RRR, no murmurs, LE edema is trace, extremities are warm and well perfused  PULM: expiratory wheezes heard, faint rales LLL  ABD: normoactive bowel sounds, non-distended and non-tender  NEURO: alert and answers questions appropriately  PSYCH: pleasant affect, thought process linear, goal-directed thinking, thought content normal, hygiene is good  SKIN: warm and dry to touch, no grossly visible rash      Lab Review     24-hour labs:    Results for orders placed or performed during the hospital encounter of 03/31/23 (from the past 24 hour(s))   KAPPA/LAMBDA FREE LIGHT CHAINS    Collection Time: 04/02/23  1:48 PM   Result Value Ref Range    Kappa, FLC 10.94 (H) 0.33 - 1.94 MG/DL    Lambda, FLC 0.98 (H) 0.57 - 2.63 MG/DL Kappa/Lambda FLC 1.19 (H) 0.26 - 1.65   ELECTROPHORESIS-SERUM PROTEIN    Collection Time: 04/02/23  1:48 PM   Result Value Ref Range    Total Protein-SEP 4.9 (L) 6.0 - 8.0 G/DL    Albumin %  48 - 68 %    Alpha 1 %  2 - 6 %    Alpha 2 %  5 - 15 %    Beta %,Serum  9 - 17 %    Gamma %  9 - 21 %    Paraprotein  G/DL    Interpretation - SEP      Pathologist Signature     RETICULOCYTE COUNT    Collection Time: 04/02/23  1:48 PM   Result Value Ref Range    Retic, Uncorrected 1.3 0.5 - 2.0 %    Retic, Corrected 0.7 %    Retic, Absolute 32.0 30 - 94 K/UL   FOLATE, SERUM    Collection Time: 04/02/23  1:48 PM   Result Value Ref Range    Serum Folate 11.6 >3.9 NG/ML   HIV 1 & 2 AG-AB SCRN W REFLEX TO HIV CONFIRMATION    Collection Time: 04/02/23  1:48 PM   Result Value Ref  Range    HIV 1 and 2 AG AB Screen NONREACTIVE NR-NONREACTIVE   HEPATITIS PANEL, ACUTE    Collection Time: 04/02/23  1:48 PM   Result Value Ref Range    Hepatitis A IgM NONREACTIVE NR-NONREACTIVE    Anti HBc IgM NONREACTIVE NR-NONREACTIVE    HBsAg NONREACTIVE NR-NONREACTIVE    Anti HCV NONREACTIVE NR-NONREACTIVE   C3 COMPLEMENT 3    Collection Time: 04/02/23  1:48 PM   Result Value Ref Range    Complemnt C3 174.0 88 - 200 MG/DL   C4 COMPLEMENT 4    Collection Time: 04/02/23  1:48 PM   Result Value Ref Range    Complemnt C4 42.0 10 - 49 MG/DL   C REACTIVE PROTEIN (CRP)    Collection Time: 04/02/23  1:48 PM   Result Value Ref Range    C-Reactive Protein 1.35 (H) <1.0 MG/DL   BLOOD TYPE CONFIRMATION - ORDER ONLY IF REQUESTED BY LAB    Collection Time: 04/02/23  1:48 PM   Result Value Ref Range    ABO/RH(D) A POS    CREATINE KINASE-CPK    Collection Time: 04/02/23  1:48 PM   Result Value Ref Range    Creatine Kinase 144 21 - 215 U/L   SODIUM-URINE RANDOM    Collection Time: 04/02/23  1:53 PM   Result Value Ref Range    Sodium, Random 33 MMOL/L   CREATININE-URINE RANDOM    Collection Time: 04/02/23  1:53 PM   Result Value Ref Range    Creatinine, Random 68 MG/DL UREA NITROGEN-URINE RANDOM    Collection Time: 04/02/23  1:53 PM   Result Value Ref Range    Urea Nitrogen 301 MG/DL   PROTEIN/CR RATIO,UR RAN    Collection Time: 04/02/23  1:53 PM   Result Value Ref Range    Protein, Random 502 MG/DL    Creatinine, Random 67 MG/DL    Protein/CR ratio 7.5 (H) <0.15   IMMUNOFIXATION URINE RANDOM    Collection Time: 04/02/23  1:53 PM   Result Value Ref Range    Immuno Fix-URINE      Pathologist Signature     ELECTROPHORESIS-UR RAN    Collection Time: 04/02/23  1:53 PM   Result Value Ref Range    Total Protein, Urine 505 MG/DL    Albumin %, Urine  %    Alpha 1 %, Urine  %    Alpha 2 %, Urine  %    Beta %, Urine  %    Gamma %, Urine  %    UEP Interpretation      Paraprotein, Urine  MG/DL    Pathologist Signature     CBC AND DIFF    Collection Time: 04/03/23  6:08 AM   Result Value Ref Range    White Blood Cells 45.5 (H) 4.5 - 11.0 K/UL    RBC 2.55 (L) 4.0 - 5.0 M/UL    Hemoglobin 7.6 (L) 12.0 - 15.0 GM/DL    Hematocrit 54.0 (L) 36 - 45 %    MCV 91.4 80 - 100 FL    MCH 29.8 26 - 34 PG    MCHC 32.6 32.0 - 36.0 G/DL    RDW 98.1 11 - 15 %    Platelet Count 151 150 - 400 K/UL    MPV 7.9 7 - 11 FL   COMPREHENSIVE METABOLIC PANEL    Collection Time: 04/03/23  6:08 AM   Result Value Ref Range    Sodium 142  137 - 147 MMOL/L    Potassium 3.8 3.5 - 5.1 MMOL/L    Chloride 109 98 - 110 MMOL/L    Glucose 86 70 - 100 MG/DL    Blood Urea Nitrogen 29 (H) 7 - 25 MG/DL    Creatinine 1.61 (H) 0.4 - 1.00 MG/DL    Calcium 7.6 (L) 8.5 - 10.6 MG/DL    Total Protein 4.7 (L) 6.0 - 8.0 G/DL    Total Bilirubin 0.2 (L) 0.3 - 1.2 MG/DL    Albumin 2.5 (L) 3.5 - 5.0 G/DL    Alk Phosphatase 86 25 - 110 U/L    AST (SGOT) 21 7 - 40 U/L    CO2 23 21 - 30 MMOL/L    ALT (SGPT) 13 7 - 56 U/L    Anion Gap 10 3 - 12    eGFR 21 (L) >60 mL/min   MAGNESIUM    Collection Time: 04/03/23  6:08 AM   Result Value Ref Range    Magnesium 1.7 1.6 - 2.6 mg/dL   PARATHYROID HORMONE    Collection Time: 04/03/23  6:08 AM   Result Value Ref Range    PTH Hormone 86.5 (H) 10 - 65 PG/ML   PHOSPHORUS    Collection Time: 04/03/23  6:08 AM   Result Value Ref Range    Phosphorus 4.8 (H) 2.0 - 4.5 MG/DL         POC Review (09U)     Glucose: 86 (04/03/23 0454)      Radiology Review     Pertinent radiology reviewed.      Voalte is the preferred method of communication.   Please use the Med Private First Call for all patient-related communications.   Personal Voaltes and pagers are not answered at all hours.

## 2023-04-03 NOTE — Consults
CLINICAL NUTRITION                                                        Clinical Nutrition Initial Assessment    Name: Lindsey Brown   MRN: [id number]     DOB: 12-Jul-1956      Age: 67 y.o.  Admission Date: 03/31/2023     LOS: 3 days     Date of Service: 04/03/2023    Recommendation:  Continue renal non-HD diet and 2000 mL fluid restriction as ordered  [employer] Renal protein shakes as a meal replacement if pt desires    RD provided diet education on the following:   Low Phos  Carb counting for pre-diabetes,  Protein, phos and K+ recommendations for CKD    Comments:  H/P: Lindsey Brown is a 67 y.o. female with history of CLL, CKD (?) with baseline creatinine of 0.7 as of Oct 2023, vitamin B12 deficincy, HTN who presents with shortness of breath and cough and was admitted for acute hypoxic respiratory failure and pneumonia.  RD consulted for diet education.     Met with pt and daughter in room. Pt with elevated HgbA1C (prediabetic), elevated phos, elevated creatine. Pt reports PTA was eating well. She typically eats TID. Breakfast is generally 2 poached eggs and toast or Total cereal with almond milk and a banana.  Lunch is typically a sandwich and grapes.  Dinner is ?Every Plate? meals that are typically pasta/rice/potato based.  She drinks 1 Coke per day, sometimes snacks on popcorn, does not eat a lot of sweets.      Pt reports her A1C was elevated in the past but she was able to lower with dietary changes. Her son is a type ! diabetic, so she is familiar with carb counting.      RD reviewed phosphorous containing foods and how to limit to 800 mg per day. While her K+ appears to be wnl, REC keep K+ intake to about 3000 mg per day (reviewed K+ containing foods).  We discussed limiting protein to no more than 60 g/day for AKI and how to gauge protein content of foods.  We also reviewed carb counting basics and Plate Method of Eating.  RD provided and reviewed 5 handouts.  Pt communicated understanding, will reach out with additional questions.     This admission, good appetite (eating 100% meals) but she is unhappy about 1500 mL FR as she is thirsty. Provider increased to 2000 mL. RD provided [employer] Renal shakes for pt?s room for her to try as meal replacement option should she desire. No significant weight loss, good appetite.  She is not at acute risk but RD will monitor. Note she is on Venclexta (inhibitor). Side effects include diarrhea, fatigue, nausea, anemia.    Nutrition Assessment of Patient:  Admit Weight: 82.7 kg (Bedscale); Usual Weight: 79.4 kg (175-180# per EMR); Desired Weight: 74.2 kg  BMI (Calculated): 27.73; BMI Categories Adult: Over Weight: 25-29.9;      Pertinent Allergies/Intolerances: NKFA    Pertinent Labs: BUN 29, Crea 2.45, Phos 4.8, A1C 6.0, GFR 21; Pertinent Meds: ABX, Vit D, B12, Venofer, Zofran, prn laxatives;      Oral Diet Order: Renal- non dialysis patient;1500 mL/day fluid restriction;         Current Oral Intake: Adequate  Estimated Calorie Needs: 1855-2225 (25-30 kcal/kg using DBW 74.2 kg)  Estimated Protein Needs: 59 (1.0 g/kg using DBW 74.2 kg-pt with AKI)    Malnutrition Assessment:   Does not meet criteria                           Nutrition Focused Physical Assessment:   Loss of Subcutaneous Fat: No;  ;    Muscle Wasting: No;  ;    Edema: Yes; Severity: Mild; Location: Left, Lower extremities, Right     Pressure Injury: None noted     Comment: Last BM 4/11    Nutrition Diagnosis:  Altered nutrition-related laboratory values, specify: (Elevated phos)  Etiology: AKI  Signs & Symptoms: EMR                      Intervention / Plan:  Renal diet education  Renal non HD diet, ONS if pt skips a meal  Monitor po intake, meds, labs, wt trends as needed    Goals:  Verbalize understanding of diet  Time Frame: Prior to discharge  Status: Met                Clinical Dietitian: Ottie Glazier, RD, LD  Available on [facility]

## 2023-04-04 LAB — CBC AND DIFF
ABSOLUTE BASO COUNT: 0 10*3/uL — ABNORMAL HIGH (ref 0–0.20)
ABSOLUTE EOS COUNT: 0.1 10*3/uL — ABNORMAL HIGH (ref 0–0.45)
ABSOLUTE MONO COUNT: 0.8 10*3/uL — ABNORMAL HIGH (ref 0–0.80)
PLATELET COUNT: 153 10*3/uL — ABNORMAL LOW (ref 150–400)
RDW: 14 % — ABNORMAL LOW (ref 11–15)

## 2023-04-04 LAB — COMPREHENSIVE METABOLIC PANEL
ALBUMIN: 2.6 g/dL — ABNORMAL LOW (ref 3.5–5.0)
ALK PHOSPHATASE: 97 U/L — ABNORMAL HIGH (ref 25–110)
ALT: 13 U/L — ABNORMAL LOW (ref 7–56)
ANION GAP: 11 10*3/uL (ref 3–12)
AST: 23 U/L — ABNORMAL LOW (ref 7–40)
BLD UREA NITROGEN: 30 mg/dL — ABNORMAL HIGH (ref 7–25)
CHLORIDE: 110 MMOL/L — ABNORMAL LOW (ref 98–110)
CO2: 21 MMOL/L — ABNORMAL LOW (ref 21–30)
CREATININE: 2.1 mg/dL — ABNORMAL HIGH (ref 0.4–1.00)
EGFR: 25 mL/min — ABNORMAL LOW (ref >60)
GLUCOSE,PANEL: 81 mg/dL (ref 70–100)
POTASSIUM: 3.8 MMOL/L — ABNORMAL LOW (ref 3.5–5.1)
TOTAL BILIRUBIN: 0.3 mg/dL (ref 0.3–1.2)

## 2023-04-04 LAB — ZINC: ZINC: 52 mg/dL — ABNORMAL LOW (ref 0.4–1.00)

## 2023-04-04 LAB — MPO/PR3 W REFLEX TO ANCA

## 2023-04-04 NOTE — Progress Notes
General Progress Note    Name: Lindsey Brown        MRN: [id number]          DOB: Jul 17, 1956            Age: 67 y.o.  Admission Date: 03/31/2023       LOS: 4 days    Date of Service: 04/04/2023    Assessment & Plan      Principal Problem:    Acute hypoxic respiratory failure (HCC)  Active Problems:    CLL (chronic lymphocytic leukemia) (HCC)    AKI (acute kidney injury) (HCC)      SIDDHI DORNBUSH is a 67 y.o.  female with history of CLL, CKD? with   baseline creatinine of 0.7 as of Oct 2023, vitamin B12 deficincy, HTN who presents with shortness of breath and cough and was admitted for acute hypoxic respiratory failure and pneumonia.     #Pneumonia  #Human metapneumovirus   #Concern for superimposed bacterial pneumonia  # acute hypoxic respiratory failure, most likely due to above  # SIRS on admission: improving [tachycardia on admission: resolved. Wbc elevation]  -At baseline, patient does not use oxygen.  No limitation in activity.  -Patient recently has uncontrolled hypertension.  He was told it is unclear if it is causing kidney damage or if the kidney dysfunction causing the hypertension.  -She was taking lisinopril 10 mg.  Her kidney function on October at primary care physician office was 0.7.  The creatinine increased to 1.2 in February 2023.  It was 1.5 approximately 2 weeks before this hospitalization.  -Her blood pressure recently has not been controlled until amlodipine/olmesartan combination started by primary care physician.  Since then, kidney function got worse as well  -No obstructive symptoms.  -Felt cough with upper respiratory symptoms followed by sputum production of yellowish sputum specially at the time she was admitted.  This improved with antibiotic therapy.  No hemoptysis.  -No known sick contacts.  Has been with family  -Denied any vomiting, diarrhea but her kidney function was getting slightly worse  -She wanted to double check with her hematologist before treatment for CLL since her kidney function was a little bit worse.  - treated with 2 L IV fluids and cefepime in hematology clinic.  Discharged with Levaquin  - RVP positive for human metapneumovirus 4/9  - Chest 2 view 03/31/23:  ill-defined left greater than right perihilar and bibasilar opacities may reflect multifocal pneumonia. Recommend follow-up 2 view chest radiograph in 6-8 weeks to ensure improvement.   -MRSA screen negative  -Procalcitonin slightly up, could be related to acute kidney injury  -Legionella negative.  Chlamydia pneumonia negative.  Unlikely to be mycoplasma  PLAN  > Albuterol given wheezing on exam.    > Repeat CXR in 6-8 weeks  > Cont ceftriaxone and stopped doxycyline [atypical bacteria pneumonia workup negative ]               - discussed with pharmacy given PCN allergy  > blood culture x 2, no growth to date  > Wean off O2 as able. She was not RA this morning but SpO2 was in the low 90s.     #CLL  #Leukocytosis  > CBC with diff daily and transfuse for Hgb<7  > Hematology team consulted     #AKI  #CKD - 1-2?  #Proteinuria  - creatinine at [facility] on 06/30/2022 1.18  - unclear baseline creatinine; per outside chart review creatinine 1.42 (02/05/23)  -  She was taking lisinopril 10 mg.  Her kidney function on October at primary care physician office was 0.7.  The creatinine increased to 1.2 in February 2023.  It was 1.5 approximately 2 weeks before this hospitalization.  - unlikely pre-renal in setting of poor oral intake: Most likely renal related to olmesartan  - treated with 2 L IV fluid in oncology clinic  - UA with + protein, wbc 2-10  - cr 1.22 June 2022 careeverywhere. Previously 0.7  - renal ultrasond, no hydronephrosis.  Normal kidney size suggest recent injury rather than chronic kidney injury  - Nephrology consulted.  PLAN  > Cont to hold Olmesartan  > Iron infusion therapy.  Discussed benefit and risk.  All questions answered  > Plan for renal bx on Monday.  > Autoimmune labs pending for vasculitis. #Vitamin B12 deficiency  > continue vitamin B12 supplement     #HTN  - Echo was normal.   > hold PTA olmesartan 20 mg with AKI  > PTA amlodpine 10 mg                                                                       High complexity case due to acute kidney injury, independently reviewed more than 3 labs, reviewed notes outside my specialty, discussion with hematology, consultation with nephrology    Melvyn Novas, MD FACP  Clinical Assistant Professor - Internal Medicine Dept  [employer] P- [phone]  ________________________________________________________________________      Subjective     Lindsey Brown is a 67 y.o. female.  Patient was seen and examined. No acute events overnight. She denies any complains. She is concerned about the need for renal biopsy. She believes her renal dysfunction is likely due to Olmesartan or her poor HTN control.      Medications     Scheduled Meds:amLODIPine (NORVASC) tablet 10 mg, 10 mg, Oral, QDAY  atorvastatin (LIPITOR) tablet 10 mg, 10 mg, Oral, QDAY  cefTRIAXone (ROCEPHIN) IVP 2 g, 2 g, Intravenous, Q24H*  CHOLEcalciferoL (vitamin D3) tablet 1,000 Units, 1,000 Units, Oral, QDAY  cyanocobalamin (vitamin B-12) tablet 500 mcg, 500 mcg, Oral, QDAY  heparin (porcine) PF syringe 5,000 Units, 5,000 Units, Subcutaneous, Q8H  iron sucrose (VENOFER) 300 mg in sodium chloride 0.9% (NS) 265 mL IVPB, 300 mg, Intravenous, QDAY    Continuous Infusions:  PRN and Respiratory Meds:albuterol sulfate Q6H PRN, ALPRAZolam BID PRN, benzonatate TID PRN, EPINEPHrine Q5 MIN PRN, melatonin QHS PRN, ondansetron Q6H PRN, ondansetron (ZOFRAN) IV Q6H PRN, polyethylene glycol 3350 QDAY PRN, sennosides-docusate sodium QDAY PRN        Review of Systems     10-ROS otherwise negative, aside from what is noted above      Objective                            Vital Signs: Last Filed                 Vital Signs: 24 Hour Range   BP: 145/98 (04/13 0600)  Temp: 36.6 ?C (97.9 ?F) (04/13 0600)  Pulse: 90 (04/13 0600)  Respirations: 16 PER MINUTE (04/13 0600)  SpO2: 96 % (04/13 0600)  O2%: 94 % (  04/13 0130)  O2 Device: None (Room air) (04/13 0600)  O2 Liter Flow: 1.5 Lpm (04/13 0130)  Height: 172.7 cm (5' 7.99) (04/12 1048) BP: (116-145)/(57-98)   Temp:  [36.6 ?C (97.9 ?F)-37 ?C (98.6 ?F)]   Pulse:  [85-97]   Respirations:  [16 PER MINUTE-18 PER MINUTE]   SpO2:  [90 %-96 %]   O2%:  [94 %]   O2 Device: None (Room air)  O2 Liter Flow: 1.5 Lpm     Vitals:    04/02/23 0948 04/03/23 1048   Weight: 82.7 kg (182 lb 6.4 oz) 82.7 kg (182 lb 5.1 oz)               Intake/Output (24h)       Intake/Output Summary (Last 24 hours) at 04/04/2023 1610  Last data filed at 04/04/2023 0100  Gross per 24 hour   Intake 480 ml   Output 250 ml   Net 230 ml             Physical Exam       General:  Alert, cooperative, no distress  Head:  Normocephalic, atraumatic  Neck:  Supple, symmetrical, trachea midline, no adenopathy  Lungs:  Clear to auscultation bilaterally, no wheezing, no crackles no rales.  Heart:    Regular rate and rhythm, S1, S2 normal, no murmur, click rub or gallop  Abdomen:  Soft, non-tender.  Bowel sounds normal.  No masses.  No organomegaly.  Extremities:  No cyanosis or edema  Pulses:   2+ and symmetric, all extremities  Neurologic:  A&O x3, No focal deficits.        Lab Review     24-hour labs:    Results for orders placed or performed during the hospital encounter of 03/31/23 (from the past 24 hour(s))   CBC AND DIFF    Collection Time: 04/04/23  6:28 AM   Result Value Ref Range    White Blood Cells 43.6 (H) 4.5 - 11.0 K/UL    RBC 2.68 (L) 4.0 - 5.0 M/UL    Hemoglobin 8.1 (L) 12.0 - 15.0 GM/DL    Hematocrit 96.0 (L) 36 - 45 %    MCV 92.8 80 - 100 FL    MCH 30.4 26 - 34 PG    MCHC 32.8 32.0 - 36.0 G/DL    RDW 45.4 11 - 15 %    Platelet Count 153 150 - 400 K/UL    MPV 7.7 7 - 11 FL   COMPREHENSIVE METABOLIC PANEL    Collection Time: 04/04/23  6:28 AM   Result Value Ref Range    Sodium 142 137 - 147 MMOL/L    Potassium 3.8 3.5 - 5.1 MMOL/L    Chloride 110 98 - 110 MMOL/L    Glucose 81 70 - 100 MG/DL    Blood Urea Nitrogen 30 (H) 7 - 25 MG/DL    Creatinine 0.98 (H) 0.4 - 1.00 MG/DL    Calcium 7.8 (L) 8.5 - 10.6 MG/DL    Total Protein 4.8 (L) 6.0 - 8.0 G/DL    Total Bilirubin 0.3 0.3 - 1.2 MG/DL    Albumin 2.6 (L) 3.5 - 5.0 G/DL    Alk Phosphatase 97 25 - 110 U/L    AST (SGOT) 23 7 - 40 U/L    CO2 21 21 - 30 MMOL/L    ALT (SGPT) 13 7 - 56 U/L    Anion Gap 11 3 - 12    eGFR 25 (L) >60 mL/min  MAGNESIUM    Collection Time: 04/04/23  6:28 AM   Result Value Ref Range    Magnesium 1.7 1.6 - 2.6 mg/dL         POC Review (57Q)     Glucose: 81 (04/04/23 4696)      Radiology Review     Pertinent radiology reviewed.      Melvyn Novas, MD FACP  [employer] [phone]

## 2023-04-04 NOTE — Progress Notes
RT Adult Assessment Note    NAME:Lindsey Brown             MRN: [id number]             DOB:[birth year]          AGE: 67 y.o.  ADMISSION DATE: 03/31/2023             DAYS ADMITTED: LOS: 4 days    Additional Comments:  Impressions of the patient: NAD on RA  Intervention(s)/outcome(s): RT re evaluation, PAP, and OPEP therapy completed  Patient education that was completed: n/a  Recommendations to the care team: n/a    Vital Signs:  Pulse:  89  RR:  16 PER MINUTE  SpO2:  95%  O2 Device:  None (Room air)  Liter Flow:    O2%:      Breath Sounds: Clear (Implies normal); Decreased      Respiratory Effort: Unlabored     Comments:

## 2023-04-05 LAB — CBC AND DIFF
HEMATOCRIT: 24 % — ABNORMAL LOW (ref 36–45)
HEMOGLOBIN: 7.8 g/dL — ABNORMAL LOW (ref 12.0–15.0)
RBC COUNT: 2.6 M/UL — ABNORMAL LOW (ref 4.0–5.0)
WBC COUNT: 45 10*3/uL — ABNORMAL HIGH (ref 4.5–11.0)

## 2023-04-05 LAB — COMPREHENSIVE METABOLIC PANEL: CHLORIDE: 110 MMOL/L (ref 98–110)

## 2023-04-05 MED ORDER — MAGNESIUM OXIDE 400 MG (241.3 MG MAGNESIUM) PO TAB
400 mg | Freq: Once | ORAL | 0 refills | Status: CP
Start: 2023-04-05 — End: ?
  Administered 2023-04-05: 18:00:00 400 mg via ORAL

## 2023-04-05 MED ORDER — POTASSIUM CHLORIDE 20 MEQ PO TBTQ
40 meq | Freq: Once | ORAL | 0 refills | Status: CP
Start: 2023-04-05 — End: ?
  Administered 2023-04-05: 18:00:00 40 meq via ORAL

## 2023-04-05 NOTE — Consults
Interventional Radiology Consult Note      Admission Date: 03/31/2023  LOS: 5 days                       Principal Problem:    Acute hypoxic respiratory failure (HCC)  Active Problems:    CLL (chronic lymphocytic leukemia) (HCC)    AKI (acute kidney injury) (HCC)      Reason for consult: AKI    Assessment:   - Admitted with acute hypoxic respiratory failure, AKI  - Review of imaging significant for no contraindications for native renal biopsy  -Creatinine 2.04, GFR 27, platelets 169, hemoglobin 7.8 (goal than 8.5 prior to renal biopsy)  - Labs, medications, and allergies meet procedural protocol.   - Pt is not on a therapeutic blood thinner  -   Platelet Count   Date Value Ref Range Status   04/05/2023 169 150 - 400 K/UL Final   ;   INR   Date Value Ref Range Status   04/02/2023 1.1 0.9 - 1.2 Final       Plan:  -Native renal biopsy, which has been reviewed and added onto the [facility] schedule for 04/06/2023.   - Procedural risks include: Possible damage to structures (skin, blood vessels, organs, and nerves) through which or near where needles and biopsy retrieval devices are deployed. Pain. Organ specific risks may include. Possible inadequate specimen retrieval. Organ specific risk include severe bleeding and or kidney injury.   - For sedation purposes, please keep NPO prior to procedure. May take PO medications with sips of water.    We appreciate being able to participate in this patient's care. Please page with any questions or concerns.    Philmore Pali, APRN-NP   [phone]  [facility]    IR Team Pager [phone] (After-hours and Weekends)  __________________________________________________________________      Procedure: Native renal biopsy    IR Notes: Risks and benefits discussed with patient at bedside, discussed with internal medicine goal hemoglobin greater than 8.5  __________________________________________________________________    Chief Complaint: AKI    Previous Anesthetic/Sedation History:  Reviewed.    Code Status: Full Code    History of present illness:  Lindsey Brown is a 67 y.o. female patient with hx of AKI, CLL, acute hypoxic respiratory failure IR consulted for renal biopsy. See ROS below for current symptoms    Review of Systems  Constitutional: negative  Ears, nose, mouth, throat, and face: negative  Respiratory: negative  Cardiovascular: negative  Gastrointestinal: negative  Musculoskeletal:negative  Neurological: negative  Behavioral/Psych: negative     Medications  Scheduled Meds:amLODIPine (NORVASC) tablet 10 mg, 10 mg, Oral, QDAY  atorvastatin (LIPITOR) tablet 10 mg, 10 mg, Oral, QDAY  cefTRIAXone (ROCEPHIN) IVP 2 g, 2 g, Intravenous, Q24H*  CHOLEcalciferoL (vitamin D3) tablet 1,000 Units, 1,000 Units, Oral, QDAY  cyanocobalamin (vitamin B-12) tablet 500 mcg, 500 mcg, Oral, QDAY  heparin (porcine) PF syringe 5,000 Units, 5,000 Units, Subcutaneous, Q8H  magnesium oxide (MAGOX) tablet 400 mg, 400 mg, Oral, ONCE  potassium chloride SR (K-DUR) tablet 40 mEq, 40 mEq, Oral, ONCE    Continuous Infusions:  PRN and Respiratory Meds:albuterol sulfate Q6H PRN, ALPRAZolam BID PRN, benzonatate TID PRN, EPINEPHrine Q5 MIN PRN, melatonin QHS PRN, ondansetron Q6H PRN, ondansetron (ZOFRAN) IV Q6H PRN, polyethylene glycol 3350 QDAY PRN, sennosides-docusate sodium QDAY PRN      Objective  Vital Signs: Last Filed                 Vital Signs: 24 Hour Range   BP: 111/69 (04/14 0838)  Temp: 36.8 ?C (98.2 ?F) (04/14 1610)  Pulse: 87 (04/14 0838)  Respirations: 16 PER MINUTE (04/14 0838)  SpO2: 93 % (04/14 0838)  O2 Device: None (Room air) (04/14 0838) BP: (111-131)/(63-69)   Temp:  [36.7 ?C (98 ?F)-36.9 ?C (98.5 ?F)]   Pulse:  [87-105]   Respirations:  [16 PER MINUTE-18 PER MINUTE]   SpO2:  [93 %-97 %]   O2 Device: None (Room air)     Vitals:    04/02/23 0948 04/03/23 1048   Weight: 82.7 kg (182 lb 6.4 oz) 82.7 kg (182 lb 5.1 oz)         Intake/Output Summary:  (Last 24 hours)    Intake/Output Summary (Last 24 hours) at 04/05/2023 1224  Last data filed at 04/05/2023 0326  Gross per 24 hour   Intake 100 ml   Output 500 ml   Net -400 ml           Physical Exam  General appearance: alert and no distress  Neurologic: Grossly normal, at baseline  Lungs: Nonlabored with normal effort  Abdomen: soft, non-tender.   Extremities: extremities normal,     Airway:  airway assessment performed  Mallampati II (soft palate, uvula, fauces visible)   Anesthesia Classification:  ASA III (A patient with a severe systemic disease that limits activity, but is not incapacitating)  Pre procedure anxiolysis plan: Midazolam  Intra-procedural Sedation/Medication Plan: Fentanyl, Lidocaine, and Midazolam  Personal history of sedation complications: Denies adverse event.   Family history of sedation complications: Denies adverse event.   Medications for Reversal: Naloxone and Flumazenil  Discussion/Reviews:  Physician has discussed risks and alternatives of this type of sedation and above planned procedures with patient  NPO Status: Acceptable   Pregnancy Status: Not Pregnant    Lab/Radiology/Other Diagnostic Tests:  Labs:  Hematology:    Lab Results   Component Value Date    HGB 7.8 04/05/2023    HCT 24.5 04/05/2023    PLTCT 169 04/05/2023    WBC 45.3 04/05/2023    NEUT 5 04/04/2023    ANC 2.26 04/05/2023    ANC 2.09 04/04/2023    LYMPH 90 04/05/2023    ALC 40.38 04/04/2023    MONA 2 04/04/2023    AMC 0.82 04/04/2023    EOSA 0 04/04/2023    ABC 0.09 04/04/2023    MCV 92.5 04/05/2023    MCH 29.5 04/05/2023    MCHC 32.0 04/05/2023    MPV 7.9 04/05/2023    RDW 14.4 04/05/2023   , Coagulation:    Lab Results   Component Value Date    PT 11.9 04/02/2023    PTT 28.3 04/02/2023    INR 1.1 04/02/2023   , and General Chemistry:    Lab Results   Component Value Date    NA 143 04/05/2023    K 3.7 04/05/2023    CL 110 04/05/2023    CO2 22 04/05/2023    GAP 11 04/05/2023    BUN 30 04/05/2023    CR 2.04 04/05/2023    GLU 84 04/05/2023    CA 7.8 04/05/2023 ALBUMIN 2.6 04/05/2023    MG 1.7 04/05/2023    TOTBILI 0.3 04/05/2023    PO4 4.8 04/03/2023     Radiology: Reviewed.

## 2023-04-05 NOTE — Progress Notes
General Progress Note    Name: Lindsey Brown        MRN: [id number]          DOB: [birth year]            Age: 67 y.o.  Admission Date: 03/31/2023       LOS: 5 days    Date of Service: 04/05/2023    Assessment & Plan      Principal Problem:    Acute hypoxic respiratory failure (HCC)  Active Problems:    CLL (chronic lymphocytic leukemia) (HCC)    AKI (acute kidney injury) (HCC)      Lindsey Brown is a 67 y.o.  female with history of CLL, CKD? with   baseline creatinine of 0.7 as of Oct 2023, vitamin B12 deficincy, HTN who presents with shortness of breath and cough and was admitted for acute hypoxic respiratory failure and pneumonia.     #Pneumonia  #Human metapneumovirus   #Concern for superimposed bacterial pneumonia  # acute hypoxic respiratory failure, most likely due to above  # SIRS on admission: improving [tachycardia on admission: resolved. Wbc elevation]  -At baseline, patient does not use oxygen.  No limitation in activity.  -Patient recently has uncontrolled hypertension.  He was told it is unclear if it is causing kidney damage or if the kidney dysfunction causing the hypertension.  -She was taking lisinopril 10 mg.  Her kidney function on October at primary care physician office was 0.7.  The creatinine increased to 1.2 in February 2023.  It was 1.5 approximately 2 weeks before this hospitalization.  -Her blood pressure recently has not been controlled until amlodipine/olmesartan combination started by primary care physician.  Since then, kidney function got worse as well  -No obstructive symptoms.  -Felt cough with upper respiratory symptoms followed by sputum production of yellowish sputum specially at the time she was admitted.  This improved with antibiotic therapy.  No hemoptysis.  -No known sick contacts.  Has been with family  -Denied any vomiting, diarrhea but her kidney function was getting slightly worse  -She wanted to double check with her hematologist before treatment for CLL since her kidney function was a little bit worse.  - treated with 2 L IV fluids and cefepime in hematology clinic.  Discharged with Levaquin  - RVP positive for human metapneumovirus 4/9  - Chest 2 view 03/31/23:  ill-defined left greater than right perihilar and bibasilar opacities may reflect multifocal pneumonia. Recommend follow-up 2 view chest radiograph in 6-8 weeks to ensure improvement.   -MRSA screen negative  -Procalcitonin slightly up, could be related to acute kidney injury  -Legionella negative.  Chlamydia pneumonia negative.  Unlikely to be mycoplasma  PLAN  > Albuterol given wheezing on exam.    > Repeat CXR in 6-8 weeks  > Cont ceftriaxone and stopped doxycyline [atypical bacteria pneumonia workup negative ]               - discussed with pharmacy given PCN allergy  > blood culture x 2, no growth to date  > Wean off O2 as able. She was on RA this morning     #CLL  #Leukocytosis  > CBC with diff daily and transfuse for Hgb<7  > Hematology team consulted     #AKI  #CKD - 1-2?  #Proteinuria  - creatinine at [facility] on 06/30/2022 1.18  - unclear baseline creatinine; per outside chart review creatinine 1.42 (02/05/23)  -She was taking lisinopril 10 mg.  Her kidney function on October at primary care physician office was 0.7.  The creatinine increased to 1.2 in February 2023.  It was 1.5 approximately 2 weeks before this hospitalization.  - unlikely pre-renal in setting of poor oral intake: Most likely renal related to olmesartan  - treated with 2 L IV fluid in oncology clinic  - UA with + protein, wbc 2-10  - cr 1.22 June 2022 careeverywhere. Previously 0.7  - renal ultrasond, no hydronephrosis.  Normal kidney size suggest recent injury rather than chronic kidney injury  - Nephrology consulted.  PLAN  > Cont to hold Olmesartan  > Iron infusion therapy.  Discussed benefit and risk.  All questions answered  > Plan for renal bx on Monday. Per IR, they want her hgb >8.5 so will give a unit of pRBC today.     #Vitamin B12 deficiency  > continue vitamin B12 supplement     #HTN  - Echo was normal.   > hold PTA olmesartan 20 mg with AKI  > PTA amlodpine 10 mg                                                                       High complexity case due to acute kidney injury, independently reviewed more than 3 labs, reviewed notes outside my specialty, discussion with hematology, consultation with nephrology    Melvyn Novas, MD FACP  Clinical Assistant Professor - Internal Medicine Dept  [employer] P- [phone]  ________________________________________________________________________      Subjective     Lindsey Brown is a 67 y.o. female.  Patient was seen and examined. No acute events overnight.  No new complaints.  Breathing stable.  She is tolerating oral intake well.  Still has loose stools with antibiotics.  Reports only 1 bowel movement today.      Medications     Scheduled Meds:amLODIPine (NORVASC) tablet 10 mg, 10 mg, Oral, QDAY  atorvastatin (LIPITOR) tablet 10 mg, 10 mg, Oral, QDAY  cefTRIAXone (ROCEPHIN) IVP 2 g, 2 g, Intravenous, Q24H*  CHOLEcalciferoL (vitamin D3) tablet 1,000 Units, 1,000 Units, Oral, QDAY  cyanocobalamin (vitamin B-12) tablet 500 mcg, 500 mcg, Oral, QDAY  heparin (porcine) PF syringe 5,000 Units, 5,000 Units, Subcutaneous, Q8H    Continuous Infusions:  PRN and Respiratory Meds:albuterol sulfate Q6H PRN, ALPRAZolam BID PRN, benzonatate TID PRN, EPINEPHrine Q5 MIN PRN, melatonin QHS PRN, ondansetron Q6H PRN, ondansetron (ZOFRAN) IV Q6H PRN, polyethylene glycol 3350 QDAY PRN, sennosides-docusate sodium QDAY PRN        Review of Systems     10-ROS otherwise negative, aside from what is noted above      Objective                            Vital Signs: Last Filed                 Vital Signs: 24 Hour Range   BP: 111/69 (04/14 0838)  Temp: 36.8 ?C (98.2 ?F) (04/14 9604)  Pulse: 87 (04/14 0838)  Respirations: 16 PER MINUTE (04/14 0838)  SpO2: 93 % (04/14 0838)  O2 Device: None (Room air) (04/14 0838) BP: (111-131)/(63-69)   Temp:  [36.7 ?C (98 ?  F)-36.9 ?C (98.5 ?F)]   Pulse:  [87-105]   Respirations:  [16 PER MINUTE-18 PER MINUTE]   SpO2:  [92 %-97 %]   O2 Device: None (Room air)     Vitals:    04/02/23 0948 04/03/23 1048   Weight: 82.7 kg (182 lb 6.4 oz) 82.7 kg (182 lb 5.1 oz)               Intake/Output (24h)       Intake/Output Summary (Last 24 hours) at 04/05/2023 1610  Last data filed at 04/05/2023 0326  Gross per 24 hour   Intake 150 ml   Output 1300 ml   Net -1150 ml             Physical Exam       General:  Alert, cooperative, no distress  Head:  Normocephalic, atraumatic  Neck:  Supple, symmetrical, trachea midline, no adenopathy  Lungs:  Clear to auscultation bilaterally, no wheezing, no crackles no rales.  Heart:    Regular rate and rhythm, S1, S2 normal, no murmur, click rub or gallop  Abdomen:  Soft, non-tender.  Bowel sounds normal.  No masses.  No organomegaly.  Extremities:  No cyanosis or edema  Pulses:   2+ and symmetric, all extremities  Neurologic:  A&O x3, No focal deficits.        Lab Review     24-hour labs:    Results for orders placed or performed during the hospital encounter of 03/31/23 (from the past 24 hour(s))   CBC AND DIFF    Collection Time: 04/05/23  7:45 AM   Result Value Ref Range    White Blood Cells 45.3 (H) 4.5 - 11.0 K/UL    RBC 2.65 (L) 4.0 - 5.0 M/UL    Hemoglobin 7.8 (L) 12.0 - 15.0 GM/DL    Hematocrit 96.0 (L) 36 - 45 %    MCV 92.5 80 - 100 FL    MCH 29.5 26 - 34 PG    MCHC 32.0 32.0 - 36.0 G/DL    RDW 45.4 11 - 15 %    Platelet Count 169 150 - 400 K/UL    MPV 7.9 7 - 11 FL         POC Review (24h)            Radiology Review     Pertinent radiology reviewed.      Melvyn Novas, MD FACP  [facility] [employer] [phone]

## 2023-04-06 ENCOUNTER — Inpatient Hospital Stay
Admit: 2023-03-31 | Discharge: 2023-04-07 | Disposition: A | Discharge status: Home / Self Care | Payer: MEDICARE | Source: Ambulatory Visit

## 2023-04-06 ENCOUNTER — Inpatient Hospital Stay: Admit: 2023-04-06 | Discharge: 2023-04-06 | Payer: MEDICARE

## 2023-04-06 ENCOUNTER — Encounter: Admit: 2023-04-06 | Discharge: 2023-04-06 | Payer: MEDICARE

## 2023-04-06 DIAGNOSIS — J123 Human metapneumovirus pneumonia: Secondary | ICD-10-CM

## 2023-04-06 DIAGNOSIS — C9110 Chronic lymphocytic leukemia of B-cell type not having achieved remission: Secondary | ICD-10-CM

## 2023-04-06 DIAGNOSIS — M1990 Unspecified osteoarthritis, unspecified site: Secondary | ICD-10-CM

## 2023-04-06 LAB — TYPE & CROSSMATCH
ANTIBODY SCREEN: NEGATIVE
BLOOD EXPIRATION DATE: 202
BLOOD EXPIRATION DATE: 202
CODING STATUS: 620
CODING STATUS: 620
ISSUE DATE TIME: 202
ISSUE DATE TIME: 202
PRODUCT CODE: 0
PRODUCT CODE: 0
UNIT DIVISION: 0
UNIT DIVISION: 0
UNITS ORDERED: 2

## 2023-04-06 LAB — CULTURE-BLOOD W/SENSITIVITY

## 2023-04-06 LAB — CBC
HEMATOCRIT: 29 % — ABNORMAL LOW (ref 36–45)
HEMOGLOBIN: 9.7 g/dL — ABNORMAL LOW (ref 12.0–15.0)
MCV: 92 FL (ref 80–100)
RBC COUNT: 3.1 M/UL — ABNORMAL LOW (ref 4.0–5.0)
WBC COUNT: 56 10*3/uL — ABNORMAL HIGH (ref 4.5–11.0)

## 2023-04-06 LAB — CBC AND DIFF
HEMATOCRIT: 26 % — ABNORMAL LOW (ref 36–45)
HEMOGLOBIN: 8.7 g/dL — ABNORMAL LOW (ref 12.0–15.0)
MCH: 29 pg — ABNORMAL HIGH (ref 26–34)
MCHC: 32 g/dL — ABNORMAL HIGH (ref 32.0–36.0)
MCV: 91 FL (ref 80–100)
MPV: 7.6 FL (ref 7–11)
PLATELET COUNT: 177 10*3/uL — ABNORMAL LOW (ref 150–400)
RBC COUNT: 2.9 M/UL — ABNORMAL LOW (ref 4.0–5.0)
RDW: 13 % — ABNORMAL LOW (ref 11–15)
WBC COUNT: 49 10*3/uL — ABNORMAL HIGH (ref 4.5–11.0)

## 2023-04-06 LAB — PERIPHERAL SMEAR

## 2023-04-06 MED ORDER — FLUMAZENIL 0.1 MG/ML IV SOLN
.2 mg | INTRAVENOUS | 0 refills | Status: DC | PRN
Start: 2023-04-06 — End: 2023-04-07

## 2023-04-06 MED ORDER — NALOXONE 0.4 MG/ML IJ SOLN
.08 mg | INTRAVENOUS | 0 refills | Status: DC | PRN
Start: 2023-04-06 — End: 2023-04-07

## 2023-04-06 MED ORDER — MIDAZOLAM 1 MG/ML IJ SOLN
1 mg | Freq: Once | INTRAVENOUS | 0 refills | Status: CP
Start: 2023-04-06 — End: ?
  Administered 2023-04-06: 17:00:00 1 mg via INTRAVENOUS

## 2023-04-06 MED ORDER — MIDAZOLAM 1 MG/ML IJ SOLN
0 refills | Status: CP
Start: 2023-04-06 — End: ?

## 2023-04-06 MED ORDER — FENTANYL CITRATE (PF) 50 MCG/ML IJ SOLN
0 refills | Status: CP
Start: 2023-04-06 — End: ?

## 2023-04-06 MED ORDER — LOPERAMIDE 2 MG PO CAP
2 mg | ORAL | 0 refills | Status: DC | PRN
Start: 2023-04-06 — End: 2023-04-07
  Administered 2023-04-06 – 2023-04-07 (×2): 2 mg via ORAL

## 2023-04-06 MED ORDER — SODIUM CHLORIDE 0.9 % IV SOLP
1000 mL | Freq: Once | INTRAVENOUS | 0 refills | Status: DC
Start: 2023-04-06 — End: 2023-04-07

## 2023-04-06 NOTE — Progress Notes
General Progress Note    Name: Lindsey Brown        MRN: [id number]          DOB: [birth year]            Age: 67 y.o.  Admission Date: 03/31/2023       LOS: 6 days    Date of Service: 04/06/2023    Assessment & Plan      Principal Problem:    Acute hypoxic respiratory failure (HCC)  Active Problems:    CLL (chronic lymphocytic leukemia) (HCC)    AKI (acute kidney injury) (HCC)      Lindsey Brown is a 67 y.o.  female with history of CLL, CKD? with   baseline creatinine of 0.7 as of Oct 2023, vitamin B12 deficincy, HTN who presents with shortness of breath and cough and was admitted for acute hypoxic respiratory failure and pneumonia.     #Pneumonia  #Human metapneumovirus   #Concern for superimposed bacterial pneumonia  # acute hypoxic respiratory failure, most likely due to above  # SIRS on admission: improving [tachycardia on admission: resolved. Wbc elevation]  -At baseline, patient does not use oxygen.  No limitation in activity.  -Patient recently has uncontrolled hypertension.  He was told it is unclear if it is causing kidney damage or if the kidney dysfunction causing the hypertension.  -She was taking lisinopril 10 mg.  Her kidney function on October at primary care physician office was 0.7.  The creatinine increased to 1.2 in February 2023.  It was 1.5 approximately 2 weeks before this hospitalization.  -Her blood pressure recently has not been controlled until amlodipine/olmesartan combination started by primary care physician.  Since then, kidney function got worse as well  -No obstructive symptoms.  -Felt cough with upper respiratory symptoms followed by sputum production of yellowish sputum specially at the time she was admitted.  This improved with antibiotic therapy.  No hemoptysis.  -No known sick contacts.  Has been with family  -Denied any vomiting, diarrhea but her kidney function was getting slightly worse  -She wanted to double check with her hematologist before treatment for CLL since her kidney function was a little bit worse.  - treated with 2 L IV fluids and cefepime in hematology clinic.  Discharged with Levaquin  - RVP positive for human metapneumovirus 4/9  - Chest 2 view 03/31/23:  ill-defined left greater than right perihilar and bibasilar opacities may reflect multifocal pneumonia. Recommend follow-up 2 view chest radiograph in 6-8 weeks to ensure improvement.   -MRSA screen negative  -Procalcitonin slightly up, could be related to acute kidney injury  -Legionella negative.  Chlamydia pneumonia negative.  Unlikely to be mycoplasma  PLAN  > Albuterol given wheezing on exam.    > Repeat CXR in 6-8 weeks  > Stopped doxycyline on 4/11 [atypical bacteria pneumonia workup negative ]. Will finish 7 days of Rocephin today and will plan to stop after this.  > blood culture x 2, no growth to date  > Wean off O2 as able. She was on RA this morning     #CLL  #Leukocytosis  > CBC with diff daily and transfuse for Hgb<7  > Hematology team consulted. Follow up was rescheduled for Wed 4/17     #AKI  #CKD - 1-2?  #Proteinuria  - creatinine at [facility] on 06/30/2022 1.18  - unclear baseline creatinine; per outside chart review creatinine 1.42 (02/05/23)  -She was taking lisinopril 10 mg.  Her  kidney function on October at primary care physician office was 0.7.  The creatinine increased to 1.2 in February 2023.  It was 1.5 approximately 2 weeks before this hospitalization.  - unlikely pre-renal in setting of poor oral intake: Most likely renal related to olmesartan  - treated with 2 L IV fluid in oncology clinic  - UA with + protein, wbc 2-10  - cr 1.22 June 2022 careeverywhere. Previously 0.7  - renal ultrasond, no hydronephrosis.  Normal kidney size suggest recent injury rather than chronic kidney injury  - Nephrology consulted.  PLAN  > Cont to hold Olmesartan  > Iron infusion therapy.  Discussed benefit and risk.  All questions answered  > Plan for renal bx today. Will need urine check and CBC 5 hours after procedure to ensure no complication. Also needs to monitored overnight.     #Vitamin B12 deficiency  > Continue vitamin B12 supplement     #HTN  - Echo was normal.   > Cont to hold PTA olmesartan 20 mg with AKI  > PTA amlodpine 10 mg   > If BP becomes uncontrolled, will consider addition of Coreg 6.25 mg BID                                             FEN:  - No IVFs.  - Replace lytes prn.  - DIET REGULAR    PPx:  - SCDs. Heparin (on hold for renal biopsy)    Code:  - Full Code .    Dispo:  - Admit to medicine. Likely DC tomorrow.      High complexity case due to acute kidney injury, independently reviewed more than 3 labs, reviewed notes outside my specialty, discussion with hematology, consultation with nephrology    Melvyn Novas, MD FACP  Clinical Assistant Professor - Internal Medicine Dept  [employer] P- [phone]  ________________________________________________________________________      Subjective     Lindsey Brown is a 67 y.o. female.  Patient was seen and examined. No acute events overnight.  Reports some loose stools. Denies any pain. Breathing comfortable with occasional dry cough.      Medications     Scheduled Meds:amLODIPine (NORVASC) tablet 10 mg, 10 mg, Oral, QDAY  atorvastatin (LIPITOR) tablet 10 mg, 10 mg, Oral, QDAY  cefTRIAXone (ROCEPHIN) IVP 2 g, 2 g, Intravenous, Q24H*  CHOLEcalciferoL (vitamin D3) tablet 1,000 Units, 1,000 Units, Oral, QDAY  cyanocobalamin (vitamin B-12) tablet 500 mcg, 500 mcg, Oral, QDAY  [Held by Provider] heparin (porcine) PF syringe 5,000 Units, 5,000 Units, Subcutaneous, Q8H    Continuous Infusions:  PRN and Respiratory Meds:albuterol sulfate Q6H PRN, ALPRAZolam BID PRN, benzonatate TID PRN, melatonin QHS PRN, ondansetron Q6H PRN, ondansetron (ZOFRAN) IV Q6H PRN, polyethylene glycol 3350 QDAY PRN, sennosides-docusate sodium QDAY PRN        Review of Systems     10-ROS otherwise negative, aside from what is noted above      Objective Vital Signs: Last Filed                 Vital Signs: 24 Hour Range   BP: 145/67 (04/15 0858)  Temp: 36.7 ?C (98.1 ?F) (04/15 3244)  Pulse: 102 (04/15 0858)  Respirations: 16 PER MINUTE (04/15 0858)  SpO2: 93 % (04/15 0858)  O2 Device: None (Room air) (04/15 0858) BP: (125-147)/(58-81)   Temp:  [  36.6 ?C (97.9 ?F)-37 ?C (98.6 ?F)]   Pulse:  [91-107]   Respirations:  [16 PER MINUTE]   SpO2:  [92 %-94 %]   O2 Device: None (Room air)     Vitals:    04/02/23 0948 04/03/23 1048   Weight: 82.7 kg (182 lb 6.4 oz) 82.7 kg (182 lb 5.1 oz)               Intake/Output (24h)       Intake/Output Summary (Last 24 hours) at 04/06/2023 2595  Last data filed at 04/06/2023 0331  Gross per 24 hour   Intake 600 ml   Output 75 ml   Net 525 ml             Physical Exam       General:  Alert, cooperative, no distress  Head:  Normocephalic, atraumatic  Neck:  Supple, symmetrical, trachea midline, no adenopathy  Lungs:  Clear to auscultation bilaterally, no wheezing, no crackles no rales.  Heart:    Regular rate and rhythm, S1, S2 normal, no murmur, click rub or gallop  Abdomen:  Soft, non-tender.  Bowel sounds normal.  No masses.  No organomegaly.  Extremities:  No cyanosis or edema  Pulses:   2+ and symmetric, all extremities  Neurologic:  A&O x3, No focal deficits.        Lab Review     24-hour labs:    Results for orders placed or performed during the hospital encounter of 03/31/23 (from the past 24 hour(s))   CBC AND DIFF    Collection Time: 04/06/23  6:49 AM   Result Value Ref Range    White Blood Cells 49.6 (H) 4.5 - 11.0 K/UL    RBC 2.92 (L) 4.0 - 5.0 M/UL    Hemoglobin 8.7 (L) 12.0 - 15.0 GM/DL    Hematocrit 63.8 (L) 36 - 45 %    MCV 91.8 80 - 100 FL    MCH 29.9 26 - 34 PG    MCHC 32.6 32.0 - 36.0 G/DL    RDW 75.6 11 - 15 %    Platelet Count 177 150 - 400 K/UL    MPV 7.6 7 - 11 FL   COMPREHENSIVE METABOLIC PANEL    Collection Time: 04/06/23  6:49 AM   Result Value Ref Range    Sodium 145 137 - 147 MMOL/L    Potassium 3.9 3.5 - 5.1 MMOL/L    Chloride 113 (H) 98 - 110 MMOL/L    Glucose 88 70 - 100 MG/DL    Blood Urea Nitrogen 30 (H) 7 - 25 MG/DL    Creatinine 4.33 (H) 0.4 - 1.00 MG/DL    Calcium 8.1 (L) 8.5 - 10.6 MG/DL    Total Protein 4.9 (L) 6.0 - 8.0 G/DL    Total Bilirubin 0.3 0.3 - 1.2 MG/DL    Albumin 2.7 (L) 3.5 - 5.0 G/DL    Alk Phosphatase 90 25 - 110 U/L    AST (SGOT) 25 7 - 40 U/L    CO2 23 21 - 30 MMOL/L    ALT (SGPT) 20 7 - 56 U/L    Anion Gap 9 3 - 12    eGFR 30 (L) >60 mL/min   MAGNESIUM    Collection Time: 04/06/23  6:49 AM   Result Value Ref Range    Magnesium 1.7 1.6 - 2.6 mg/dL         POC Review (29J)     Glucose: 88 (04/06/23  1610)      Radiology Review     Pertinent radiology reviewed.      Melvyn Novas, MD FACP  [employer] [phone]

## 2023-04-06 NOTE — Progress Notes
Sedation physician present in room. Recent vitals and patient condition reviewed between sedating physician and nurse. Reassessment completed. Determination made to proceed with planned sedation.

## 2023-04-06 NOTE — Progress Notes
VSS. No c/o pain, nausea, chest pain, SOA. Dressing CDI. Pt denies any questions or concerns at this time. Pt transported back to inpt room, via cart, accompanied by spouse.

## 2023-04-06 NOTE — Unmapped
Immediate Post Procedure Note    Date:  04/06/2023                                         Attending Physician:  Peter Garter, MD    Consent:  Consent obtained from patient.  Time out performed: Consent obtained, correct patient verified, correct procedure verified, correct site verified, patient marked as necessary.  Pre/Post Procedure Diagnosis/Indication:  Medical renal disease    Anesthesia: Conscious  Procedure(s):  Ultrasound Guided Kidney Biopsy. Please see separate PACS dictation for further detail.  Findings: Uncomplicated biopsy    Estimated Blood Loss:  None/Negligible  Specimen(s) Removed/Disposition:  Four 18 gauge cores.  Complications: None  Patient Tolerated Procedure: Well  Post-Procedure Condition:  Stable    Peter Garter MD

## 2023-04-06 NOTE — Progress Notes
Renal Progress Note    Name:  Lindsey Brown   [id number] Date:  04/06/2023  Admission Date: 03/31/2023  LOS: 6 days        Assessment and Plan   Principal Problem:    Acute hypoxic respiratory failure (HCC)  Active Problems:    CLL (chronic lymphocytic leukemia) (HCC)    AKI (acute kidney injury) (HCC)        Lindsey Brown is a 67 y.o. 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  Keep HOB < 30 degrees  - Monitor and record vitals Q15 min X 1 hour, then Q30 min X 1 hour, then Q1 hour X six hours   - Keep BP less than 145/90  - Check CBC 5 hours after renal biopsy.   - If patient has severe pain, hematuria, hypotension or drop in Hb please get CT Abd/pelvis without contrast    -will arrange for renal clinic follow up. Can dc tomorrow from a renal stand point if stable          Aundria Mems, MD  Pager [phone]   _____________________________________________________________________________      Subjective  Lindsey Brown is a 67 y.o. female feels OK. Off oxygen      Medications    MEDSamLODIPine, 10 mg, Oral, QDAY  atorvastatin, 10 mg, Oral, QDAY  CHOLEcalciferoL (vitamin D3), 1,000 Units, Oral, QDAY  cyanocobalamin (vitamin B-12), 500 mcg, Oral, QDAY  [Held by Provider] heparin (porcine), 5,000 Units, Subcutaneous, Q8H  sodium chloride 0.9% (NS), 1,000 mL, Intravenous, ONCE     IV MEDS  Prn albuterol sulfate Q6H PRN, ALPRAZolam BID PRN 0.25 mg at 04/05/23 2149, benzonatate TID PRN 100 mg at 04/04/23 2218, flumazeniL PRN, loperamide PRN 2 mg at 04/06/23 0943, melatonin QHS PRN 5 mg at 04/05/23 2149, nalOXone PRN, ondansetron Q6H PRN, ondansetron (ZOFRAN) IV Q6H PRN 4 mg at 03/31/23 2217, polyethylene glycol 3350 QDAY PRN, sennosides-docusate sodium QDAY PRN 1 tablet at 04/01/23 0912       Physical Exam     Vital Signs: Last Filed In 24 Hours Vital Signs: 24 Hour Range   BP: 140/65 (04/15 2110)  Temp: 37.2 ?C (99 ?F) (04/15 2110)  Pulse: 101 (04/15 2110)  Respirations: 16 PER MINUTE (04/15 2110)  SpO2: 94 % (04/15 2110)  O2 Device: None (Room air) (04/15 2110)  O2 Liter Flow: 2 Lpm (04/15 1330) BP: (111-154)/(58-88)   Temp:  [36.4 ?C (97.6 ?F)-37.2 ?C (99 ?F)]   Pulse:  [78-112]   Respirations:  [  10 PER MINUTE-21 PER MINUTE]   SpO2:  [92 %-99 %]   O2 Device: None (Room air)  O2 Liter Flow: 2 Lpm            Intake/Output Summary (Last 24 hours) at 04/06/2023 2134  Last data filed at 04/06/2023 0901  Gross per 24 hour   Intake 330 ml   Output 775 ml   Net -445 ml      Vitals:    04/02/23 0948 04/03/23 1048   Weight: 82.7 kg (182 lb 6.4 oz) 82.7 kg (182 lb 5.1 oz)         Gen: Alert and Oriented   HEENT: Sclera normal   CV: no JVD, S1 and S2 normal, no rubs, murmurs or gallops   Pulm: Clear to auscultation bilateral   GI: BS+ x4, non-tender to palpation  Neuro: Grossly normal, moving all extremities, speech intact   Ext: no edema  Skin: no rash       Labs  Recent Labs     04/04/23  0628 04/05/23  0745 04/06/23  0649   NA 142 143 145   K 3.8 3.7 3.9   CL 110 110 113*   CO2 21 22 23    GAP 11 11 9    BUN 30* 30* 30*   CR 2.14* 2.04* 1.86*   GLU 81 84 88   CA 7.8* 7.8* 8.1*   ALBUMIN 2.6* 2.6* 2.7*   MG 1.7 1.7 1.7       Recent Labs     04/04/23  0628 04/05/23  0745 04/06/23  0649 04/06/23  1735   WBC 43.6* 45.3* 49.6* 56.0*   HGB 8.1* 7.8* 8.7* 9.7*   HCT 24.8* 24.5* 26.8* 29.5*   PLTCT 153 169 177 197   AST 23 22 25   --    ALT 13 14 20   --    ALKPHOS 97 88 90  --       Estimated Creatinine Clearance: 34 mL/min (A) (based on SCr of 1.86 mg/dL (H)).  Vitals:    04/02/23 0948 04/03/23 1048   Weight: 82.7 kg (182 lb 6.4 oz) 82.7 kg (182 lb 5.1 oz)    No results for input(s): PHART, PO2ART in the last 72 hours.    Invalid input(s): PC02A          Aundria Mems, MD  Pager [phone]

## 2023-04-07 ENCOUNTER — Encounter: Admit: 2023-04-07 | Discharge: 2023-04-07 | Payer: MEDICARE

## 2023-04-07 DIAGNOSIS — Z8349 Family history of other endocrine, nutritional and metabolic diseases: Secondary | ICD-10-CM

## 2023-04-07 DIAGNOSIS — M1990 Unspecified osteoarthritis, unspecified site: Secondary | ICD-10-CM

## 2023-04-07 DIAGNOSIS — E538 Deficiency of other specified B group vitamins: Secondary | ICD-10-CM

## 2023-04-07 DIAGNOSIS — I129 Hypertensive chronic kidney disease with stage 1 through stage 4 chronic kidney disease, or unspecified chronic kidney disease: Secondary | ICD-10-CM

## 2023-04-07 DIAGNOSIS — Z87891 Personal history of nicotine dependence: Secondary | ICD-10-CM

## 2023-04-07 DIAGNOSIS — C9110 Chronic lymphocytic leukemia of B-cell type not having achieved remission: Secondary | ICD-10-CM

## 2023-04-07 DIAGNOSIS — N189 Chronic kidney disease, unspecified: Secondary | ICD-10-CM

## 2023-04-07 DIAGNOSIS — Z880 Allergy status to penicillin: Secondary | ICD-10-CM

## 2023-04-07 DIAGNOSIS — N179 Acute kidney failure, unspecified: Secondary | ICD-10-CM

## 2023-04-07 DIAGNOSIS — Z79899 Other long term (current) drug therapy: Secondary | ICD-10-CM

## 2023-04-07 DIAGNOSIS — Z825 Family history of asthma and other chronic lower respiratory diseases: Secondary | ICD-10-CM

## 2023-04-07 DIAGNOSIS — Z809 Family history of malignant neoplasm, unspecified: Secondary | ICD-10-CM

## 2023-04-07 DIAGNOSIS — R000 Tachycardia, unspecified: Secondary | ICD-10-CM

## 2023-04-07 DIAGNOSIS — Z23 Encounter for immunization: Secondary | ICD-10-CM

## 2023-04-07 DIAGNOSIS — D649 Anemia, unspecified: Secondary | ICD-10-CM

## 2023-04-07 DIAGNOSIS — Z8261 Family history of arthritis: Secondary | ICD-10-CM

## 2023-04-07 DIAGNOSIS — Z833 Family history of diabetes mellitus: Secondary | ICD-10-CM

## 2023-04-07 DIAGNOSIS — Z8249 Family history of ischemic heart disease and other diseases of the circulatory system: Secondary | ICD-10-CM

## 2023-04-07 LAB — CBC AND DIFF
ABSOLUTE NEUTROPHIL COUNT MANUAL: 5.8 10*3/uL (ref 1.8–7.0)
EOSINOPHIL %: 1 % — ABNORMAL LOW (ref >60)
LYMPHOCYTES: 85 % — ABNORMAL HIGH (ref 24–44)
MCH: 30 pg — ABNORMAL LOW (ref 26–34)
MCHC: 33 g/dL (ref 32.0–36.0)
MONOCYTES %: 2 % — ABNORMAL LOW (ref 4–12)
MPV: 7.5 FL (ref 7–11)
NEUTROPHILS,SEG: 11 % — ABNORMAL LOW (ref 41–77)
OTHER: 1 %
PLATELET COUNT: 205 10*3/uL (ref 150–400)
RBC COUNT: 2.9 M/UL — ABNORMAL LOW (ref 4.0–5.0)
RDW: 14 % — ABNORMAL LOW (ref 11–15)
WBC COUNT: 53 10*3/uL — ABNORMAL HIGH (ref 4.5–11.0)

## 2023-04-07 LAB — COMPREHENSIVE METABOLIC PANEL
BLD UREA NITROGEN: 28 mg/dL — ABNORMAL HIGH (ref 7–25)
CALCIUM: 8 mg/dL — ABNORMAL LOW (ref 8.5–10.6)
CREATININE: 1.9 mg/dL — ABNORMAL HIGH (ref 0.4–1.00)
POTASSIUM: 3.9 MMOL/L (ref 3.5–5.1)
SODIUM: 143 MMOL/L (ref 137–147)

## 2023-04-07 LAB — CBC
HEMATOCRIT: 31 % — ABNORMAL LOW (ref 36–45)
HEMOGLOBIN: 10 g/dL — ABNORMAL LOW (ref 12.0–15.0)
MCH: 30 pg (ref 26–34)
MCHC: 33 g/dL (ref 32.0–36.0)
MCV: 91 FL (ref 80–100)
MPV: 7.8 FL (ref 7–11)
PLATELET COUNT: 244 10*3/uL (ref 150–400)
RBC COUNT: 3.4 M/UL — ABNORMAL LOW (ref 4.0–5.0)
RDW: 14 % (ref 11–15)
WBC COUNT: 72 10*3/uL — ABNORMAL HIGH (ref 4.5–11.0)

## 2023-04-07 LAB — FLOW CYTOMETRY

## 2023-04-07 MED ORDER — ONDANSETRON 4 MG PO TBDI
4 mg | ORAL_TABLET | ORAL | 0 refills | 8.00000 days | Status: AC | PRN
Start: 2023-04-07 — End: ?

## 2023-04-07 MED ORDER — AMLODIPINE 10 MG PO TAB
10 mg | ORAL_TABLET | Freq: Every day | ORAL | 1 refills | Status: AC
Start: 2023-04-07 — End: ?

## 2023-04-07 NOTE — Progress Notes
Discharge Day Note    SUBJECTIVE:  Patient is seen and examined. No acute events overnight. No new complains. No hematuria. Breathing is stable.     ROS: No CP, SOA, abd pain, N/V, dysuria, hematuria, fevers, chills or night sweats.    OBJECTIVE:  Physical exam is unchanged.    VS and labs reviewed. Stable with stable Hgb.    Meds reviewed.      Medication List        START taking these medications      amLODIPine 10 mg tablet  Commonly known as: NORVASC  Take one tablet by mouth daily. Indications: high blood pressure     ondansetron 4 mg rapid dissolve tablet  Commonly known as: ZOFRAN ODT  Dissolve one tablet by mouth every 6 hours as needed. Place on tongue to dissolve.  Indications: nausea and vomiting            CONTINUE taking these medications      ALPRAZolam 0.25 mg tablet  Commonly known as: XANAX     atorvastatin 10 mg tablet  Commonly known as: LIPITOR     cyanocobalamin (vitamin B-12) 500 mcg tablet     ferrous sulfate 325 mg (65 mg iron) tablet  Commonly known as: FEOSOL     VITAMIN D PO            STOP taking these medications      amLODIPine-olmesartan 10-20 mg tablet  Commonly known as: AZOR     levoFLOXacin 750 mg tablet  Commonly known as: LEVAQUIN               Where to Get Your Medications        These medications were sent to [facility] [address] - [place], [place] - [address] [place] [address] [place] [address], [place] [address]      Phone: [phone]   amLODIPine 10 mg tablet  ondansetron 4 mg rapid dissolve tablet            IMPRESSION:  Principal Problem:    Acute hypoxic respiratory failure (HCC)  Active Problems:    CLL (chronic lymphocytic leukemia) (HCC)    AKI (acute kidney injury) (HCC)      PLAN:  DC home today. F/U with heme tomorrow. F/U with PCP within 1 week for BP check and management.    Plan was discussed in length with the patient. All questions were answered to her best satisfaction.    I spent more than 30 minutes evaluating the patient, reviewing medical chart, directing medication management, reviewing vitals, labs, radiology results and coordinating discharge. More than 50% of the time was spent in face-to-face contact with the patient at bedside.    Melvyn Novas, MD  Clinical Assistant Professor - Internal Medicine Dept  [employer] [phone]

## 2023-04-07 NOTE — Progress Notes
Discharge paperwork reviewed with pt & spouse. All questions addressed, no further concerns at this time. PIV removed. Taken to front door via wheelchair.

## 2023-04-07 NOTE — Progress Notes
Renal Progress Note       Admission Date: 03/31/2023                                                LOS: 7 days      Principal Problem:    Acute hypoxic respiratory failure (HCC)  Active Problems:    CLL (chronic lymphocytic leukemia) (HCC)    AKI (acute kidney injury) (HCC)        ASSESSMENT     Lindsey Brown is a 67 y.o. female admitted for dyspnea     AKI  -serum creatinine was 1.42mg /dl in Feb 2024  -urinalysis with 3+ protein and pro/cr ratio is 7.5, has hematuria as well  -creatinine on admission was 2.27mg /dl and has remained in that range  -renal ultrasound L>R kidney in size, no hydronephrosis  -Cr slightly better but remains above baseline     Proteinuria  -has low albumin  -kappa in urine immunofixation  -spep norm. 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 No evidence of leukemic infiltrate   -Await EM studies  -CLL treatment plan per hematology.  -Avoid contrast and NSAIDs  -Daily weight  -Monitor BP closely  -Obtain CMP 1-2 weeks post discharge  -F/u in the nephrology clinic with Dr Mercie Eon on 4/30.     Total Time Today was 50 minutes in the following activities: Preparing to see the patient, Obtaining and/or reviewing separately obtained history, Performing a medically appropriate examination and/or evaluation, Counseling and educating the patient/family/caregiver, Ordering medications, tests, or procedures, Referring and communication with other health care professionals (when not separately reported), Documenting clinical information in the electronic or other health record, and Independently interpreting results (not separately reported) and communicating results to the patient/family/caregiver     Carollee Sires, MD  Pager [phone]  __________________________________________________________________________________    Interval History:    No acute events, stable Hgb post biopsy, no pain at the site of biopsy    Medications:  amLODIPine, 10 mg, Oral, QDAY  atorvastatin, 10 mg, Oral, QDAY  CHOLEcalciferoL (vitamin D3), 1,000 Units, Oral, QDAY  cyanocobalamin (vitamin B-12), 500 mcg, Oral, QDAY  [Held by Provider] heparin (porcine), 5,000 Units, Subcutaneous, Q8H                Vital Signs:  Last Filed in 24 hours Vital Signs:  24 hour Range    BP: 139/71 (04/16 0831)  Temp: 36.9 ?C (98.4 ?F) (04/16 0831)  Pulse: 99 (04/16 0831)  Respirations: 16 PER MINUTE (04/16 0831)  SpO2: 92 % (04/16 0831)  O2 Device: None (Room air) (04/16 0831)  O2 Liter Flow: 2 Lpm (04/15 1330) BP: (111-141)/(61-80)   Temp:  [36.3 ?C (97.3 ?F)-37.2 ?C (99 ?F)]   Pulse:  [78-103]   Respirations:  [11 PER MINUTE-19 PER MINUTE]   SpO2:  [92 %-97 %]   O2 Device: None (Room air)  O2 Liter Flow: 2 Lpm     Intake/Output Summary (Last 24 hours)  at 04/07/2023 1210  Last data filed at 04/07/2023 0855  Gross per 24 hour   Intake 400 ml   Output 750 ml   Net -350 ml      Vitals:    04/02/23 0948 04/03/23 1048   Weight: 82.7 kg (182 lb 6.4 oz) 82.7 kg (182 lb 5.1 oz)       Physical Exam:  GENERAL: Not in pain or distress  HEAD: Normocephalic, atraumatic  OP: MMM, not icteric  LUNGS: CTA bil  CVP: RRR, no murmur  ABD: Soft, NT, + BS  LE: No edema      Lab/Radiology/Other Diagnostic Tests:  Results for orders placed or performed during the hospital encounter of 03/31/23 (from the past 48 hour(s))   CBC AND DIFF    Collection Time: 04/06/23  6:49 AM   # # Margarito Liner    White Blood Cells 49.6 (H) 4.5 - 11.0 K/UL    RBC 2.92 (L) 4.0 - 5.0 M/UL    Hemoglobin 8.7 (L) 12.0 - 15.0 GM/DL    Hematocrit 16.1 (L) 36 - 45 %    MCV 91.8 80 - 100 FL    MCH 29.9 26 - 34 PG    MCHC 32.6 32.0 - 36.0 G/DL    RDW 09.6 11 - 15 %    Platelet Count 177 150 - 400 K/UL    MPV 7.6 7 - 11 FL    Segmented Neutrophils 5 (L) 41 - 77 %    Lymphocytes 87 (H) 24 - 44 %    Monocytes 6 4 - 12 %    Other Cells 2 %    Platelet Estimate NORMAL     Absolute Neutrophil Count Manual 2.48 1.8 - 7.0 K/UL   COMPREHENSIVE METABOLIC PANEL    Collection Time: 04/06/23  6:49 AM   # # Low-High    Sodium 145 137 - 147 MMOL/L    Potassium 3.9 3.5 - 5.1 MMOL/L    Chloride 113 (H) 98 - 110 MMOL/L    Glucose 88 70 - 100 MG/DL    Blood Urea Nitrogen 30 (H) 7 - 25 MG/DL    Creatinine 0.45 (H) 0.4 - 1.00 MG/DL    Calcium 8.1 (L) 8.5 - 10.6 MG/DL    Total Protein 4.9 (L) 6.0 - 8.0 G/DL    Total Bilirubin 0.3 0.3 - 1.2 MG/DL    Albumin 2.7 (L) 3.5 - 5.0 G/DL    Alk Phosphatase 90 25 - 110 U/L    AST (SGOT) 25 7 - 40 U/L    CO2 23 21 - 30 MMOL/L    ALT (SGPT) 20 7 - 56 U/L    Anion Gap 9 3 - 12    eGFR 30 (L) >60 mL/min   MAGNESIUM    Collection Time: 04/06/23  6:49 AM   # # Low-High    Magnesium 1.7 1.6 - 2.6 mg/dL   FLOW CYTOMETRY    Collection Time: 04/06/23 12:15 PM   # # Low-High    PATHOLOGY REPORT       [facility]  [email or web address]    Mirian Mo, MD, Director of Flow Cytometry Laboratory  Department of Pathology and Laboratory Medicine  [address] [place], [place] [address]    Surgical Pathology Office:  [phone]  Fax:  [phone]  FLOW CYTOMETRY REPORT    NAME: Emily, Jerelene A. SURG PATH #: [id number] MR #: [id number] SPECIMEN  CLASS: LC BILLING #: [id number] ALT  ID #:  LOCATION: [facility] DATE OF  PROCEDURE: 04/06/2023 AGE:  66 SEX: F DATE RECEIVED: 04/06/2023 DOB:  Nov 10, 1956  TIME RECEIVED:  12:39 PHYSICIAN: MICHAEL ROUSE, DO DATE OF  REPORT: 04/07/2023 COPY TO: Dorann Ou, MD  KELSIE Jonathon Resides, MD  [facility] Jake Shark, MD  Melvyn Novas, MD DATE OF PRINTING: 04/07/2023 Material Received:  A: Left Renal Biopsy    History:  67 year old female      ########################################################################  Final Diagnosis:  Left kidney, biopsy, flow cytometry:  Quantity not sufficient for analysis        Attestation:  By  this signature, I attest that I have personally formulated the final  interpretation expressed in this report and that the above diagnosis is  based upon my examination of the slides and/or other material indicated in  this report.  +++Electronically Signed Out By+++              Hazel Dell/04/06/2023                                                                Interpreted by: Santo Held, MD  04/07/2023   ########################################################################                         CBC    Collection Time: 04/06/23  5:35 PM   # # Margarito Liner    White Blood Cells 56.0 (HH) 4.5 - 11.0 K/UL    RBC 3.19 (L) 4.0 - 5.0 M/UL    Hemoglobin 9.7 (L) 12.0 - 15.0 GM/DL    Hematocrit 16.1 (L) 36 - 45 %    MCV 92.5 80 - 100 FL    MCH 30.4 26 - 34 PG    MCHC 32.9 32.0 - 36.0 G/DL    RDW 09.6 11 - 15 %    Platelet Count 197 150 - 400 K/UL    MPV 7.5 7 - 11 FL   CBC    Collection Time: 04/07/23 12:03 AM   # # Low-High    White Blood Cells 72.0 (HH) 4.5 - 11.0 K/UL    RBC 3.44 (L) 4.0 - 5.0 M/UL    Hemoglobin 10.5 (L) 12.0 - 15.0 GM/DL    Hematocrit 04.5 (L) 36 - 45 %    MCV 91.8 80 - 100 FL    MCH 30.4 26 - 34 PG    MCHC 33.1 32.0 - 36.0 G/DL    RDW 40.9 11 - 15 %    Platelet Count 244 150 - 400 K/UL    MPV 7.8 7 - 11 FL   COMPREHENSIVE METABOLIC PANEL    Collection Time: 04/07/23  6:13 AM   # # Low-High    Sodium 143 137 - 147 MMOL/L    Potassium 3.9 3.5 - 5.1 MMOL/L    Chloride 111 (H) 98 - 110 MMOL/L    Glucose 91 70 - 100 MG/DL    Blood Urea Nitrogen 28 (H) 7 - 25 MG/DL    Creatinine 8.11 (H) 0.4 - 1.00 MG/DL    Calcium 8.0 (L) 8.5 - 10.6 MG/DL    Total Protein 5.0 (L) 6.0 - 8.0 G/DL    Total Bilirubin 0.3 0.3 - 1.2 MG/DL  Albumin 2.6 (L) 3.5 - 5.0 G/DL    Alk Phosphatase 91 25 - 110 U/L    AST (SGOT) 29 7 - 40 U/L    CO2 23 21 - 30 MMOL/L    ALT (SGPT) 28 7 - 56 U/L    Anion Gap 9 3 - 12    eGFR 27 (L) >60 mL/min   CBC AND DIFF    Collection Time: 04/07/23  6:13 AM   # # Margarito Liner    White Blood Cells 53.1 (HH) 4.5 - 11.0 K/UL    RBC 2.95 (L) 4.0 - 5.0 M/UL    Hemoglobin 9.0 (L) 12.0 - 15.0 GM/DL    Hematocrit 16.1 (L) 36 - 45 %    MCV 92.0 80 - 100 FL    MCH 30.3 26 - 34 PG    MCHC 33.0 32.0 - 36.0 G/DL    RDW 09.6 11 - 15 %    Platelet Count 205 150 - 400 K/UL    MPV 7.5 7 - 11 FL    Segmented Neutrophils 11 (L) 41 - 77 %    Lymphocytes 85 (H) 24 - 44 %    Monocytes 2 (L) 4 - 12 %    Eosinophil 1 0 - 5 %    Other Cells 1 %    Normal RBC Morph NORMAL     Platelet Estimate NORMAL     Absolute Neutrophil Count Manual 5.84 1.8 - 7.0 K/UL   MAGNESIUM    Collection Time: 04/07/23  6:13 AM   # # Low-High    Magnesium 1.6 1.6 - 2.6 mg/dL

## 2023-04-07 NOTE — Discharge Instructions - Appointments
Please follow up with your PCP within 1 week. If your BP becomes elevated by then, please discuss starting Carvedilol (Coreg) with him.

## 2023-04-07 NOTE — Case Management (ED)
Case Management Progress Note    NAME:Lindsey Brown                          MRN: [id number]              DOB:1956/09/04          AGE: 67 y.o.  ADMISSION DATE: 03/31/2023             DAYS ADMITTED: LOS: 7 days      Today's Date: 04/07/2023    PLAN: Discharge home with family support.     Expected Discharge Date: 04/07/2023   Is Patient Medically Stable: Yes   Are there Barriers to Discharge? no    INTERVENTION/DISPOSITION:  Discharge Planning              Discharge Planning: No Needs Identified  Transportation              Does the Patient Need Case Management to Arrange Discharge Transport? (ex: facility, ambulance, wheelchair/stretcher, Medicaid, cab, other): No  Will the Patient Use Family Transport?: Yes  Transportation Name, Phone and Availability #1: spouse [name]  Support              Support: Huddle/team update  Info or Referral              Information or Referral to [facility]: No Needs Identified  Positive SDOH Domains and Potential Barriers          Medication Needs              Medication Needs: No Needs Identified              Financial              Financial: No Needs Identified  Legal              Legal: No Needs Identified  Other              Other/None: No needs identified  Discharge Disposition                                    Selected Continued Care - Admitted Since 03/31/2023    No services have been selected for the patient.         Valentina Gu, BSN, Conservation officer, nature  Available on [employer]

## 2023-04-08 ENCOUNTER — Encounter: Admit: 2023-04-08 | Discharge: 2023-04-08 | Payer: MEDICARE

## 2023-04-08 DIAGNOSIS — M1990 Unspecified osteoarthritis, unspecified site: Secondary | ICD-10-CM

## 2023-04-08 DIAGNOSIS — C9110 Chronic lymphocytic leukemia of B-cell type not having achieved remission: Secondary | ICD-10-CM

## 2023-04-08 DIAGNOSIS — R509 Fever, unspecified: Secondary | ICD-10-CM

## 2023-04-08 LAB — COMPREHENSIVE METABOLIC PANEL
ALT: 35 U/L — ABNORMAL HIGH (ref 7–56)
AST: 32 U/L (ref 7–40)
CREATININE: 2.3 mg/dL — ABNORMAL HIGH (ref 0.4–1.00)
POTASSIUM: 3.8 MMOL/L (ref 3.5–5.1)
SODIUM: 142 MMOL/L (ref 137–147)
TOTAL BILIRUBIN: 0.4 mg/dL (ref 0.3–1.2)
TOTAL PROTEIN: 6.4 g/dL (ref 6.0–8.0)

## 2023-04-08 LAB — CBC AND DIFF
HEMOGLOBIN: 10 g/dL — ABNORMAL LOW (ref 12.0–15.0)
MCV: 93 FL (ref 80–100)
NEUTROPHILS,SEG: 3 % — ABNORMAL LOW (ref 41–77)
PLATELET COUNT: 271 K/UL (ref 150–400)
RBC COUNT: 3.4 M/UL — ABNORMAL LOW (ref 4.0–5.0)
RDW: 14 % — ABNORMAL LOW (ref 11–15)
WBC COUNT: 66 K/UL — ABNORMAL HIGH (ref 4.5–11.0)

## 2023-04-08 MED ORDER — ALLOPURINOL 300 MG PO TAB
ORAL_TABLET | ORAL | 0 refills | 45.00000 days | Status: AC
Start: 2023-04-08 — End: ?

## 2023-04-08 MED ORDER — ACETAMINOPHEN 500 MG PO TAB
1000 mg | Freq: Once | ORAL | 0 refills
Start: 2023-04-08 — End: ?

## 2023-04-08 MED ORDER — DIPHENHYDRAMINE HCL 50 MG PO CAP
50 mg | Freq: Once | ORAL | 0 refills
Start: 2023-04-08 — End: ?

## 2023-04-08 MED ORDER — OBINUTUZUMAB 1000MG IVPB (MAINT DOSE)
1000 mg | Freq: Once | INTRAVENOUS | 0 refills
Start: 2023-04-08 — End: ?

## 2023-04-08 MED ORDER — DEXAMETHASONE IVPB
20 mg | Freq: Once | INTRAVENOUS | 0 refills
Start: 2023-04-08 — End: ?

## 2023-04-08 MED ORDER — MEPERIDINE (PF) 25 MG/ML IJ SYRG
25 mg | INTRAVENOUS | 0 refills | PRN
Start: 2023-04-08 — End: ?

## 2023-04-08 MED ORDER — OBINUTUZUMAB  900MG IVPB (2ND DOSE)
900 mg | Freq: Once | INTRAVENOUS | 0 refills
Start: 2023-04-08 — End: ?

## 2023-04-08 MED ORDER — OBINUTUZUMAB  100MG IVPB (1ST DOSE)
100 mg | Freq: Once | INTRAVENOUS | 0 refills
Start: 2023-04-08 — End: ?

## 2023-04-08 MED ORDER — VENETOCLAX 10 MG-50 MG- 100 MG PO DSPK
ORAL_TABLET | ORAL | 0 refills | 30.00000 days | Status: AC
Start: 2023-04-08 — End: ?
  Filled 2023-04-29: qty 42, 28d supply, fill #1

## 2023-04-08 MED ORDER — ACYCLOVIR 400 MG PO TAB
400 mg | ORAL_TABLET | Freq: Two times a day (BID) | ORAL | 3 refills | Status: AC
Start: 2023-04-08 — End: ?

## 2023-04-08 NOTE — Progress Notes
Initial Assessment: Oral Chemotherapy  Venetoclax (Venclexta?)    Lindsey Brown is a 67 y.o. female with a diagnosis of CLL    Indication/Regimen  Venetoclax (Venclexta?) is being used appropriately for treatment of CLL.  The patient is receiving appropriate hydration and antihyperuricemic therapy based on TLS risk.        The dosing regimen below is appropriate for [employer]. She will initiate therapy on Cycle 2, Day 1.  Week 1: 20 mg by mouth once daily  Week 2: 50 mg by mouth once daily  Week 3: 100 mg by mouth once daily  Week 4: 200 mg by mouth once daily  Week 5 and thereafter: 400 mg by mouth once daily; continue until disease progression or unacceptable toxicity.      Therapeutic Goals  Plan Name OP HEME VENETOCLAX + OBINUTUZUMAB 100/900/1000 MG   Treatment Goal Control   Treatment Plan Start Date 04/14/2023 (Planned)         Medical History  Medical History:   Diagnosis Date    Arthritis         Patient Oncology History     Cancer Diagnosis:  Cancer Staging   CLL (chronic lymphocytic leukemia) (HCC)  Staging form: Chronic Lymphocytic Leukemia / Small Lymphocytic Lymphoma, AJCC 8th Edition  - Clinical stage from 02/05/2023: Modified Rai Stage III (Modified Rai risk: High, Lymphocytosis: Present, Adenopathy: Present, Organomegaly: Absent, Anemia: Present, Thrombocytopenia: Absent) - Signed by Violeta Gelinas, MD on 02/05/2023     Relevant tumor markers:    Past treatment regimens: none      Height, Weight, BSA    Wt Readings from Last 1 Encounters:   04/08/23 83.1 kg (183 lb 3.2 oz)     Estimated body surface area is 2 meters squared as calculated from the following:    Height as of 04/03/23: 172.7 cm (5' 7.99).    Weight as of this encounter: 83.1 kg (183 lb 3.2 oz).    Allergies  Allergies   Allergen Reactions    Pcn [Penicillins] HIVES     03/31/2023 patient reports developing hives when she was 17. Tolerates amoxicillin and cephalosporins.       Baseline Labs  CBC w/Diff    Lab Results Component Value Date/Time    WBC 66.0 (HH) 04/08/2023 11:24 AM    RBC 3.42 (L) 04/08/2023 11:24 AM    HGB 10.6 (L) 04/08/2023 11:24 AM    HCT 32.1 (L) 04/08/2023 11:24 AM    MCV 93.6 04/08/2023 11:24 AM    MCH 31.0 04/08/2023 11:24 AM    MCHC 33.1 04/08/2023 11:24 AM    RDW 14.0 04/08/2023 11:24 AM    PLTCT 271 04/08/2023 11:24 AM    MPV 7.2 04/08/2023 11:24 AM    Lab Results   Component Value Date/Time    NEUT 5 (L) 04/04/2023 06:28 AM    ANC 1.98 04/08/2023 11:24 AM    ANC 2.09 04/04/2023 06:28 AM    LYMA 93 (H) 04/04/2023 06:28 AM    ALC 40.38 (H) 04/04/2023 06:28 AM    MONA 2 (L) 04/04/2023 06:28 AM    AMC 0.82 (H) 04/04/2023 06:28 AM    EOSA 0 04/04/2023 06:28 AM    AEC 0.18 04/04/2023 06:28 AM    BASA 0 04/04/2023 06:28 AM    ABC 0.09 04/04/2023 06:28 AM          Comprehensive Metabolic Profile    Lab Results   Component Value Date/Time  NA 143 04/07/2023 06:13 AM    K 3.9 04/07/2023 06:13 AM    CL 111 (H) 04/07/2023 06:13 AM    CO2 23 04/07/2023 06:13 AM    GAP 9 04/07/2023 06:13 AM    BUN 28 (H) 04/07/2023 06:13 AM    CR 1.99 (H) 04/07/2023 06:13 AM    GLU 91 04/07/2023 06:13 AM    Lab Results   Component Value Date/Time    CA 8.0 (L) 04/07/2023 06:13 AM    PO4 4.8 (H) 04/03/2023 06:08 AM    ALBUMIN 2.6 (L) 04/07/2023 06:13 AM    TOTPROT 5.0 (L) 04/07/2023 06:13 AM    ALKPHOS 91 04/07/2023 06:13 AM    AST 29 04/07/2023 06:13 AM    ALT 28 04/07/2023 06:13 AM    TOTBILI 0.3 04/07/2023 06:13 AM    GFR >60 02/16/2020 10:55 AM    GFRAA >60 02/16/2020 10:55 AM        Serum creatinine: 1.99 mg/dL (H) 96/04/54 0981  Estimated creatinine clearance: 31.9 mL/min (A)    Pregnancy status  The patient's pregnancy status was assessed. As patient is a female not of child-bearing potential, education is not applicable.     Medication Administration Assessment  The patient has the ability to self-administer this medication.     Medication Reconciliation  Home Medications    Medication Sig   acyclovir (ZOVIRAX) 400 mg tablet Take one tablet by mouth every 12 hours.   allopurinoL (ZYLOPRIM) 300 mg tablet Begin taking 2-3 days prior to Venetoclax.  Take two tablets by mouth on the first day, then take one tablet by mouth daily.   ALPRAZolam (XANAX) 0.25 mg tablet Take one tablet by mouth twice daily as needed for Sleep or Anxiety.   amLODIPine (NORVASC) 10 mg tablet Take one tablet by mouth daily. Indications: high blood pressure   atorvastatin (LIPITOR) 10 mg tablet TAKE 1 TABLET BY MOUTH ONCE DAILY FOR 90 DAYS   cyanocobalamin (vitamin B-12) 500 mcg tablet Take one tablet by mouth daily.   ergocalciferol (vitamin D2) (VITAMIN D PO) Take 1 tablet by mouth daily.   ferrous sulfate (FEOSOL) 325 mg (65 mg iron) tablet Take one tablet by mouth daily. Take on an empty stomach at least 1 hour before or 2 hours after food.   ondansetron (ZOFRAN ODT) 4 mg rapid dissolve tablet Dissolve one tablet by mouth every 6 hours as needed. Place on tongue to dissolve.  Indications: nausea and vomiting   venetoclax (VENCLEXTA) 10 mg-50 mg- 100 mg 28 day starting pack Starting on Cycle 2 Day 1, take 20mg  by mouth daily for 7 days, 50mg  daily for 7 days, 100mg  daily for 7 days, then 200mg  daily for 7 days as directed with food.       Medication reconciliation is based on the patient?s most recent medication list in the electronic medical record (EMR) including herbal products and OTC medications. The patient's medication list will be updated during patient education, after speaking with the patient and prior to dispensing the medication.     Drug-drug interactions (DDIs)  DDIs were evaluated: No significant drug-drug interactions were identified.     Follow up plan: will discuss with Hospital staff and determine if alternative therapy is appropriate.      Drug-Food Interactions  Drug-food interactions were evaluated: Venetoclax (Venclexta?) should be administered with a meal and water at approximately the same time each day. Swallow whole; do not crush, chew, or break.    Contraindications  The following contraindications to the use of Venetoclax (Venclexta?) were reviewed:  Concomitant use with strong CYP3A inhibitors at initiation and during ramp-up phase.    No contraindications to therapy were identified.     Vaccination Status Assessment   Immunization History   Administered Date(s) Administered    COVID-19 (MODERNA), mRNA vacc, 100 mcg/0.5 mL (PF) 02/23/2020, 03/22/2020, 09/07/2020, 07/02/2021    Covid-19 Bivalent (80yr+)(MODERNA), mRNA Vacc, 50 mcg/0.5 mL (PF) 12/02/2021    Covid-19 mRNA Vaccine >=12yo (Moderna)(2023-24 formula)(Spikevax) 09/15/2022    Flu Vaccine =>65 YO High-Dose (PF) 10/18/2020, 10/10/2021    Flu Vaccine Trivalent =>3 Yo (Preservative Free) 10/12/2013    Influenza Vaccine Whole 09/18/2008       Vaccine history was reviewed. Appropriate recommended vaccinations were reviewed. The patient will be reminded about the importance of receiving an annual influenza vaccine as indicated.    Safety Precautions  The following safety precautions to the use of Venetoclax (Venclexta?) were reviewed:  Bone marrow suppression  Tumor lysis syndrome: especially during initial 5-week dose escalation  Hepatic impairment  Renal impairment: patients with CrCl <80 mL/minute are at increased risk of TLS and may require more intensive TLS prophylaxis and monitoring during treatment initiation and dose escalation.     Safety precautions for this medication have been reviewed. No concerns have been identified. Patient will be on allopurinol starting 2-3 days prior to obinutuzumab and monitored during treatment start and dose escalation.     Risk Evaluation and Mitigation Strategy (REMS) Assessment  No REMS is required for this medication    Initial therapy assessment has been completed and the patient will be contacted to complete education on their regimen.     Alberteen Sam, [facility]  Clinical Pharmacist  04/08/23

## 2023-04-08 NOTE — Discharge Instructions - Pharmacy
Discharge Summary      Name: Lindsey Brown  Medical Record Number: [id number]        Account Number:  [id number]  Date Of Birth:  [birth year]                         Age:  67 y.o.  Admit date:  03/31/2023                     Discharge date: 04/07/2023      Discharge Attending:  Melvyn Novas, MD  Discharge Summary Completed By: Melvyn Novas, MD    Service: Med Private [phone]    Reason for hospitalization:  Acute hypoxic respiratory failure (HCC) [J96.01]    Primary Discharge Diagnosis:   Same as Above    Hospital Diagnoses:  Hospital Problems        Active Problems    * (Principal) Acute hypoxic respiratory failure (HCC)    CLL (chronic lymphocytic leukemia) (HCC)    AKI (acute kidney injury) (HCC)     Significant Past Medical History        Arthritis    Allergies   Pcn [penicillins]    Brief Hospital Course   The patient was admitted and the following issues were addressed during this hospitalization: (with pertinent details including admission exam/imaging/labs).      67 y.o.  female with history of CLL, vitamin B12 deficincy, HTN who presents with shortness of breath and cough and was admitted for acute hypoxic respiratory failure and pneumonia. Pneumonia is likely viral with human meta pneumovirus + superipmposed bacterial. She was treated with a full course of ceftriaxone while inpatient and was able to return to room air.  She was also noted to have an acute kidney injury. We stopped her olmesartan and her BP was managed with amlodipine alone for good read throughout her hospital stay. Nephrology were consulted. She underwent renal bx on 4/15 to rule out CLL involvement and other renal diseases. She will follow with Nephrology as an outpatient to discuss her results.    Items Needing Follow Up   Pending items or areas that need to be addressed at follow up:     Path results from kidney biopsy    Pending Labs and Follow Up Radiology    Pending labs and/or radiology review at this time of discharge are listed below: if this area is blank, there are no items for review.   Pending Labs       Order Current Status    ELECTRON MICROSCOPY, TECH ONLY In process    ELECTROPHORESIS-UR RAN In process    PATHOLOGY/CYTOLOGY REQUEST In process    UA GREY TOP TUBE In process              Medications      Medication List      START taking these medications     amLODIPine 10 mg tablet; Commonly known as: NORVASC; Dose: 10 mg; Take   one tablet by mouth daily. Indications: high blood pressure; For: high   blood pressure; Quantity: 90 tablet; Refills: 1   ondansetron 4 mg rapid dissolve tablet; Commonly known as: ZOFRAN ODT;   Dose: 4 mg; Dissolve one tablet by mouth every 6 hours as needed. Place on   tongue to dissolve.  Indications: nausea and vomiting; For: nausea and   vomiting; Quantity: 30 tablet; Refills: 0     CONTINUE taking these medications  ALPRAZolam 0.25 mg tablet; Commonly known as: XANAX; Dose: 0.25 mg;   Refills: 0   atorvastatin 10 mg tablet; Commonly known as: LIPITOR; Refills: 0   cyanocobalamin (vitamin B-12) 500 mcg tablet; Dose: 500 mcg; Refills: 0   ferrous sulfate 325 mg (65 mg iron) tablet; Commonly known as: FEOSOL;   Dose: 325 mg; Refills: 0   VITAMIN D PO; Dose: 1 tablet; Refills: 0     STOP taking these medications     amLODIPine-olmesartan 10-20 mg tablet; Commonly known as: AZOR   levoFLOXacin 750 mg tablet; Commonly known as: LEVAQUIN       Return Appointments and Scheduled Appointments     Scheduled appointments:      Apr 21, 2023 1:00 PM  Office visit with Aundria Mems, MD  Nephrology: [facility] (Internal Medicine) [address]  [address]  [place] [address]  [phone]   Additional appointment instructions:    Please follow up with your PCP within 1 week. If your BP becomes elevated by then, please discuss starting Carvedilol (Coreg) with him.          Things you need to do       Follow up with Barbaraann Rondo, MD    Phone: [phone]    Where: [address], [place] [address]      Additional Discharge Appointments    Please follow up with your PCP within 1 week. If your BP becomes elevated by then, please discuss starting Carvedilol (Coreg) with him.                  Consults, Procedures, Diagnostics, Micro, Pathology   Consults: Hematology and Nephrology  Surgical Procedures & Dates: None  Significant Diagnostic Studies, Micro and Procedures: noted in brief hospital course  Significant Pathology: none                                   Discharge Disposition, Condition   Patient Disposition: Home or Self Care [01]  Condition at Discharge: Stable    Code Status     Code Status History       Date Active Date Inactive Code Status Order ID          03/31/2023 2004 04/07/2023 1334 Full Code [id number]  Dorann Ou, MD Inpatient    03/31/2023 1859 03/31/2023 2004 Full Code [id number]  Dorann Ou, MD Inpatient            Patient Instructions     Activity       Activity as Tolerated   As directed      It is important to keep increasing your activity level after you leave the hospital.  Moving around can help prevent blood clots, lung infection (pneumonia) and other problems.  Gradually increasing the number of times you are up moving around will help you return to your normal activity level more quickly.  Continue to increase the number of times you are up to the chair and walking daily to return to your normal activity level. Begin to work toward your normal activity level at discharge          Diet       Regular Diet   As directed      You have no dietary restriction. Please continue with a healthy balanced diet.  Discharge education provided to patient., Signs and Symptoms:   Report these signs and symptoms       Report These Signs and Symptoms   As directed      Please contact your doctor if you have any of the following symptoms: temperature higher than 100 degrees F, uncontrolled pain, persistent nausea and/or vomiting, difficulty breathing, chest pain, severe abdominal pain, headache, unable to urinate, unable to have bowel movement or drainage with a foul odor        , Education: , and Others Instructions:   Other Orders       Questions About Your Stay   Complete by: As directed      Discharging attending physician: Melvyn Novas    Order comments: If you have an emergency after discharge, please dial 9-1-1.    You may contact your discharging physician up to 7 days after discharge for questions about your hospitalization, discharge instructions, or medications by calling [phone] during regular business hours (8AM-4PM) and asking to speak to the doctor listed on the discharge information.       If you are not calling during business hours, ask for the on-call doctor.    If you have new  or worsening symptoms, you may be directed to your primary care provider (PCP) for ongoing questions, an Emergency Department, or an Urgent Care Clinic for a more immediate evaluation.    For all calls or questions more than 7 days after discharge, please contact your primary care provider (PCP).    For medications after discharge: pain (opioid) medicine cannot be refilled or prescribed by calling your discharging physician.  These medications need to be filled by your primary care provider (PCP).  Regular refill requests should be directed to your primary care provider (PCP).            Additional Orders: Case Management, Supplies, Home Health     Home Health/DME       None              Signed:  Melvyn Novas, MD  04/08/2023      cc:  Primary Care Physician:  Barbaraann Rondo   Verified    Referring physicians:  Self, Referral   Additional provider(s):        Did we miss something? If additional records are needed, please fax a request on office letterhead to [phone]. Please include the patient's name, date of birth, fax number and type of information needed. Additional request can be made by email at [email or web address]. For general questions of information about electronic records sharing, call [phone].

## 2023-04-08 NOTE — Progress Notes
Oral Chemotherapy Counseling  Venetoclax (Venclexta?)    Lindsey Brown was provided medication education regarding her new oral chemotherapy.     I reviewed the role of  specialty pharmacy, including access to medication assistance specialists if needed.     How to Take the Medication  Lindsey Brown was educated on Venetoclax [facility]?), the indication for treatment, dose, route, frequency and duration of therapy.     Directions:   Week 1: 20 mg by mouth once daily  Week 2: 50 mg by mouth once daily  Week 3: 100 mg by mouth once daily  Week 4: 200 mg by mouth once daily  Week 5 and thereafter: 400 mg by mouth once daily; continue until disease progression or unacceptable toxicity.    The patient was educated on the appropriate hydration and antihyperuricemic therapy based on their TLS risk.   Patient was educated to take with a meal and water at approximately the same time each day and to avoid grapefruit products.   Patient was educated to swallow tablets whole and not to crush, chew or open tablets.       How to Store Medication  Lindsey Brown was educated to store Venetoclax (Venclexta?) at room temperature in a safe place away from humidity, pets, and children.  I recommended storing the medication in a pill box, if needed, as long as the Venetoclax (Venclexta?) was protected from moisture.  I recommended if family members would be handling the medication, they should use gloves.  Additionally, I recommended cleaning any surfaces touched by venetoclax with bleach, if possible.     Adherence  The patient's ability to self-administer medication was assessed.    Patient was educated on the importance of adherence and that the consequences of non-adherence could include disease progression. The patient's ability to be adherent with drug therapies was discussed and the patient was provided options for tools/resources that promote adherence to therapy. For [employer], calendars, pillboxes, and technology (reminder apps) were recommended and/or provided.     How to Manage Missed Doses  If a dose is missed and it is within 8 hours of the usual dosing time, I instructed the patient to administer the missed dose as soon as possible and resume the normal daily dosing schedule. If it is more than 8 hours, do not administer the missed dose and resume the usual dosing schedule the next day. If the patient vomits following administration of a dose, no additional doses should be administered; the next prescribed dose should be taken at the usual time.    Contraindications / Safety Precautions / Adverse Effects  Contraindications to therapy, safety precautions, and common adverse effects (listed below) were discussed with the patient.  I explained that most patients do NOT experience all these side effects and that this list was not inclusive.  I instructed patient to report any adverse effects to their doctor, pharmacist or nurse.     What You May Expect?  What Should You Do?    Tumor lysis syndrome Drink recommended amount of fluids and take allopurinol daily   Decreased blood counts, fevers, chills, infections   Low white blood cell count and low platelets  Your health care providers will test your blood frequently to monitor your blood counts.  Report any signs of infection, fever, and unusual bleeding or bruising.   Phone your doctor right away or go to the nearest emergency department, if your oral temperature is over 38?C or 100.4?F (  unless stated otherwise by your healthcare team). Tell the healthcare team that you are on chemotherapy.  Check your temperature, especially if you are feeling unwell with sweats, fever or chills. Take your temperature before using acetaminophen (Tylenol?), since it may mask fever.  Check with your doctor before getting any vaccines.   Fatigue or tiredness Rest often? Take naps if needed. Move slowly when getting up.  Eat well balanced meals and drink plenty of fluids. Light exercise may help.  Do not drive a motor vehicle or operate machinery when feeling tired.     Patient was instructed to call and seek help immediately if she had:   Signs of an infection such as fever, chills, cough, pain or burning when you urinate.   Signs of bleeding problems such as black tarry stools; blood in urine; pinpoint red or purple spots on skin.   Signs of liver problems such as yellow eyes or skin, white or clay-colored stools.     Patient was instructed to call nurse or doctor if she had:   Severe abdominal or stomach cramping or pain.   No urination or abnormally small amounts of urine    Vaccination Status Education   Appropriate recommended vaccinations were reviewed and discussed with the patient. The patient was also reminded about the importance of receiving an annual influenza vaccine as indicated.    REMS Program:  No REMS is required for this medication.    Drug-Drug Interactions:  A medication history and reconciliation was performed (including prescription medications, supplements, over the counter medications, and herbal products). The medication list was updated and the patient?s current medication list is included below.  I stressed the importance of maintaining an accurate medication list and informing their medical team prior to taking any new medications.     Home Medications    Medication Sig   acyclovir (ZOVIRAX) 400 mg tablet Take one tablet by mouth every 12 hours.   allopurinoL (ZYLOPRIM) 300 mg tablet Begin taking 2-3 days prior to Venetoclax.  Take two tablets by mouth on the first day, then take one tablet by mouth daily.   ALPRAZolam (XANAX) 0.25 mg tablet Take one tablet by mouth twice daily as needed for Sleep or Anxiety.   amLODIPine (NORVASC) 10 mg tablet Take one tablet by mouth daily. Indications: high blood pressure   atorvastatin (LIPITOR) 10 mg tablet TAKE 1 TABLET BY MOUTH ONCE DAILY FOR 90 DAYS   cyanocobalamin (vitamin B-12) 500 mcg tablet Take one tablet by mouth daily.   ergocalciferol (vitamin D2) (VITAMIN D PO) Take 1 tablet by mouth daily.   ferrous sulfate (FEOSOL) 325 mg (65 mg iron) tablet Take one tablet by mouth daily. Take on an empty stomach at least 1 hour before or 2 hours after food.   ondansetron (ZOFRAN ODT) 4 mg rapid dissolve tablet Dissolve one tablet by mouth every 6 hours as needed. Place on tongue to dissolve.  Indications: nausea and vomiting   venetoclax (VENCLEXTA) 10 mg-50 mg- 100 mg 28 day starting pack Starting on Cycle 2 Day 1, take 20mg  by mouth daily for 7 days, 50mg  daily for 7 days, 100mg  daily for 7 days, then 200mg  daily for 7 days as directed with food.       Drug-drug and drug-food interactions with the new therapy were assessed and reviewed with the patient. No significant drug-drug interactions were identified.     Reproductive Concerns  Reproductive concerns were reviewed with the patient. As patient is a female  not of child-bearing potential, education was not applicable.     What to Do With Any Unused or Expired Medications  Appropriate safe handling and disposal procedures were reviewed with the patient. MACAELA PRESAS was instructed to return any unused or expired oral chemotherapy medication to a designated disposal bin at one of the [facility] locations or to utilize a community drug take back program. Instructed not to flush down the toilet or to crush and dispose of medication in the trash.    Monitoring  Monitoring and follow-up plan was discussed with patient. JOANNE SALAH was instructed to contact the oral chemotherapy pharmacist at [phone] if they have any questions or concerns regarding their medication therapy.  Informed the patient that we would send the prescription to a specialty pharmacy and that the pharmacy would be calling the patient to schedule a shipment.  Emphasized if their phone calls were not answered, the specialty pharmacy would not ship the medication.    This medication is considered medium risk per our internal oral chemotherapy risk categorization and the patient will be contacted for education, toxicity check at 2 weeks, one reassessment at 3 months and then annually, if applicable (medium risk monitoring).       Questions  Patient was given the opportunity to ask questions. Patient verbalized understanding, agreed with the plan and had no questions or concerns regarding therapy.     Alberteen Sam, [facility]  Clinical Pharmacist  04/08/23

## 2023-04-08 NOTE — Progress Notes
Name: Lindsey Brown          MRN: [id number]      DOB: [birth year]      AGE: 67 y.o.   DATE OF SERVICE: 04/08/2023    Subjective:             Reason for Visit:  Cancer Follow up      Lindsey Brown is a 67 y.o. female.      Cancer Staging   CLL (chronic lymphocytic leukemia) (HCC)  Staging form: Chronic Lymphocytic Leukemia / Small Lymphocytic Lymphoma, AJCC 8th Edition  - Clinical stage from 02/05/2023: Modified Rai Stage III (Modified Rai risk: High, Lymphocytosis: Present, Adenopathy: Present, Organomegaly: Absent, Anemia: Present, Thrombocytopenia: Absent) - Signed by Violeta Gelinas, MD on 02/05/2023      Lindsey Brown presents today in hematologic consultation for management of CLL at the request of Dr. Angelyn Punt.    She has the following detailed history:  1.  Presented in January 2021 with palpable adenopathy for at least 2 years accompanied by peripheral blood lymphocytosis.  She was referred to Dr. Angelyn Punt.  2.  01/26/2020 bone marrow biopsy showed 60% involvement with CLL.  There was not much surface immunoglobulin, but a suggestion of mild kappa restriction.  CLL FISH panel was unremarkable.  Conventional karyotype was diploid.  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  3.  She developed worsening renal insufficiency and workup that showed no evidence of circulating paraprotein or abnormal free light chain ratios.  Her 24-hour urine revealed kappa free light chain on immunofixation.  Renal biopsy is pending.    Interim History:  Better.  Fevers, cough and generally feeling better.  Denies new or progressive lymphadenopathy, fevers, drenching night sweats, unintentional weight loss.    I have reviewed and updated the past medical, social and family histories in the history section and they are up to date as of this visit.    I have extensively reviewed the laboratory, pathology and radiology, both internal and external, and the key findings are summarized above.           Review of Systems   Constitutional: Positive for fatigue.   Respiratory:  Positive for cough (pneumonia) and shortness of breath.    All other systems reviewed and are negative.        Objective:          ALPRAZolam (XANAX) 0.25 mg tablet Take one tablet by mouth twice daily as needed for Sleep or Anxiety.    amLODIPine (NORVASC) 10 mg tablet Take one tablet by mouth daily. Indications: high blood pressure    atorvastatin (LIPITOR) 10 mg tablet TAKE 1 TABLET BY MOUTH ONCE DAILY FOR 90 DAYS    cyanocobalamin (vitamin B-12) 500 mcg tablet Take one tablet by mouth daily.    ergocalciferol (vitamin D2) (VITAMIN D PO) Take 1 tablet by mouth daily.    ferrous sulfate (FEOSOL) 325 mg (65 mg iron) tablet Take one tablet by mouth daily. Take on an empty stomach at least 1 hour before or 2 hours after food.    ondansetron (ZOFRAN ODT) 4 mg rapid dissolve tablet Dissolve one tablet by mouth every 6 hours as needed. Place on tongue to dissolve.  Indications: nausea and vomiting       Vitals:    04/08/23 1149   BP: 138/67   BP Source: Arm, Left Upper   Pulse: 103   Temp: 36.9 ?C (98.5 ?F)   SpO2: 98%   O2 Device:  None (Room air)   TempSrc: Oral   PainSc: Zero   Weight: 83.1 kg (183 lb 3.2 oz)     Body mass index is 27.86 kg/m?Marland Kitchen     Pain Score: Zero       Fatigue Scale: 6    Pain Addressed:  N/A    Patient Evaluated for a Clinical Trial: No treatment clinical trial available for this patient.     [place] Cooperative Oncology Group performance status is 0, Fully active, able to carry on all pre-disease performance without restriction.     Physical Exam  Vitals and nursing note reviewed.   Constitutional:       General: She is not in acute distress.     Appearance: She is well-developed.   HENT:      Head: Normocephalic.   Eyes:      General: No scleral icterus.     Conjunctiva/sclera: Conjunctivae normal.   Cardiovascular:      Rate and Rhythm: Normal rate and regular rhythm.   Pulmonary:      Effort: Pulmonary effort is normal.      Breath sounds: Normal breath sounds.   Abdominal:      Palpations: Abdomen is soft.   Musculoskeletal:      Cervical back: Neck supple.   Lymphadenopathy:      Comments: No palpable cervical, supraclavicular, axillary or inguinal adenopathy.   Skin:     Findings: No rash.   Neurological:      Mental Status: She is alert.               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  She was treated for this uneventfully and has recovered.  She has completed her planned course of antibiotics.  She was discovered on admission to have significant renal insufficiency.  She was seen by the nephrology team and I greatly appreciate their assistance.  She did undergo a kidney biopsy on Monday and the results of this are pending.  I discussed with her today that regardless of the results of the kidney biopsy, she does have a clear indication for treatment for her CLL.  There is a chance that her renal insufficiency is a paraneoplastic syndrome that would be evident on her biopsy report, in which case sometimes treatment of the CLL resulted in significant improvement in her renal function.  However, the results of the renal biopsy will not influence her decision for treatment.  The additional importance of this was that her renal insufficiency renders her not a candidate for clinical trial therapy.  She should be treated with standard of care.  I recommend venetoclax and obinutuzumab.  We discussed risk and benefits and obtained informed consent today.  Start obinutuzumab with conventional dose escalation next week.  We will plan to initiate this at the [facility] clinic for her convenience.  She will initiate venetoclax with cycle 2-day 1.  Venetoclax starter pack was released to [employer] today.  She got education with our clinical pharmacist.  Start acyclovir 400 mg p.o. twice daily.  This is dose adjusted due to her renal dysfunction.  She will start this 2 to  3 days before her obinutuzumab.  Start allopurinol 300 mg p.o. daily.  She will take this throughout her obinutuzumab and until she has completed her venetoclax uptitration.  She will start this 2 to 3 days prior to her obinutuzumab.  Check peripheral blood FISH, peripheral blood NGS and CloneSeq probes prior to starting treatment.  RTC with APP on day 8 of her obinutuzumab, with me for cycle 2-day 1 for venetoclax initiation, or sooner should new or concerning issues arise.    I have discussed the diagnosis and treatment plan with the patient and she expresses understanding and wishes to proceed.

## 2023-04-09 ENCOUNTER — Encounter: Admit: 2023-04-09 | Discharge: 2023-04-09 | Payer: MEDICARE

## 2023-04-09 DIAGNOSIS — C9110 Chronic lymphocytic leukemia of B-cell type not having achieved remission: Secondary | ICD-10-CM

## 2023-04-09 NOTE — Progress Notes
Pharmacy Benefits Investigation    Medication name: VENETOCLAX PO    The insurance does not require a prior authorization for the medication.    The out of pocket cost is $200.    Lindsey Brown stated the copay is affordable.    Phylliss Blakes  Specialty Pharmacy Patient Advocate

## 2023-04-10 ENCOUNTER — Encounter: Admit: 2023-04-10 | Discharge: 2023-04-10 | Payer: MEDICARE

## 2023-04-11 ENCOUNTER — Encounter: Admit: 2023-04-11 | Discharge: 2023-04-11 | Payer: MEDICARE

## 2023-04-12 ENCOUNTER — Encounter: Admit: 2023-04-12 | Discharge: 2023-04-12 | Payer: MEDICARE

## 2023-04-13 LAB — ELECTROPHORESIS-UR RAN: GAMMA,URINE: 5 %

## 2023-04-14 ENCOUNTER — Encounter: Admit: 2023-04-14 | Discharge: 2023-04-14 | Payer: MEDICARE

## 2023-04-14 LAB — ELECTRON MICROSCOPY, TECH ONLY

## 2023-04-16 ENCOUNTER — Encounter: Admit: 2023-04-16 | Discharge: 2023-04-16 | Payer: MEDICARE

## 2023-04-16 DIAGNOSIS — M1990 Unspecified osteoarthritis, unspecified site: Secondary | ICD-10-CM

## 2023-04-16 DIAGNOSIS — C9110 Chronic lymphocytic leukemia of B-cell type not having achieved remission: Secondary | ICD-10-CM

## 2023-04-16 MED ORDER — FAMOTIDINE (PF) 20 MG/2 ML IV SOLN
20 mg | Freq: Once | INTRAVENOUS | 0 refills | Status: CP
Start: 2023-04-16 — End: ?
  Administered 2023-04-16: 15:00:00 20 mg via INTRAVENOUS

## 2023-04-16 MED ORDER — ACETAMINOPHEN 500 MG PO TAB
1000 mg | Freq: Once | ORAL | 0 refills | Status: CP
Start: 2023-04-16 — End: ?
  Administered 2023-04-16: 15:00:00 1000 mg via ORAL

## 2023-04-16 MED ORDER — MEPERIDINE (PF) 25 MG/ML IJ SYRG
25 mg | INTRAVENOUS | 0 refills | Status: AC | PRN
Start: 2023-04-16 — End: ?

## 2023-04-16 MED ORDER — DIPHENHYDRAMINE HCL 50 MG/ML IJ SOLN
50 mg | Freq: Once | INTRAVENOUS | 0 refills | Status: CP
Start: 2023-04-16 — End: ?
  Administered 2023-04-16: 16:00:00 50 mg via INTRAVENOUS

## 2023-04-16 MED ORDER — MONTELUKAST 10 MG PO TAB
10 mg | Freq: Once | ORAL | 0 refills | Status: CP
Start: 2023-04-16 — End: ?
  Administered 2023-04-16: 15:00:00 10 mg via ORAL

## 2023-04-16 MED ORDER — SODIUM CHLORIDE 0.9 % IV SOLP
500 mL | INTRAVENOUS | 0 refills | Status: CP
Start: 2023-04-16 — End: ?
  Administered 2023-04-16: 16:00:00 500 mL via INTRAVENOUS

## 2023-04-16 MED ORDER — DIPHENHYDRAMINE HCL 50 MG PO CAP
50 mg | Freq: Once | ORAL | 0 refills | Status: CP
Start: 2023-04-16 — End: ?
  Administered 2023-04-16: 15:00:00 50 mg via ORAL

## 2023-04-16 MED ORDER — FAMOTIDINE (PF) 20 MG/2 ML IV SOLN
20 mg | Freq: Once | INTRAVENOUS | 0 refills | Status: CP
Start: 2023-04-16 — End: ?

## 2023-04-16 MED ORDER — ONDANSETRON HCL (PF) 4 MG/2 ML IJ SOLN
8 mg | Freq: Once | INTRAVENOUS | 0 refills | Status: CP
Start: 2023-04-16 — End: ?
  Administered 2023-04-16: 18:00:00 8 mg via INTRAVENOUS

## 2023-04-16 MED ORDER — OBINUTUZUMAB  100MG IVPB (1ST DOSE)
100 mg | Freq: Once | INTRAVENOUS | 0 refills | Status: CP
Start: 2023-04-16 — End: ?
  Administered 2023-04-16 (×2): 100 mg via INTRAVENOUS

## 2023-04-16 MED ORDER — DEXAMETHASONE IVPB
20 mg | Freq: Once | INTRAVENOUS | 0 refills | Status: CP
Start: 2023-04-16 — End: ?
  Administered 2023-04-16 (×2): 20 mg via INTRAVENOUS

## 2023-04-16 MED ADMIN — FAMOTIDINE (PF) 20 MG/2 ML IV SOLN [166077]: 20 mg | INTRAVENOUS | @ 16:00:00 | Stop: 2023-04-16 | NDC 63323073911

## 2023-04-17 ENCOUNTER — Encounter: Admit: 2023-04-17 | Discharge: 2023-04-17 | Payer: MEDICARE

## 2023-04-17 DIAGNOSIS — M1990 Unspecified osteoarthritis, unspecified site: Secondary | ICD-10-CM

## 2023-04-17 DIAGNOSIS — C9110 Chronic lymphocytic leukemia of B-cell type not having achieved remission: Secondary | ICD-10-CM

## 2023-04-17 LAB — PATHOLOGY/CYTOLOGY REQUEST

## 2023-04-17 MED ORDER — MONTELUKAST 10 MG PO TAB
10 mg | Freq: Once | ORAL | 0 refills | Status: CP
Start: 2023-04-17 — End: ?
  Administered 2023-04-17: 14:00:00 10 mg via ORAL

## 2023-04-17 MED ORDER — OBINUTUZUMAB  900MG IVPB (2ND DOSE)
900 mg | Freq: Once | INTRAVENOUS | 0 refills | Status: CP
Start: 2023-04-17 — End: ?
  Administered 2023-04-17 (×2): 900 mg via INTRAVENOUS

## 2023-04-17 MED ORDER — DIPHENHYDRAMINE HCL 50 MG/ML IJ SOLN
50 mg | Freq: Once | INTRAVENOUS | 0 refills | Status: CP
Start: 2023-04-17 — End: ?
  Administered 2023-04-17: 14:00:00 50 mg via INTRAVENOUS

## 2023-04-17 MED ORDER — FAMOTIDINE (PF) 20 MG/2 ML IV SOLN
20 mg | Freq: Once | INTRAVENOUS | 0 refills | Status: CP
Start: 2023-04-17 — End: ?
  Administered 2023-04-17: 14:00:00 20 mg via INTRAVENOUS

## 2023-04-17 MED ORDER — METHYLPREDNISOLONE SOD SUC(PF) 125 MG/2 ML IJ SOLR
125 mg | Freq: Once | INTRAVENOUS | 0 refills | Status: CP
Start: 2023-04-17 — End: ?
  Administered 2023-04-17: 14:00:00 125 mg via INTRAVENOUS

## 2023-04-17 MED ORDER — ACETAMINOPHEN 500 MG PO TAB
1000 mg | Freq: Once | ORAL | 0 refills | Status: CP
Start: 2023-04-17 — End: ?
  Administered 2023-04-17: 14:00:00 1000 mg via ORAL

## 2023-04-17 NOTE — Progress Notes
Cancer Therapeutics Note  Verified consent signed and in chart.    Blood return positive via: Peripheral (22 ga)    BSA and dose double checked (agree with orders as written) with: yes See MAR    Labs/applicable tests checked: None    Treatment regimen: Cycle 1 Day 2    obinutuzumab (GAZYVA) 900 mg in sodium chloride 0.9% (NS) 286 mL IVPB   [[id number]]     Rate verified and armband double check with second RN: yes    Patient presents to infusion center for [facility]. Patient informed of plan of care for today's treatment and all questions were answered.  Treatment administered and tolerated without complication. Patient offered copy of AVS prior to discharge. Patient ambulatory from infusion center in stable condition.

## 2023-04-20 ENCOUNTER — Encounter: Admit: 2023-04-20 | Discharge: 2023-04-20 | Payer: MEDICARE

## 2023-04-20 DIAGNOSIS — C9110 Chronic lymphocytic leukemia of B-cell type not having achieved remission: Secondary | ICD-10-CM

## 2023-04-20 LAB — TOTAL PROTEIN-URINE 24 HR
TOTAL PROTEIN 24HR: 819 mg/(24.h) — ABNORMAL HIGH (ref 50–150)
UR TOTAL PROTEIN,RAN: 443 mg/dL

## 2023-04-20 LAB — COMPREHENSIVE METABOLIC PANEL
ALBUMIN: 3.5 g/dL (ref 3.5–5.0)
ALK PHOSPHATASE: 74 U/L (ref 25–110)
ALT: 48 U/L (ref 7–56)
ANION GAP: 9 (ref 3–12)
AST: 21 U/L (ref 7–40)
BLD UREA NITROGEN: 49 mg/dL — ABNORMAL HIGH (ref 7–25)
CALCIUM: 8.5 mg/dL (ref 8.5–10.6)
CHLORIDE: 110 MMOL/L (ref 98–110)
CO2: 20 MMOL/L — ABNORMAL LOW (ref 21–30)
CREATININE: 2.1 mg/dL — ABNORMAL HIGH (ref 0.4–1.00)
EGFR: 25 mL/min — ABNORMAL LOW (ref >60)
GLUCOSE,PANEL: 137 mg/dL — ABNORMAL HIGH (ref 70–100)
POTASSIUM: 3.6 MMOL/L (ref 3.5–5.1)
SODIUM: 139 MMOL/L (ref 137–147)
TOTAL BILIRUBIN: 0.4 mg/dL (ref 0.3–1.2)
TOTAL PROTEIN: 6.1 g/dL (ref 6.0–8.0)

## 2023-04-20 LAB — CBC AND DIFF
ABSOLUTE BASO COUNT: 0 K/UL (ref 0–0.20)
ABSOLUTE EOS COUNT: 0.1 K/UL (ref 0–0.45)
ABSOLUTE LYMPH COUNT: 0.6 K/UL — ABNORMAL LOW (ref 1.0–4.8)
ABSOLUTE MONO COUNT: 0.3 K/UL (ref 0–0.80)
ABSOLUTE NEUTROPHIL: 2.4 K/UL (ref 1.8–7.0)
BASOPHILS %: 0 % (ref 0–2)
EOSINOPHILS %: 2 % (ref 0–5)
HEMATOCRIT: 31 % — ABNORMAL LOW (ref 36–45)
HEMOGLOBIN: 10 g/dL — ABNORMAL LOW (ref 12.0–15.0)
LYMPHOCYTES %: 18 % — ABNORMAL LOW (ref 24–44)
MCH: 30 pg (ref 26–34)
MCHC: 34 g/dL (ref 32.0–36.0)
MCV: 89 FL (ref 80–100)
MONOCYTES %: 9 % (ref 4–12)
MPV: 7.8 FL (ref 7–11)
NEUTROPHILS %: 71 % (ref 41–77)
PLATELET COUNT: 150 K/UL (ref 150–400)
RBC COUNT: 3.5 M/UL — ABNORMAL LOW (ref 4.0–5.0)
RDW: 14 % (ref 11–15)
WBC COUNT: 3.4 K/UL — ABNORMAL LOW (ref 4.5–11.0)

## 2023-04-20 LAB — URIC ACID: URIC ACID: 4.3 mg/dL (ref 2.0–7.0)

## 2023-04-20 LAB — URINE COLLECTION
COLLECTION PERIOD: 24 — AB (ref <0.15)
VOLUME URINE: 185 mL (ref 0–5)

## 2023-04-20 NOTE — Telephone Encounter
Rec'd VM from patient stating she has an appt. With Dr. Mercie Eon tomorrow 04/21/2023. Completed a 24 hour urine and has concerns that her urine color has changed and has frothiness. States she needs to talk with someone right away.

## 2023-04-20 NOTE — Telephone Encounter
RN returned call- states the 24 hour urine she completed - brown and frothy urine. Advised we don't have complete results yet as some are still in process. Informed that Dr. Mercie Eon will be able to review with her at her appt. Tomorrow. Pt. Appreciative.

## 2023-04-20 NOTE — Telephone Encounter
-----   Message from Juliette Mangle, RN sent at 04/20/2023 10:10 AM CDT -----  Regarding: FW: Urine sample  Contact: [phone]    ----- Message -----  From: [name]  Sent: 04/20/2023  10:05 AM CDT  To: [facility]  Subject: Urine sample                                     I never heard back about the 24 hr urine collection we talked about at last visit. In the last 24hrs there has been a marked change in my urine. Brown , red brown and very frothy. I collected 24hr specimen and am returning it to lab today and want to make sure correct test are donee with it. Very concerned. Would appreciate prompt response

## 2023-04-21 ENCOUNTER — Encounter: Admit: 2023-04-21 | Discharge: 2023-04-21 | Payer: MEDICARE

## 2023-04-21 ENCOUNTER — Ambulatory Visit: Admit: 2023-04-21 | Discharge: 2023-04-21 | Payer: MEDICARE

## 2023-04-21 DIAGNOSIS — M1990 Unspecified osteoarthritis, unspecified site: Secondary | ICD-10-CM

## 2023-04-21 NOTE — Patient Instructions
Please do not hesitate to call with any questions.  My nurse Hannah can be reached [phone].       Return to clinic in 3 months

## 2023-04-22 ENCOUNTER — Encounter: Admit: 2023-04-22 | Discharge: 2023-04-22 | Payer: MEDICARE

## 2023-04-22 DIAGNOSIS — C9110 Chronic lymphocytic leukemia of B-cell type not having achieved remission: Secondary | ICD-10-CM

## 2023-04-22 DIAGNOSIS — M1990 Unspecified osteoarthritis, unspecified site: Secondary | ICD-10-CM

## 2023-04-22 LAB — COMPREHENSIVE METABOLIC PANEL
ALBUMIN: 3.2 g/dL — ABNORMAL LOW (ref 3.5–5.0)
BLD UREA NITROGEN: 38 mg/dL — ABNORMAL HIGH (ref 7–25)
CALCIUM: 8.4 mg/dL — ABNORMAL LOW (ref 8.5–10.6)
CHLORIDE: 108 MMOL/L (ref 98–110)
CREATININE: 2.1 mg/dL — ABNORMAL HIGH (ref 0.4–1.00)
GLUCOSE,PANEL: 102 mg/dL — ABNORMAL HIGH (ref 70–100)
POTASSIUM: 4 MMOL/L (ref 3.5–5.1)
SODIUM: 138 MMOL/L (ref 137–147)
TOTAL BILIRUBIN: 0.5 mg/dL (ref 0.3–1.2)
TOTAL PROTEIN: 5.5 g/dL — ABNORMAL LOW (ref 6.0–8.0)

## 2023-04-22 LAB — CBC AND DIFF
HEMATOCRIT: 30 % — ABNORMAL LOW (ref 36–45)
HEMOGLOBIN: 10 g/dL — ABNORMAL LOW (ref 12.0–15.0)
MCH: 30 pg — ABNORMAL LOW (ref >60)
MCHC: 34 g/dL (ref 32.0–36.0)
MCV: 89 FL (ref 80–100)
RBC COUNT: 3.3 M/UL — ABNORMAL LOW (ref 4.0–5.0)
WBC COUNT: 3.4 K/UL — ABNORMAL LOW (ref 4.5–11.0)

## 2023-04-22 MED ORDER — OBINUTUZUMAB 1000MG IVPB (MAINT DOSE)
1000 mg | Freq: Once | INTRAVENOUS | 0 refills | Status: CP
Start: 2023-04-22 — End: ?
  Administered 2023-04-22 (×2): 1000 mg via INTRAVENOUS

## 2023-04-22 MED ORDER — DEXAMETHASONE IVPB
20 mg | Freq: Once | INTRAVENOUS | 0 refills | Status: CP
Start: 2023-04-22 — End: ?
  Administered 2023-04-22 (×2): 20 mg via INTRAVENOUS

## 2023-04-22 MED ORDER — DIPHENHYDRAMINE HCL 50 MG PO CAP
50 mg | Freq: Once | ORAL | 0 refills | Status: CP
Start: 2023-04-22 — End: ?
  Administered 2023-04-22: 15:00:00 50 mg via ORAL

## 2023-04-22 MED ORDER — FAMOTIDINE (PF) 20 MG/2 ML IV SOLN
20 mg | Freq: Once | INTRAVENOUS | 0 refills | Status: CP
Start: 2023-04-22 — End: ?
  Administered 2023-04-22: 15:00:00 20 mg via INTRAVENOUS

## 2023-04-22 MED ORDER — ACETAMINOPHEN 500 MG PO TAB
1000 mg | Freq: Once | ORAL | 0 refills | Status: CP
Start: 2023-04-22 — End: ?
  Administered 2023-04-22: 15:00:00 1000 mg via ORAL

## 2023-04-22 NOTE — Progress Notes
Patient arrived to CC treatment for Cycle 1 Day 8 Gazyva. Patient was seen in treatment today buy Kavin Leech; please refer to clinic note for assessment details.    Gazyva given w/o complications, patient tolerated well. No hypersensitivity reaction with today's dose. PIV positive for blood return, flushed with saline, and removed per protocol. Patient declined copy of labs and AVS. All questions and concerns addressed. Patient left CC treatment in stable condition.    Cancer Therapeutics Note  Verified consent signed and in chart.    Blood return positive via: Peripheral (24 ga)    BSA and dose double checked (agree with orders as written) with: yes, see MAR    Labs/applicable tests checked: CBC and Comprehensive Metabolic Panel (CMP)    Treatment regimen: C1D8    obinutuzumab (GAZYVA) 1,000 mg in sodium chloride 0.9% (NS) 290 mL IVPB      Rate verified and armband double check with second RN: yes    Patient education offered and stated understanding. Denies questions at this time.

## 2023-04-27 ENCOUNTER — Encounter: Admit: 2023-04-27 | Discharge: 2023-04-27 | Payer: MEDICARE

## 2023-04-27 NOTE — Progress Notes
Pharmacy Benefits Investigation    Medication name: VENETOCLAX PO    The insurance requires a prior authorization for the medication. The prior authorization was submitted via CoverMyMeds. Will follow up in 2 business days.    PA number: [id number]    Phylliss Blakes  Specialty Pharmacy Patient Advocate

## 2023-04-27 NOTE — Progress Notes
Pharmacy Benefits Investigation    Medication name: VENETOCLAX PO    The prior authorization was approved for Lindsey Brown from 12/22/2022 through 12/22/2023.    The out of pocket cost is $200.    [name] stated the copay is affordable. Per patient's request, the medication will be shipped to the patient's address.    Phylliss Blakes  Specialty Pharmacy Patient Advocate

## 2023-04-28 ENCOUNTER — Encounter: Admit: 2023-04-28 | Discharge: 2023-04-28 | Payer: MEDICARE

## 2023-04-28 DIAGNOSIS — C9110 Chronic lymphocytic leukemia of B-cell type not having achieved remission: Secondary | ICD-10-CM

## 2023-04-28 DIAGNOSIS — M1990 Unspecified osteoarthritis, unspecified site: Secondary | ICD-10-CM

## 2023-04-28 LAB — CBC AND DIFF
ABSOLUTE MONO COUNT: 0.3 K/UL (ref 0–0.80)
ABSOLUTE NEUTROPHIL: 5.2 K/UL (ref 1.8–7.0)
HEMATOCRIT: 30 % — ABNORMAL LOW (ref 36–45)
HEMOGLOBIN: 10 g/dL — ABNORMAL LOW (ref 12.0–15.0)
MCH: 31 pg — ABNORMAL LOW (ref 26–34)
MCHC: 34 g/dL — ABNORMAL LOW (ref 32.0–36.0)
MCV: 91 FL — ABNORMAL HIGH (ref 80–100)
PLATELET COUNT: 151 K/UL — ABNORMAL LOW (ref 150–400)
RBC COUNT: 3.3 M/UL — ABNORMAL LOW (ref 4.0–5.0)
RDW: 14 % (ref 11–15)
WBC COUNT: 6.4 K/UL (ref 4.5–11.0)

## 2023-04-28 LAB — COMPREHENSIVE METABOLIC PANEL
AST: 12 U/L — ABNORMAL LOW (ref 7–40)
SODIUM: 140 MMOL/L (ref 137–147)

## 2023-04-28 MED ORDER — DEXAMETHASONE IVPB
20 mg | Freq: Once | INTRAVENOUS | 0 refills | Status: CP
Start: 2023-04-28 — End: ?
  Administered 2023-04-28 (×2): 20 mg via INTRAVENOUS

## 2023-04-28 MED ORDER — DIPHENHYDRAMINE HCL 50 MG PO CAP
50 mg | Freq: Once | ORAL | 0 refills | Status: CP
Start: 2023-04-28 — End: ?
  Administered 2023-04-28: 14:00:00 50 mg via ORAL

## 2023-04-28 MED ORDER — OBINUTUZUMAB 1000MG IVPB (MAINT DOSE)
1000 mg | Freq: Once | INTRAVENOUS | 0 refills | Status: CP
Start: 2023-04-28 — End: ?
  Administered 2023-04-28 (×2): 1000 mg via INTRAVENOUS

## 2023-04-28 MED ORDER — ACETAMINOPHEN 500 MG PO TAB
1000 mg | Freq: Once | ORAL | 0 refills | Status: CP
Start: 2023-04-28 — End: ?
  Administered 2023-04-28: 14:00:00 1000 mg via ORAL

## 2023-04-28 NOTE — Patient Instructions
Call Immediately to report the following:  Unexplained bleeding or bleeding that will not stop  Difficulty swallowing  Shortness of breath, wheezing, or trouble breathing  Rapid, irregular heartbeat; chest pain  Dizziness, lightheadedness  Rash or cut that swells or turns red, feels hot or painful, or begin to ooze  Diarrhea   Uncontrolled nausea or vomiting  Fever of 100.4 F or higher, or chills    Important Phone Numbers:  Cancer Center Main Number (answered 24 hours a day) [phone]  Cancer Center Scheduling (appointments) [phone]  Social Worker [phone]  Nutritionist [phone]

## 2023-04-28 NOTE — Progress Notes
CHEMO NOTE  Verified chemo consent signed and in chart.    Blood return positive via: Peripheral (22 ga)    BSA and dose double checked (agree with orders as written) with: yes     Labs/applicable tests checked: CBC and Comprehensive Metabolic Panel (CMP)    Chemo regimen: Drug/cycle/day Shelly Bombard IV C1 D15    Rate verified and armband double check with second RN: yes    Patient education offered and stated understanding. Denies questions at this time.    Chemo consent signed on 04/08/23.  Pt seen in exam by Dr. Paulene Floor, no new questions or concerns at this time.  Pt tolerated gazyva infusion without incident.  Pt denies need for AVS, left ambulatory at 1228.

## 2023-04-29 ENCOUNTER — Encounter: Admit: 2023-04-29 | Discharge: 2023-04-29 | Payer: MEDICARE

## 2023-04-29 DIAGNOSIS — C9110 Chronic lymphocytic leukemia of B-cell type not having achieved remission: Secondary | ICD-10-CM

## 2023-04-30 ENCOUNTER — Encounter: Admit: 2023-04-30 | Discharge: 2023-05-03 | Payer: MEDICARE

## 2023-05-03 DIAGNOSIS — C9110 Chronic lymphocytic leukemia of B-cell type not having achieved remission: Secondary | ICD-10-CM

## 2023-05-05 ENCOUNTER — Encounter: Admit: 2023-05-05 | Discharge: 2023-05-05 | Payer: MEDICARE

## 2023-05-07 ENCOUNTER — Encounter: Admit: 2023-05-07 | Discharge: 2023-05-07 | Payer: MEDICARE

## 2023-05-08 ENCOUNTER — Encounter: Admit: 2023-05-08 | Discharge: 2023-05-08 | Payer: MEDICARE

## 2023-05-08 DIAGNOSIS — R509 Fever, unspecified: Secondary | ICD-10-CM

## 2023-05-08 DIAGNOSIS — K5902 Outlet dysfunction constipation: Secondary | ICD-10-CM

## 2023-05-08 DIAGNOSIS — C9110 Chronic lymphocytic leukemia of B-cell type not having achieved remission: Secondary | ICD-10-CM

## 2023-05-08 DIAGNOSIS — M1990 Unspecified osteoarthritis, unspecified site: Secondary | ICD-10-CM

## 2023-05-08 LAB — CBC AND DIFF
ABSOLUTE BASO COUNT: 0.1 K/UL (ref 0–0.20)
ABSOLUTE EOS COUNT: 0.2 K/UL (ref 0–0.45)
ABSOLUTE LYMPH COUNT: 0.7 K/UL — ABNORMAL LOW (ref 1.0–4.8)
ABSOLUTE MONO COUNT: 0.4 K/UL (ref 0–0.80)
ABSOLUTE NEUTROPHIL: 3.7 K/UL (ref 1.8–7.0)
BASOPHILS %: 3 % — ABNORMAL HIGH (ref 0–2)
EOSINOPHILS %: 4 % — ABNORMAL LOW (ref >60)
HEMATOCRIT: 26 % — ABNORMAL LOW (ref 36–45)
HEMOGLOBIN: 9.2 g/dL — ABNORMAL LOW (ref 12.0–15.0)
LYMPHOCYTES %: 13 % — ABNORMAL LOW (ref 24–44)
MCH: 31 pg (ref 26–34)
MCHC: 34 g/dL (ref 32.0–36.0)
MCV: 89 FL — ABNORMAL LOW (ref 80–100)
MONOCYTES %: 8 % — ABNORMAL HIGH (ref 4–12)
MPV: 7.6 FL — ABNORMAL LOW (ref 7–11)
NEUTROPHILS %: 72 % (ref 41–77)
PLATELET COUNT: 168 K/UL (ref 150–400)
RBC COUNT: 2.9 M/UL — ABNORMAL LOW (ref 4.0–5.0)
RDW: 14 % — ABNORMAL LOW (ref >60)
WBC COUNT: 5.1 K/UL (ref 4.5–11.0)

## 2023-05-08 LAB — COMPREHENSIVE METABOLIC PANEL
POTASSIUM: 3.7 MMOL/L (ref 3.5–5.1)
SODIUM: 139 MMOL/L (ref 137–147)

## 2023-05-08 NOTE — Telephone Encounter
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  She has no lines in place  No soa, no cough, no resp sxs  No urine sxs    She will try some tylenol and symptom monitor overnight.  If any worsening will call back and plan will be to direct admit    Otherwise she will contact clinic in AM and I would suggest she come in for visit to get exam and some lab and see if any further action regarding fever is appropriate.    Left message on clinic VM.

## 2023-05-08 NOTE — Telephone Encounter
Called Marcelino Duster to FU on message sent from on call Physician this am, Temp 101. Denies s/s of infection. Ongoing hemorrhoids/constipation. Advised to take Tylenol and monitor.  C1D15 obinutuzumab 05/07.     She reports that she feels flu like symptoms, lethargy, body aches, fever 101.9 and 102.4 early morning. She is currently afebrile after taking Tylenol at 5 am.  She verbalized concern for colon cancer because of constipation and blood in stools. We reviewed that constipation can exacerbate her hemorrhoids. She reports that constipation is resolved after taking stool softeners, suppositories, and an enema. Also moving forward a bowel regimen to avoid constipation; stool softeners at night, Miralax daily, plenty of fluids and high fiber diet.  Offered an appointment for labs and APRN visit.

## 2023-05-11 ENCOUNTER — Encounter: Admit: 2023-05-11 | Discharge: 2023-05-11 | Payer: MEDICARE

## 2023-05-12 ENCOUNTER — Encounter: Admit: 2023-05-12 | Discharge: 2023-05-12 | Payer: MEDICARE

## 2023-05-12 DIAGNOSIS — C9110 Chronic lymphocytic leukemia of B-cell type not having achieved remission: Secondary | ICD-10-CM

## 2023-05-12 LAB — COMPREHENSIVE METABOLIC PANEL
ALBUMIN: 3.1 g/dL — ABNORMAL LOW (ref 3.5–5.0)
ALBUMIN: 3.3 g/dL — ABNORMAL LOW (ref 3.5–5.0)
ALK PHOSPHATASE: 92 U/L (ref 25–110)
ALK PHOSPHATASE: 105 U/L (ref 25–110)
ALT: 24 U/L (ref 7–56)
ALT: 26 U/L (ref 7–56)
ANION GAP: 11 (ref 3–12)
ANION GAP: 10 (ref 3–12)
AST: 15 U/L (ref 7–40)
AST: 17 U/L (ref 7–40)
CALCIUM: 8.8 mg/dL (ref 8.5–10.6)
CO2: 24 MMOL/L (ref 21–30)
CO2: 22 MMOL/L (ref 21–30)
CREATININE: 2.1 mg/dL — ABNORMAL HIGH (ref 0.4–1.00)
EGFR: 25 mL/min — ABNORMAL LOW (ref >60)
EGFR: 25 mL/min — ABNORMAL LOW (ref >60)
GLUCOSE,PANEL: 136 mg/dL — ABNORMAL HIGH (ref 70–100)
POTASSIUM: 3.3 MMOL/L — ABNORMAL LOW (ref 3.5–5.1)
POTASSIUM: 4 MMOL/L (ref 3.5–5.1)
SODIUM: 141 MMOL/L (ref 137–147)
SODIUM: 136 MMOL/L — ABNORMAL LOW (ref 137–147)
TOTAL BILIRUBIN: 0.5 mg/dL (ref 0.2–1.3)
TOTAL BILIRUBIN: 0.4 mg/dL (ref 0.2–1.3)
TOTAL PROTEIN: 5.9 g/dL — ABNORMAL LOW (ref 6.0–8.0)
TOTAL PROTEIN: 6.2 g/dL (ref 6.0–8.0)

## 2023-05-12 LAB — PHOSPHORUS
PHOSPHORUS: 4.2 mg/dL (ref 2.0–4.5)
PHOSPHORUS: 3.3 mg/dL (ref 2.0–4.5)

## 2023-05-12 LAB — LDH-LACTATE DEHYDROGENASE: LDH: 191 U/L (ref 100–210)

## 2023-05-12 LAB — CBC AND DIFF
ABSOLUTE BASO COUNT: 0.1 K/UL (ref 0–0.20)
ABSOLUTE EOS COUNT: 0.3 K/UL (ref 0–0.45)
ABSOLUTE LYMPH COUNT: 0.6 K/UL — ABNORMAL LOW (ref 1.0–4.8)
ABSOLUTE MONO COUNT: 0.3 K/UL (ref 0–0.80)
ABSOLUTE NEUTROPHIL: 3 K/UL (ref 1.8–7.0)
BASOPHILS %: 3 % — ABNORMAL HIGH (ref 0–2)
EOSINOPHILS %: 8 % — ABNORMAL HIGH (ref 0–5)
HEMATOCRIT: 22 % — ABNORMAL LOW (ref 36–45)
HEMOGLOBIN: 7.8 g/dL — ABNORMAL LOW (ref 12.0–15.0)
LYMPHOCYTES %: 14 % — ABNORMAL LOW (ref 24–44)
MCH: 31 pg (ref 26–34)
MCHC: 35 g/dL (ref 32.0–36.0)
MCV: 89 FL (ref 80–100)
MONOCYTES %: 8 % (ref 4–12)
MPV: 7.4 FL (ref 7–11)
NEUTROPHILS %: 67 % (ref 41–77)
PLATELET COUNT: 276 K/UL (ref 150–400)
RBC COUNT: 2.4 M/UL — ABNORMAL LOW (ref 4.0–5.0)
RDW: 14 % (ref 11–15)
WBC COUNT: 4.4 K/UL — ABNORMAL LOW (ref 4.5–11.0)

## 2023-05-12 LAB — URIC ACID
URIC ACID: 3.6 mg/dL (ref 2.0–7.0)
URIC ACID: 3.6 mg/dL (ref 2.0–7.0)

## 2023-05-12 LAB — MAGNESIUM: MAGNESIUM: 2.1 mg/dL (ref 1.6–2.6)

## 2023-05-12 MED ORDER — SODIUM CHLORIDE 0.9 % IV SOLP
Freq: Once | INTRAVENOUS | 0 refills | Status: CP
Start: 2023-05-12 — End: ?
  Administered 2023-05-12: 13:00:00 1000.000 mL via INTRAVENOUS

## 2023-05-12 MED ORDER — OBINUTUZUMAB 1000MG IVPB (MAINT DOSE)
1000 mg | Freq: Once | INTRAVENOUS | 0 refills | Status: CP
Start: 2023-05-12 — End: ?
  Administered 2023-05-12 (×2): 1000 mg via INTRAVENOUS

## 2023-05-12 MED ORDER — DIPHENHYDRAMINE HCL 50 MG PO CAP
50 mg | Freq: Once | ORAL | 0 refills | Status: CP
Start: 2023-05-12 — End: ?
  Administered 2023-05-12: 13:00:00 50 mg via ORAL

## 2023-05-12 MED ORDER — DEXAMETHASONE IVPB
20 mg | Freq: Once | INTRAVENOUS | 0 refills | Status: CP
Start: 2023-05-12 — End: ?
  Administered 2023-05-12 (×2): 20 mg via INTRAVENOUS

## 2023-05-12 MED ORDER — ACETAMINOPHEN 500 MG PO TAB
1000 mg | Freq: Once | ORAL | 0 refills | Status: CP
Start: 2023-05-12 — End: ?
  Administered 2023-05-12: 13:00:00 1000 mg via ORAL

## 2023-05-12 NOTE — Progress Notes
Name: Lindsey Brown          MRN: [id number]      DOB: 06-Jul-1956      AGE: 67 y.o.   DATE OF SERVICE: 05/12/2023    Subjective:               Reason for Visit: Follow-up    Lindsey Brown is a 67 y.o. female.      Cancer Staging   CLL (chronic lymphocytic leukemia) (HCC)  Staging form: Chronic Lymphocytic Leukemia / Small Lymphocytic Lymphoma, AJCC 8th Edition  - Clinical stage from 02/05/2023: Modified Rai Stage III (Modified Rai risk: High, Lymphocytosis: Present, Adenopathy: Present, Organomegaly: Absent, Anemia: Present, Thrombocytopenia: Absent) - Signed by Violeta Gelinas, MD on 02/05/2023      Lindsey Brown presents today in hematologic consultation for management of CLL at the request of Dr. Angelyn Punt.    She has the following detailed history:  1.  Presented in January 2021 with palpable adenopathy for at least 2 years accompanied by peripheral blood lymphocytosis.  She was referred to Dr. Angelyn Punt.  2.  01/26/2020 bone marrow biopsy showed 60% involvement with CLL.  There was not much surface immunoglobulin, but a suggestion of mild kappa restriction.  CLL FISH panel was unremarkable.  Conventional karyotype was diploid.  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  3.  She developed worsening renal insufficiency and workup that showed no evidence of circulating paraprotein or abnormal free light chain ratios.  Her 24-hour urine revealed kappa free light chain on immunofixation.  Renal biopsy is pending.  4. Obinutuzumab will be initiated on 04/16/2023 with plans to initiate venetoclax in May 2024.      [place] in [place] Married to husband, [name].    Interim History 05/12/2023:    Javia returns today with her husband, [name], to begin Cycle 2 Day 1 obinutuzumab.   She had a fever last week with concurrent constipation and hemorrhoidal bleeding for one week.  Evaluated by Wyatt Mage, NP on 05/08/23 with stat add-on abdominal CT without concerning findings.  Took Miralax on Friday and Satuday with large BM on Sunday.   Stools are now loose, but not watery. Has also had hemorrhoidal/rectal bleeding for the past week (last episode on 05/10/23). Taking Metamucil once daily.  Has more fatigue.     Left neck nodes have resolved.  Taking allopurinol and acyclovir.   No interim fevers or infections.       Review of Systems   Constitutional:  Positive for fatigue. Negative for activity change, appetite change, chills, diaphoresis, fever and unexpected weight change.   Respiratory:  Negative for cough and shortness of breath.    Gastrointestinal:  Positive for anal bleeding, blood in stool and constipation. Negative for abdominal distention, abdominal pain and rectal pain.   Genitourinary:  Negative for flank pain and hematuria.   Musculoskeletal:  Positive for arthralgias and myalgias.   Neurological:  Positive for weakness. Negative for dizziness, light-headedness, numbness and headaches.   Hematological:  Negative for adenopathy. Does not bruise/bleed easily.   All other systems reviewed and are negative.    Objective:          acyclovir (ZOVIRAX) 400 mg tablet Take one tablet by mouth every 12 hours.    allopurinoL (ZYLOPRIM) 300 mg tablet Begin taking 2-3 days prior to Venetoclax.  Take two tablets by mouth on the first day, then take one tablet by mouth daily.    ALPRAZolam (XANAX) 0.25 mg tablet  Take one tablet by mouth twice daily as needed for Sleep or Anxiety.    amLODIPine (NORVASC) 10 mg tablet Take one tablet by mouth daily. Indications: high blood pressure    atorvastatin (LIPITOR) 10 mg tablet TAKE 1 TABLET BY MOUTH ONCE DAILY FOR 90 DAYS    cyanocobalamin (vitamin B-12) 500 mcg tablet Take one tablet by mouth daily.    ergocalciferol (vitamin D2) (VITAMIN D PO) Take 1 tablet by mouth daily.    ondansetron (ZOFRAN ODT) 4 mg rapid dissolve tablet Dissolve one tablet by mouth every 6 hours as needed. Place on tongue to dissolve.  Indications: nausea and vomiting    venetoclax (VENCLEXTA) 10 mg-50 mg- 100 mg 28 day starting pack Starting on Cycle 2 Day 1, take 20mg  by mouth daily for 7 days, 50mg  daily for 7 days, 100mg  daily for 7 days, then 200mg  daily for 7 days as directed with food.       There were no vitals filed for this visit.    There is no height or weight on file to calculate BMI.             Fatigue Scale: 0-None    Pain Addressed:  N/A    Patient Evaluated for a Clinical Trial: No treatment clinical trial available for this patient.     [place] Cooperative Oncology Group performance status is 0, Fully active, able to carry on all pre-disease performance without restriction.     Physical Exam  Vitals and nursing note reviewed.   Constitutional:       General: She is not in acute distress.     Appearance: Normal appearance. She is well-developed. She is not ill-appearing.   HENT:      Head: Normocephalic.      Nose: Nose normal. No congestion.      Mouth/Throat:      Pharynx: Oropharynx is clear. No oropharyngeal exudate or posterior oropharyngeal erythema.   Eyes:      General: No scleral icterus.     Conjunctiva/sclera: Conjunctivae normal.   Cardiovascular:      Rate and Rhythm: Normal rate and regular rhythm.      Pulses: Normal pulses.      Heart sounds: Normal heart sounds. No murmur heard.  Pulmonary:      Effort: Pulmonary effort is normal. No respiratory distress.      Breath sounds: Normal breath sounds. No decreased breath sounds.   Abdominal:      Palpations: Abdomen is soft. There is no hepatomegaly or splenomegaly.      Tenderness: There is no abdominal tenderness.   Musculoskeletal:         General: Normal range of motion.      Cervical back: Neck supple.      Right lower leg: 2+ Edema present.      Left lower leg: 2+ Edema present.   Lymphadenopathy:      Head:      Right side of head: No submental, submandibular, tonsillar, preauricular, posterior auricular or occipital adenopathy.      Left side of head: No submental, submandibular, tonsillar, preauricular, posterior auricular or occipital adenopathy.      Cervical: No cervical adenopathy.      Right cervical: No superficial cervical adenopathy.     Upper Body:      Right upper body: No supraclavicular or axillary adenopathy.      Left upper body: No supraclavicular or axillary adenopathy.      Lower Body: No  right inguinal adenopathy. No left inguinal adenopathy.      Comments:   Resolution of left cervical node.  No other palpable lymphadenopathy    Skin:     General: Skin is warm and dry.      Capillary Refill: Capillary refill takes less than 2 seconds.      Coloration: Skin is not jaundiced or pale.      Findings: No rash.   Neurological:      Mental Status: She is alert and oriented to person, place, and time. Mental status is at baseline.   Psychiatric:         Mood and Affect: Mood normal.         Behavior: Behavior normal.               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 She is afebrile today with significant improvement of her constipation and hemorrhoidal bleeding. Abdominal CT obtained on 05/08/2023 was personally reviewed without signs of obstruction, colitis, or new lymphadenopathy.   Discussed that she maintain a strict bowel regimen. Recommend metamucil twice daily and daily Senokot-S 1 tablet 1-2 times daily. Notify clinic immediately if she has recurrent rectal bleeding and/or rectal pain.  She has progression of her moderate anemia, likely related to blood loss. Hemoglobin is 7.9 g/dL today on 08/19/5620. Will repeat CBCD when she returns tomorrow, 05/12/2023 for TLS monitoring. Transfuse 1 unit of PRBCs if Hgb is < 7.1 g/dL.   She has ongoing resolution of her lymphocytosis with ALC of 600 today on 05/12/2023 and resolution of her lymphadenopathy.    CMP shows mild hypokalemia with K+3.3. Creatinine is stable at 2.13. Due to initiation of venetoclax and potential for hyperkalemia with TLS, will hold off on giving supplemental potassium today. Advised increase in dietary potassium at this time and will continue to monitor.  Start venetoclax with weekly titration, per protocol. She will begin 20 mg today on 05/12/2023 with first dose taken at 0945.   Will monitor for TLS with repeat labs this afternoon and tomorrow morning, again with 50 mg dose, then weekly thereafter until she reaches 400 mg dose (maximum dose).    Continue allopurinol 300 mg p.o. daily with increased hydration for TLS prophylaxis. She will take this until she has completed her venetoclax uptitration.   Continue obinutuzumab 1,000 mg IV with C2 D1 = 05/12/2023.  Continue acyclovir 400 mg p.o. twice daily. This is dose adjusted due to her renal dysfunction.   Continue to follow with Dr. 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 Anticipate lower extremity edema and renal function will improve with initiation of therapy.     Follow-up: RTC with Dr. Paulene Floor in 1 week, or sooner should new or concerning issues arise.    Patient agrees with plan of care and verbalizes understanding. Questions and concerns were addressed to patient's satisfaction. Our direct clinic number and 24-hour [facility] number provided to patient on after visit summary.     Kavin Leech, APRN-NP  Nurse Practitioner with Dr. Olene Craven and Dr. Lelon Frohlich  Division  of Hematology and Oncology  Pager [phone] or available on Voalte

## 2023-05-12 NOTE — Progress Notes
Patient arrived to CC treatment where she was seen by Kavin Leech, APRN, in treatment. Premeds given per treatment plan. Gazyva given w/o complications, patient tolerated well.  PIV positive for blood return, flushed with saline, and left accessed per protocol for afternoon lab draw. Patient declined copy of labs and AVS. All questions and concerns addressed. Patient left CC treatment w/ all personal belongings in stable condition.    Cancer Therapeutics Note  Verified consent signed and in chart.    Blood return positive via: Peripheral (22 ga)    BSA and dose double checked (agree with orders as written) with: yes, see MAR.    Labs/applicable tests checked: CBC and Comprehensive Metabolic Panel (CMP)    Treatment regimen: Drug/cycle/day: Ferdinand Cava    obinutuzumab (GAZYVA) 1,000 mg in sodium chloride 0.9% (NS) 290 mL IVPB     Rate verified and armband double check with second RN: yes    Patient education offered and stated understanding. Denies questions at this time.

## 2023-05-13 ENCOUNTER — Encounter: Admit: 2023-05-13 | Discharge: 2023-05-13 | Payer: MEDICARE

## 2023-05-13 DIAGNOSIS — C9110 Chronic lymphocytic leukemia of B-cell type not having achieved remission: Secondary | ICD-10-CM

## 2023-05-13 LAB — CBC AND DIFF
ABSOLUTE NEUTROPHIL: 10 K/UL — ABNORMAL HIGH (ref 1.8–7.0)
BASOPHILS %: 0 % (ref 0–2)
EOSINOPHILS %: 0 % — ABNORMAL LOW (ref >60)
HEMATOCRIT: 23 % — ABNORMAL LOW (ref 36–45)
LYMPHOCYTES %: 3 % — ABNORMAL LOW (ref 24–44)
MCH: 30 pg (ref 26–34)
MONOCYTES %: 4 % (ref 4–12)
MPV: 7.2 FL (ref 7–11)
NEUTROPHILS %: 93 % — ABNORMAL HIGH (ref 41–77)
PLATELET COUNT: 325 K/UL (ref 150–400)
RBC COUNT: 2.7 M/UL — ABNORMAL LOW (ref 4.0–5.0)
RDW: 14 % — ABNORMAL LOW (ref 11–15)
WBC COUNT: 11 K/UL — ABNORMAL HIGH (ref 4.5–11.0)

## 2023-05-13 LAB — URIC ACID: URIC ACID: 3.8 mg/dL (ref 2.0–7.0)

## 2023-05-13 LAB — PHOSPHORUS: PHOSPHORUS: 3.6 mg/dL (ref 2.0–4.5)

## 2023-05-13 LAB — LDH-LACTATE DEHYDROGENASE: LDH: 180 U/L — ABNORMAL LOW (ref 100–210)

## 2023-05-13 LAB — COMPREHENSIVE METABOLIC PANEL
POTASSIUM: 3.7 MMOL/L (ref 3.5–5.1)
SODIUM: 138 MMOL/L (ref 137–147)

## 2023-05-18 ENCOUNTER — Encounter: Admit: 2023-05-18 | Discharge: 2023-05-18 | Payer: MEDICARE

## 2023-05-18 DIAGNOSIS — C9110 Chronic lymphocytic leukemia of B-cell type not having achieved remission: Secondary | ICD-10-CM

## 2023-05-18 MED ORDER — VENCLEXTA STARTING PACK 10 MG-50 MG- 100 MG PO DSPK
ORAL_TABLET | 0 refills
Start: 2023-05-18 — End: ?

## 2023-05-20 ENCOUNTER — Encounter: Admit: 2023-05-20 | Discharge: 2023-05-20 | Payer: MEDICARE

## 2023-05-20 DIAGNOSIS — C9110 Chronic lymphocytic leukemia of B-cell type not having achieved remission: Secondary | ICD-10-CM

## 2023-05-20 LAB — CBC AND DIFF
HEMATOCRIT: 23 % — ABNORMAL LOW (ref 36–45)
HEMOGLOBIN: 8.2 g/dL — ABNORMAL LOW (ref 12.0–15.0)
MCH: 31 pg — ABNORMAL LOW (ref 26–34)
MCHC: 34 g/dL (ref 32.0–36.0)
MCV: 91 FL (ref 80–100)
MPV: 6.6 FL — ABNORMAL LOW (ref 7–11)
NEUTROPHILS %: 77 % (ref 41–77)
PLATELET COUNT: 242 K/UL (ref 150–400)
RBC COUNT: 2.5 M/UL — ABNORMAL LOW (ref 4.0–5.0)
RDW: 16 % — ABNORMAL HIGH (ref 11–15)
WBC COUNT: 4 K/UL — ABNORMAL LOW (ref 4.5–11.0)

## 2023-05-20 LAB — LDH-LACTATE DEHYDROGENASE: LDH: 145 U/L (ref 100–210)

## 2023-05-20 LAB — PHOSPHORUS: PHOSPHORUS: 4.6 mg/dL — ABNORMAL HIGH (ref 2.0–4.5)

## 2023-05-20 LAB — COMPREHENSIVE METABOLIC PANEL
POTASSIUM: 3.6 MMOL/L (ref 3.5–5.1)
SODIUM: 140 MMOL/L (ref 137–147)

## 2023-05-20 LAB — URIC ACID: URIC ACID: 3.8 mg/dL (ref 2.0–7.0)

## 2023-05-20 MED FILL — VENETOCLAX 100 MG PO TAB: 100 mg | ORAL | 30 days supply | Qty: 120 | Fill #1 | Status: AC

## 2023-05-25 ENCOUNTER — Encounter: Admit: 2023-05-25 | Discharge: 2023-05-25 | Payer: MEDICARE

## 2023-05-25 NOTE — Progress Notes
Oral Chemotherapy Pharmacist Cycle 1 Toxicity Check    Summary of Therapy   Lindsey Brown continues venetoclax for the treatment of CLL.  Cycle 1 start date was 05/12/23. Lindsey Brown confirms she is taking the medication as prescribed - 50 mg by mouth once daily. Tomorrow she will be increasing her dose to 100 mg daily and will continue titrating weekly until goal dose of 400 mg daily      Adverse Effects and Adherence Assessment   Lindsey Brown is not experiencing any significant adverse effects to this medication regimen.     The patient's ability to self-administer medication was re-assessed. The patient has the ability to self-administer this medication. . Lindsey Brown reports missing 0 doses since the start of therapy. she was re-educated on importance of adherence.    Dose Appropriateness Assessment  No dose adjustments based on toxicity are required at this time and treatment will continue until progression or unacceptable toxicity.      CBC w/Diff    Lab Results   Component Value Date/Time    WBC 4.0 (L) 05/20/2023 08:11 AM    RBC 2.58 (L) 05/20/2023 08:11 AM    HGB 8.2 (L) 05/20/2023 08:11 AM    HCT 23.6 (L) 05/20/2023 08:11 AM    MCV 91.3 05/20/2023 08:11 AM    MCH 31.9 05/20/2023 08:11 AM    MCHC 34.9 05/20/2023 08:11 AM    RDW 16.4 (H) 05/20/2023 08:11 AM    PLTCT 242 05/20/2023 08:11 AM    MPV 6.6 (L) 05/20/2023 08:11 AM    Lab Results   Component Value Date/Time    NEUT 77 05/20/2023 08:11 AM    ANC 3.10 05/20/2023 08:11 AM    LYMA 10 (L) 05/20/2023 08:11 AM    ALC 0.40 (L) 05/20/2023 08:11 AM    MONA 11 05/20/2023 08:11 AM    AMC 0.40 05/20/2023 08:11 AM    EOSA 1 05/20/2023 08:11 AM    AEC 0.00 05/20/2023 08:11 AM    BASA 1 05/20/2023 08:11 AM    ABC 0.00 05/20/2023 08:11 AM        Comprehensive Metabolic Profile    Lab Results   Component Value Date/Time    NA 140 05/20/2023 08:11 AM    K 3.6 05/20/2023 08:11 AM    CL 109 05/20/2023 08:11 AM    CO2 23 05/20/2023 08:11 AM    GAP 8 05/20/2023 08:11 AM    BUN 21 05/20/2023 08:11 AM    CR 1.73 (H) 05/20/2023 08:11 AM    GLU 141 (H) 05/20/2023 08:11 AM    Lab Results   Component Value Date/Time    CA 9.2 05/20/2023 08:11 AM    PO4 4.6 (H) 05/20/2023 08:11 AM    ALBUMIN 3.5 05/20/2023 08:11 AM    TOTPROT 5.9 (L) 05/20/2023 08:11 AM    ALKPHOS 75 05/20/2023 08:11 AM    AST 12 05/20/2023 08:11 AM    ALT 15 05/20/2023 08:11 AM    TOTBILI 0.5 05/20/2023 08:11 AM    GFR >60 02/16/2020 10:55 AM    GFRAA >60 02/16/2020 10:55 AM            Serum creatinine: 1.73 mg/dL (H) 29/56/21 3086  Estimated creatinine clearance: 35.4 mL/min (A)    Medication Reconciliation and Drug/Food Interaction Assessment  Lindsey Brown reports no medication changes since her last medication history. she was instructed to speak with her healthcare provider and/or the oral chemotherapy pharmacist before starting any  new drug, including, but not limited to, prescription, over the counter, natural / herbal products, or vitamins and supplements.     Drug-drug and drug-food interactions were assessed between the patients? specialty medication(s) and her most current medication list. No significant drug-drug interactions were identified.     Allergies   Allergen Reactions    Pcn [Penicillins] HIVES     03/31/2023 patient reports developing hives when she was 17. Tolerates amoxicillin and cephalosporins.    Obinutuzumab FLUSHING (SKIN), NAUSEA ONLY and SEE COMMENTS     Hypertension        Immunization Assessment   Immunization History   Administered Date(s) Administered    COVID-19 (MODERNA), mRNA vacc, 100 mcg/0.5 mL (PF) 02/23/2020, 03/22/2020, 09/07/2020, 07/02/2021    Covid-19 Bivalent (37yr+)(MODERNA), mRNA Vacc, 50 mcg/0.5 mL (PF) 12/02/2021    Covid-19 mRNA Vaccine >=12yo (Moderna)(2023-24 formula)(Spikevax) 09/15/2022    Flu Vaccine =>65 YO High-Dose (PF) 10/18/2020, 10/10/2021    Flu Vaccine Trivalent =>3 Yo (Preservative Free) 10/12/2013    Influenza Vaccine Whole 09/18/2008       Vaccine history and recommended vaccinations were reviewed and will be recommended as appropriate. The patient will be reminded about the importance of receiving an annual influenza vaccine as indicated.    Safety Assessment  Risk Evaluation and Mitigation Strategy (REMS)  No REMS is required for this medication.    Reproductive Risk  Lindsey Brown is a 67 y.o. female. As patient is a female not of child-bearing potential, education was not applicable.     Handling and Disposal  Appropriate safe handling and disposal procedures were reviewed with the patient. Lindsey Brown was instructed to return any unused or expired oral chemotherapy medication to a designated disposal bin at one of the [facility] locations or to utilize a community drug take back program. Instructed not to flush down the toilet or to crush the medication    Follow-up Plan   LOVE TREST was encouraged to call the oral chemotherapy pharmacist at [phone] with questions. This medication is considered medium risk per our internal oral chemotherapy risk categorization. This re-assessment has been completed and the next reassessment planned for 3 months.    Lindsey Brown, [facility]  Oncology Clinical Pharmacist  05/25/2023

## 2023-05-26 ENCOUNTER — Encounter: Admit: 2023-05-26 | Discharge: 2023-05-26 | Payer: MEDICARE

## 2023-05-26 DIAGNOSIS — C9110 Chronic lymphocytic leukemia of B-cell type not having achieved remission: Secondary | ICD-10-CM

## 2023-05-26 LAB — CBC AND DIFF
ABSOLUTE BASO COUNT: 0 K/UL (ref 0–0.20)
ABSOLUTE EOS COUNT: 0 K/UL (ref 0–0.45)
ABSOLUTE LYMPH COUNT: 0.6 K/UL — ABNORMAL LOW (ref 1.0–4.8)
ABSOLUTE MONO COUNT: 0.5 K/UL (ref 0–0.80)
ABSOLUTE NEUTROPHIL: 3 K/UL (ref 1.8–7.0)
BASOPHILS %: 1 % (ref 0–2)
EOSINOPHILS %: 0 % — ABNORMAL LOW (ref >60)
HEMATOCRIT: 22 % — ABNORMAL LOW (ref 36–45)
HEMOGLOBIN: 8 g/dL — ABNORMAL LOW (ref 12.0–15.0)
MCHC: 35 g/dL (ref 32.0–36.0)
NEUTROPHILS %: 72 % (ref 41–77)
RDW: 16 % — ABNORMAL HIGH (ref 11–15)
WBC COUNT: 4.2 K/UL — ABNORMAL LOW (ref 4.5–11.0)

## 2023-05-26 LAB — LDH-LACTATE DEHYDROGENASE: LDH: 161 U/L (ref 100–210)

## 2023-05-26 LAB — COMPREHENSIVE METABOLIC PANEL
ALK PHOSPHATASE: 70 U/L — ABNORMAL LOW (ref 25–110)
ALT: 22 U/L — ABNORMAL LOW (ref 7–56)
ANION GAP: 7 % (ref 3–12)
AST: 20 U/L (ref 7–40)
GLUCOSE,PANEL: 125 mg/dL — ABNORMAL HIGH (ref 70–100)
POTASSIUM: 3.3 MMOL/L — ABNORMAL LOW (ref 3.5–5.1)

## 2023-05-26 LAB — PHOSPHORUS: PHOSPHORUS: 4.8 mg/dL — ABNORMAL HIGH (ref 2.0–4.5)

## 2023-05-26 LAB — URIC ACID: URIC ACID: 3.7 mg/dL — ABNORMAL LOW (ref 2.0–7.0)

## 2023-06-02 ENCOUNTER — Encounter: Admit: 2023-06-02 | Discharge: 2023-06-02 | Payer: MEDICARE

## 2023-06-02 DIAGNOSIS — C9110 Chronic lymphocytic leukemia of B-cell type not having achieved remission: Secondary | ICD-10-CM

## 2023-06-02 LAB — CBC AND DIFF
BASOPHILS %: 1 % (ref 0–2)
HEMATOCRIT: 23 % — ABNORMAL LOW (ref 36–45)
HEMOGLOBIN: 8.2 g/dL — ABNORMAL LOW (ref 12.0–15.0)
MCH: 32 pg (ref 26–34)
MCHC: 34 g/dL (ref 32.0–36.0)
RBC COUNT: 2.5 M/UL — ABNORMAL LOW (ref 4.0–5.0)
WBC COUNT: 2.4 K/UL — ABNORMAL LOW (ref 4.5–11.0)

## 2023-06-02 LAB — COMPREHENSIVE METABOLIC PANEL
ALK PHOSPHATASE: 90 U/L (ref 25–110)
POTASSIUM: 3.3 MMOL/L — ABNORMAL LOW (ref 3.5–5.1)
SODIUM: 141 MMOL/L (ref 137–147)

## 2023-06-02 LAB — URIC ACID: URIC ACID: 3.6 mg/dL (ref 2.0–7.0)

## 2023-06-02 LAB — LDH-LACTATE DEHYDROGENASE: LDH: 165 U/L (ref 100–210)

## 2023-06-02 LAB — PHOSPHORUS: PHOSPHORUS: 3.9 mg/dL (ref 2.0–4.5)

## 2023-06-03 ENCOUNTER — Encounter: Admit: 2023-06-03 | Discharge: 2023-06-03 | Payer: MEDICARE

## 2023-06-03 DIAGNOSIS — C9110 Chronic lymphocytic leukemia of B-cell type not having achieved remission: Secondary | ICD-10-CM

## 2023-06-03 MED ORDER — ALLOPURINOL 300 MG PO TAB
ORAL_TABLET | 0 refills
Start: 2023-06-03 — End: ?

## 2023-06-09 ENCOUNTER — Encounter: Admit: 2023-06-09 | Discharge: 2023-06-09 | Payer: MEDICARE

## 2023-06-09 DIAGNOSIS — M1990 Unspecified osteoarthritis, unspecified site: Secondary | ICD-10-CM

## 2023-06-09 DIAGNOSIS — T7840XA Allergy, unspecified, initial encounter: Secondary | ICD-10-CM

## 2023-06-09 DIAGNOSIS — C9110 Chronic lymphocytic leukemia of B-cell type not having achieved remission: Secondary | ICD-10-CM

## 2023-06-09 DIAGNOSIS — F419 Anxiety disorder, unspecified: Secondary | ICD-10-CM

## 2023-06-09 DIAGNOSIS — J189 Pneumonia, unspecified organism: Secondary | ICD-10-CM

## 2023-06-09 DIAGNOSIS — D539 Nutritional anemia, unspecified: Secondary | ICD-10-CM

## 2023-06-09 DIAGNOSIS — C9590 Leukemia, unspecified not having achieved remission: Secondary | ICD-10-CM

## 2023-06-09 DIAGNOSIS — N289 Disorder of kidney and ureter, unspecified: Secondary | ICD-10-CM

## 2023-06-09 DIAGNOSIS — I10 Essential (primary) hypertension: Secondary | ICD-10-CM

## 2023-06-09 DIAGNOSIS — R011 Cardiac murmur, unspecified: Secondary | ICD-10-CM

## 2023-06-09 MED ORDER — PANTOPRAZOLE 40 MG PO TBEC
40 mg | ORAL_TABLET | Freq: Every day | ORAL | 1 refills | 90.00000 days | Status: AC
Start: 2023-06-09 — End: ?

## 2023-06-09 MED ORDER — DEXAMETHASONE IVPB
20 mg | Freq: Once | INTRAVENOUS | 0 refills | Status: CP
Start: 2023-06-09 — End: ?
  Administered 2023-06-09 (×2): 20 mg via INTRAVENOUS

## 2023-06-09 MED ORDER — FAMOTIDINE 20 MG PO TAB
20 mg | Freq: Once | ORAL | 0 refills | Status: CP
Start: 2023-06-09 — End: ?
  Administered 2023-06-09: 14:00:00 20 mg via ORAL

## 2023-06-09 MED ORDER — OBINUTUZUMAB 1000MG IVPB (MAINT DOSE)
1000 mg | Freq: Once | INTRAVENOUS | 0 refills | Status: CP
Start: 2023-06-09 — End: ?
  Administered 2023-06-09 (×2): 1000 mg via INTRAVENOUS

## 2023-06-09 MED ORDER — PROCHLORPERAZINE MALEATE 10 MG PO TAB
10 mg | ORAL_TABLET | ORAL | 3 refills | Status: AC | PRN
Start: 2023-06-09 — End: ?

## 2023-06-09 MED ORDER — DIPHENHYDRAMINE HCL 25 MG PO CAP
25 mg | Freq: Once | ORAL | 0 refills | Status: CP
Start: 2023-06-09 — End: ?
  Administered 2023-06-09: 14:00:00 25 mg via ORAL

## 2023-06-09 MED ORDER — ACETAMINOPHEN 500 MG PO TAB
1000 mg | Freq: Once | ORAL | 0 refills | Status: CP
Start: 2023-06-09 — End: ?
  Administered 2023-06-09: 14:00:00 1000 mg via ORAL

## 2023-06-09 NOTE — Progress Notes
Name: Lindsey Brown          MRN: [id number]      DOB: January 05, 1956      AGE: 66 y.o.   DATE OF SERVICE: 06/09/2023    Subjective:             Reason for Visit:  Cancer      Lindsey Brown is a 67 y.o. female.      Cancer Staging   CLL (chronic lymphocytic leukemia) (HCC)  Staging form: Chronic Lymphocytic Leukemia / Small Lymphocytic Lymphoma, AJCC 8th Edition  - Clinical stage from 02/05/2023: Modified Rai Stage III (Modified Rai risk: High, Lymphocytosis: Present, Adenopathy: Present, Organomegaly: Absent, Anemia: Present, Thrombocytopenia: Absent) - Signed by Violeta Gelinas, MD on 02/05/2023      [name] presents today in hematologic consultation for management of CLL at the request of Dr. Angelyn Punt.     She has the following detailed history:  1.  Presented in January 2021 with palpable adenopathy for at least 2 years accompanied by peripheral blood lymphocytosis.  She was referred to Dr. Angelyn Punt.  2.  01/26/2020 bone marrow biopsy showed 60% involvement with CLL.  There was not much surface immunoglobulin, but a suggestion of mild kappa restriction.  CLL FISH panel was unremarkable.  Conventional karyotype was diploid.  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  3.  She developed worsening renal insufficiency and workup that showed no evidence of circulating paraprotein or abnormal free light chain ratios.  Her 24-hour urine revealed kappa free light chain on immunofixation.  Renal biopsy shows deposition of IgG kappa restriction, c/w injury from CLL.  4.  Peripheral blood FISH showed no abnormalities.  Peripheral blood NGS had no significant abnormalities.  She did have CloneSeq probes sent and has acceptable sequences for molecular MRD testing.  5.  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  6.  04/15/2023 peripheral blood FISH showed no abnormalities.  NGS is pending.  7.  April 2024 initiated venetoclax and obinutuzumab off study.    Interim History:  I have had some bouts of nausea that hit at the weirdest time.  Taking omeprazole and feels like that helps.  Not taking Zofran due to fear of constipation.  She has been on venetoclax 200 mg p.o. daily for the last week.  Her lower extremity edema has resolved.    Lymphadenopathy has resolved completely.  Night sweats resolved.    I have reviewed and updated the past medical, social and family histories in the history section and they are up to date as of this visit.    I have extensively reviewed the laboratory, pathology and radiology, both internal and external, and the key findings are summarized above.         Review of Systems   All other systems reviewed and are negative.        Objective:          acyclovir (ZOVIRAX) 400 mg tablet Take one tablet by mouth every 12 hours.    allopurinoL (ZYLOPRIM) 300 mg tablet Begin taking 2-3 days prior to Venetoclax.  Take two tablets by mouth on the first day, then take one tablet by mouth daily.    ALPRAZolam (XANAX) 0.25 mg tablet Take one tablet by mouth twice daily as needed for Sleep or Anxiety.    amLODIPine (NORVASC) 10 mg tablet Take one tablet by mouth daily. Indications: high blood pressure  atorvastatin (LIPITOR) 10 mg tablet TAKE 1 TABLET BY MOUTH ONCE DAILY FOR 90 DAYS    cyanocobalamin (vitamin B-12) 500 mcg tablet Take one tablet by mouth daily.    diazePAM (VALIUM) 5 mg tablet Take  by mouth.    ergocalciferol (vitamin D2) (VITAMIN D PO) Take 1 tablet by mouth daily.    ondansetron (ZOFRAN ODT) 4 mg rapid dissolve tablet Dissolve one tablet by mouth every 6 hours as needed. Place on tongue to dissolve.  Indications: nausea and vomiting    pantoprazole DR (PROTONIX) 40 mg tablet Take one tablet by mouth daily. In Am on an empty stomach    prochlorperazine maleate (COMPAZINE) 10 mg tablet Take one tablet by mouth every 6 hours as needed for Nausea or Vomiting.    venetoclax (VENCLEXTA) 100 mg tablet Take four tablets by mouth daily. Do not begin taking until starter pack is completed.  Take with food.     Vitals:    06/09/23 0744   BP: (!) 154/91   BP Source: Arm, Left Upper   Pulse: 114   Temp: 36.4 ?C (97.6 ?F)   Resp: 16   SpO2: 100%   PainSc: Zero   Weight: 78.9 kg (174 lb)     Body mass index is 26.46 kg/m?Marland Kitchen     Pain Score: Zero       Fatigue Scale: 6    Pain Addressed:  N/A    Patient Evaluated for a Clinical Trial: Patient not eligible for a treatment trial (including not needing treatment, needs palliative care, in remission).     [place] Cooperative Oncology Group performance status is 1, Restricted in physically strenuous activity but ambulatory and able to carry out work of a light or sedentary nature, e.g., light house work, office work.     Physical Exam  Vitals and nursing note reviewed.   Constitutional:       Appearance: She is well-developed.   HENT:      Head: Normocephalic.   Eyes:      General: No scleral icterus.     Conjunctiva/sclera: Conjunctivae normal.   Cardiovascular:      Rate and Rhythm: Normal rate and regular rhythm.   Pulmonary:      Effort: Pulmonary effort is normal. No respiratory distress.   Abdominal:      Palpations: Abdomen is soft.   Musculoskeletal:      Cervical back: Neck supple.      Right lower leg: No edema.      Left lower leg: No edema.   Lymphadenopathy:      Comments:   Left cervical node resolved.  Liver and spleen nonpalpable   Skin:     Findings: No rash.   Neurological:      Mental Status: She is alert.               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  Renal function is improving, platelets have normalized, and adenopathy has resolved, all consistent with an excellent response.  Unfortunately she does have some persistent nausea.  This is responsive to a  PPI and I think there is likely a GERD component.  We will treat this aggressively.  Start pantoprazole 40 mg p.o. daily first thing in the morning on an empty stomach.  She does have some issues with ondansetron secondary to constipation.  I have given her prescription for Compazine 10 mg every 6 hours as needed for nausea and urged her to take this prior to each venetoclax dose if she notices reliable nausea.  We additionally discussed that the Compazine may cause a little bit of sedation and, if required, she can convert to evening dosing of the venetoclax which would be perfectly reasonable moving forward now that her uptitration has been completed.  Increase venetoclax to 400 mg PO daily today.  She has an extraordinarily low risk of tumor lysis at this point.  Continue obinutuzumab 1000 mg IV with C3 D1 = 06/09/2023.  Continue allopurinol 300 mg PO daily for one more week, then stop.  Continue acyclovir 400 mg PO twice daily  She has acceptable CloneSeq probes for MRD testing at completion of therapy.  We will need to repeat a 24-hour urine study in the future, likely at the 27-month point after completion of obinutuzumab and a few months of being on full dose venetoclax.    RTC with APP in 1 month, with me in 2 months, or sooner should new or concerning issues arise.

## 2023-06-09 NOTE — Progress Notes
Patient presented to clinic for C3 Gazyva infusion. 22G Peripheral IV placed with positive blood return. Patient's labs okay to treat. Patient tolerated infusion well without complications. Peripheral IV flushed with saline with positive blood return and removed. Patient discharged off unit in stable condition.     Cancer Therapeutics Note  Verified consent signed and in chart.    Blood return positive via: Peripheral (22 ga)    BSA and dose double checked (agree with orders as written) with: yes     Labs/applicable tests checked: CBC and Comprehensive Metabolic Panel (CMP)    Treatment regimen: D1C3 GAZYVA  obinutuzumab (GAZYVA) 1,000 mg in sodium chloride 0.9% (NS) 290 mL IVPB [[id number]] (Order [id number])       Rate verified and armband double check with second RN: yes    Patient education offered and stated understanding. Denies questions at this time.

## 2023-06-12 ENCOUNTER — Encounter: Admit: 2023-06-12 | Discharge: 2023-06-12 | Payer: MEDICARE

## 2023-06-15 ENCOUNTER — Encounter: Admit: 2023-06-15 | Discharge: 2023-06-15 | Payer: MEDICARE

## 2023-06-15 MED FILL — VENETOCLAX 100 MG PO TAB: 100 mg | ORAL | 30 days supply | Qty: 120 | Fill #2 | Status: AC

## 2023-06-15 NOTE — Progress Notes
Contacted Lindsey Brown to refill their medication(s) VENCLEXTA 100 MG PO TAB.    On MyChart medication refill questionnaire patient reported medication side effects: Pt reported Nausea ,Dr. Mikey Bussing has addressed     The refill was processed. Ambulatory pharmacist notified.    Caro Laroche  [facility]  [phone]

## 2023-06-17 ENCOUNTER — Encounter: Admit: 2023-06-17 | Discharge: 2023-06-17 | Payer: MEDICARE

## 2023-07-02 ENCOUNTER — Encounter: Admit: 2023-07-02 | Discharge: 2023-07-02 | Payer: MEDICARE

## 2023-07-06 ENCOUNTER — Encounter: Admit: 2023-07-06 | Discharge: 2023-07-06 | Payer: MEDICARE

## 2023-07-07 ENCOUNTER — Encounter: Admit: 2023-07-07 | Discharge: 2023-07-07 | Payer: MEDICARE

## 2023-07-07 NOTE — Progress Notes
Contacted Lindsey Brown to refill their medication(s) VENCLEXTA 100 MG PO TAB.    Patient declined a refill because  [name]  [facility]  [phone]

## 2023-07-08 ENCOUNTER — Encounter: Admit: 2023-07-08 | Discharge: 2023-07-08 | Payer: MEDICARE

## 2023-07-08 DIAGNOSIS — R011 Cardiac murmur, unspecified: Secondary | ICD-10-CM

## 2023-07-08 DIAGNOSIS — C9110 Chronic lymphocytic leukemia of B-cell type not having achieved remission: Secondary | ICD-10-CM

## 2023-07-08 DIAGNOSIS — N289 Disorder of kidney and ureter, unspecified: Secondary | ICD-10-CM

## 2023-07-08 DIAGNOSIS — D539 Nutritional anemia, unspecified: Secondary | ICD-10-CM

## 2023-07-08 DIAGNOSIS — J189 Pneumonia, unspecified organism: Secondary | ICD-10-CM

## 2023-07-08 DIAGNOSIS — C9590 Leukemia, unspecified not having achieved remission: Secondary | ICD-10-CM

## 2023-07-08 DIAGNOSIS — I10 Essential (primary) hypertension: Secondary | ICD-10-CM

## 2023-07-08 DIAGNOSIS — F419 Anxiety disorder, unspecified: Secondary | ICD-10-CM

## 2023-07-08 DIAGNOSIS — D701 Agranulocytosis secondary to cancer chemotherapy: Secondary | ICD-10-CM

## 2023-07-08 DIAGNOSIS — T7840XA Allergy, unspecified, initial encounter: Secondary | ICD-10-CM

## 2023-07-08 DIAGNOSIS — M1990 Unspecified osteoarthritis, unspecified site: Secondary | ICD-10-CM

## 2023-07-08 LAB — MAGNESIUM: MAGNESIUM: 1.9 mg/dL (ref 1.6–2.6)

## 2023-07-08 LAB — CBC AND DIFF
ABSOLUTE BASO COUNT: 0 10*3/uL (ref 0–0.20)
ABSOLUTE EOS COUNT: 0 10*3/uL (ref 0–0.45)
ABSOLUTE LYMPH COUNT: 0.9 10*3/uL — ABNORMAL LOW (ref 1.0–4.8)
ABSOLUTE MONO COUNT: 0.4 10*3/uL (ref 0–0.80)
ABSOLUTE NEUTROPHIL: 0.7 10*3/uL — ABNORMAL LOW (ref 1.8–7.0)
BASOPHILS %: 1 % (ref 0–2)
EOSINOPHILS %: 0 % (ref 0–5)
HEMATOCRIT: 29 % — ABNORMAL LOW (ref 36–45)
HEMOGLOBIN: 10 g/dL — ABNORMAL LOW (ref 12.0–15.0)
LYMPHOCYTES %: 42 % (ref 24–44)
MCH: 32 pg (ref 26–34)
MCV: 90 FL (ref 80–100)
MONOCYTES %: 21 % — ABNORMAL HIGH (ref >60)
MPV: 8.3 FL (ref 7–11)
NEUTROPHILS %: 36 % — ABNORMAL LOW (ref 41–77)
PLATELET COUNT: 225 10*3/uL (ref 150–400)
RBC COUNT: 3.2 M/UL — ABNORMAL LOW (ref 4.0–5.0)
RDW: 13 % (ref 11–15)
WBC COUNT: 2 10*3/uL — ABNORMAL LOW (ref 4.5–11.0)

## 2023-07-08 LAB — URIC ACID: URIC ACID: 5.7 mg/dL (ref 2.0–7.0)

## 2023-07-08 LAB — COMPREHENSIVE METABOLIC PANEL
CHLORIDE: 105 MMOL/L (ref 98–110)
POTASSIUM: 4.1 MMOL/L (ref 3.5–5.1)
SODIUM: 140 MMOL/L (ref 137–147)

## 2023-07-08 LAB — PHOSPHORUS: PHOSPHORUS: 4.1 mg/dL (ref 2.0–4.5)

## 2023-07-08 MED ORDER — FILGRASTIM-SNDZ 480 MCG/0.8 ML IJ SYRG
5 ug/kg | Freq: Once | SUBCUTANEOUS | 0 refills
Start: 2023-07-08 — End: ?

## 2023-07-08 MED ORDER — DEXAMETHASONE IVPB
12 mg | Freq: Once | INTRAVENOUS | 0 refills | Status: CN
Start: 2023-07-08 — End: ?

## 2023-07-08 MED ORDER — DIPHENHYDRAMINE HCL 25 MG PO CAP
25 mg | Freq: Once | ORAL | 0 refills | Status: CP
Start: 2023-07-08 — End: ?
  Administered 2023-07-08: 21:00:00 25 mg via ORAL

## 2023-07-08 MED ORDER — FAMOTIDINE 20 MG PO TAB
20 mg | Freq: Once | ORAL | 0 refills | Status: CP
Start: 2023-07-08 — End: ?
  Administered 2023-07-08: 21:00:00 20 mg via ORAL

## 2023-07-08 MED ORDER — FILGRASTIM-SNDZ 480 MCG/0.8 ML IJ SYRG
5 ug/kg | Freq: Once | SUBCUTANEOUS | 0 refills | Status: CN
Start: 2023-07-08 — End: ?

## 2023-07-08 MED ORDER — OBINUTUZUMAB 1000MG IVPB (MAINT DOSE)
1000 mg | Freq: Once | INTRAVENOUS | 0 refills | Status: CP
Start: 2023-07-08 — End: ?
  Administered 2023-07-08 (×2): 1000 mg via INTRAVENOUS

## 2023-07-08 MED ORDER — DEXAMETHASONE IVPB
12 mg | Freq: Once | INTRAVENOUS | 0 refills | Status: CP
Start: 2023-07-08 — End: ?
  Administered 2023-07-08 (×2): 12 mg via INTRAVENOUS

## 2023-07-08 MED ORDER — FILGRASTIM-SNDZ 480 MCG/0.8 ML IJ SYRG
5 ug/kg | Freq: Once | SUBCUTANEOUS | 0 refills | Status: CP
Start: 2023-07-08 — End: ?
  Administered 2023-07-08: 22:00:00 480 ug via SUBCUTANEOUS

## 2023-07-08 MED ORDER — ACETAMINOPHEN 500 MG PO TAB
1000 mg | Freq: Once | ORAL | 0 refills | Status: CP
Start: 2023-07-08 — End: ?
  Administered 2023-07-08: 21:00:00 1000 mg via ORAL

## 2023-07-08 NOTE — Progress Notes
Cancer Therapeutics Note  Verified consent signed and in chart.    Blood return positive via: Peripheral (22 ga)    BSA and dose double checked (agree with orders as written) with: yes see MAR    Labs/applicable tests checked: CBC and Comprehensive Metabolic Panel (CMP)    Treatment regimen: C4D1    obinutuzumab (GAZYVA) 1,000 mg in sodium chloride 0.9% (NS) 290 mL IVPB    Rate verified and armband double check with second RN: yes    Patient education offered and stated understanding. Denies questions at this time.    Patient arrived to CC treatment after being seen in clinic with Kavin Leech, APRN; please refer to clinic note for assessment details. Ok to treat with ANC 700 per Kavin Leech, APRN. Premeds given per treatment plan. Gazyva given w/o complications, patient tolerated well.  Zarxio given in RLQ. PIV positive for blood return, flushed with saline, and removed. Patient declined copy of labs and AVS. All questions and concerns addressed. Patient left CC treatment with husband in stable condition.

## 2023-07-09 ENCOUNTER — Encounter: Admit: 2023-07-09 | Discharge: 2023-07-09 | Payer: MEDICARE

## 2023-07-09 DIAGNOSIS — C9110 Chronic lymphocytic leukemia of B-cell type not having achieved remission: Secondary | ICD-10-CM

## 2023-07-09 DIAGNOSIS — D701 Agranulocytosis secondary to cancer chemotherapy: Secondary | ICD-10-CM

## 2023-07-09 MED ORDER — FILGRASTIM-SNDZ 480 MCG/0.8 ML IJ SYRG
5 ug/kg | Freq: Once | SUBCUTANEOUS | 0 refills | Status: CP
Start: 2023-07-09 — End: ?
  Administered 2023-07-09: 20:00:00 480 ug via SUBCUTANEOUS

## 2023-07-09 NOTE — Progress Notes
Patient arrived to CC treatment for injection. Zarxio given in their left lower abdominal tissue and tolerated without difficulty. All questions and concerned addressed. Patient left CC treatment in stable condition.

## 2023-07-09 NOTE — Patient Instructions
[facility]  Chemotherapy Instructions    DANINA YARROW 07/09/2023    Treatment Received Today:  Ovidio Kin    Administer filgrastim 480 mcg SQ today, then daily for 2-3 days until ANC > 1,000 with repeat labs on day 3     Call Immediately to report the following:  Unexplained bleeding or bleeding that will not stop  Difficulty swallowing  Shortness of breath, wheezing, or trouble breathing  Rapid, irregular heartbeat; chest pain  Dizziness, lightheadedness  Rash or cut that swells or turns red, feels hot or painful, or begin to ooze  Diarrhea   Uncontrolled nausea or vomiting  Fever of 100.4 F or higher, or chills    Important Phone Numbers:  Cancer Center Main Number (answered 24 hours a day) [phone]  Cancer Center Scheduling (appointments) [phone]  Social Worker [phone]

## 2023-07-10 ENCOUNTER — Encounter: Admit: 2023-07-10 | Discharge: 2023-07-10 | Payer: MEDICARE

## 2023-07-10 DIAGNOSIS — M1990 Unspecified osteoarthritis, unspecified site: Secondary | ICD-10-CM

## 2023-07-10 DIAGNOSIS — C9110 Chronic lymphocytic leukemia of B-cell type not having achieved remission: Secondary | ICD-10-CM

## 2023-07-10 DIAGNOSIS — M898X9 Other specified disorders of bone, unspecified site: Secondary | ICD-10-CM

## 2023-07-10 DIAGNOSIS — F419 Anxiety disorder, unspecified: Secondary | ICD-10-CM

## 2023-07-10 DIAGNOSIS — C9590 Leukemia, unspecified not having achieved remission: Secondary | ICD-10-CM

## 2023-07-10 DIAGNOSIS — R011 Cardiac murmur, unspecified: Secondary | ICD-10-CM

## 2023-07-10 DIAGNOSIS — T7840XA Allergy, unspecified, initial encounter: Secondary | ICD-10-CM

## 2023-07-10 DIAGNOSIS — N289 Disorder of kidney and ureter, unspecified: Secondary | ICD-10-CM

## 2023-07-10 DIAGNOSIS — D539 Nutritional anemia, unspecified: Secondary | ICD-10-CM

## 2023-07-10 DIAGNOSIS — D701 Agranulocytosis secondary to cancer chemotherapy: Secondary | ICD-10-CM

## 2023-07-10 DIAGNOSIS — I10 Essential (primary) hypertension: Secondary | ICD-10-CM

## 2023-07-10 DIAGNOSIS — J189 Pneumonia, unspecified organism: Secondary | ICD-10-CM

## 2023-07-10 LAB — CBC AND DIFF
ABSOLUTE NEUTROPHIL COUNT MANUAL: 3 10*3/uL (ref 1.8–7.0)
BAND NEUTROPHIL: 8 % (ref 0–10)
HEMATOCRIT: 29 % — ABNORMAL LOW (ref 36–45)
HEMOGLOBIN: 10 g/dL — ABNORMAL LOW (ref 12.0–15.0)
LYMPHOCYTES: 25 % (ref 24–44)
MCH: 32 pg (ref 26–34)
MCHC: 35 g/dL (ref 32.0–36.0)
MCV: 92 FL (ref 80–100)
METAMYELOCYTES %: 1 %
MONOCYTES %: 19 % — ABNORMAL HIGH (ref 4–12)
MPV: 8.4 FL (ref 7–11)
NEUTROPHILS,SEG: 47 % (ref 41–77)
PLATELET COUNT: 71 10*3/uL — ABNORMAL LOW (ref 150–400)
RBC COUNT: 3.1 M/UL — ABNORMAL LOW (ref 4.0–5.0)
RDW: 13 % (ref 11–15)
WBC COUNT: 5.5 10*3/uL (ref 4.5–11.0)

## 2023-07-10 MED ORDER — CELECOXIB 100 MG PO CAP
100 mg | ORAL_CAPSULE | Freq: Two times a day (BID) | ORAL | 0 refills | Status: AC
Start: 2023-07-10 — End: ?

## 2023-07-10 MED ORDER — FILGRASTIM-SNDZ 480 MCG/0.8 ML IJ SYRG
5 ug/kg | Freq: Once | SUBCUTANEOUS | 0 refills
Start: 2023-07-10 — End: ?

## 2023-07-10 MED ORDER — FILGRASTIM-SNDZ 480 MCG/0.8 ML IJ SYRG
5 ug/kg | Freq: Once | SUBCUTANEOUS | 0 refills | Status: AC
Start: 2023-07-10 — End: ?

## 2023-07-10 NOTE — Telephone Encounter
Lelah called to report she has chest pain that radiates to her back, bo 140/95 and feels tachycardic.    She received D1C4 obinutuzumab 07/17, and filgrastim 07/17 (ANC 0.7) and 07/18 and scheduled again today. She did not have another lab draw yesterday.  Reviewed with G-CSF it is a side effect to have sternal pain and to take Claritin, Benadryl, and Tylenol.  She is scheduled this afternoon for filgrastim, but she is concerned and would like to see a Provider.  Offered for her to come to OP clinic and can have injection here if needs, but will draw labs first. She was appreciative for the coordination,

## 2023-07-10 NOTE — Progress Notes
Name: Lindsey Brown          MRN: [id number]      DOB: 12-01-1956      AGE: 67 y.o.   DATE OF SERVICE: 07/10/2023    Subjective:             Reason for Visit:  Follow Up      Lindsey Brown is a 67 y.o. female patient of Dr. Neita Garnet with CLL on obinutuzumab seen today as an urgent add-on for sternal pain.   She is Cycle 4 Day 3 obinutuzumab + venetoclax. She received filgrastim day 1 and 2 for neutropenia.      Cancer Staging   CLL (chronic lymphocytic leukemia) (HCC)  Staging form: Chronic Lymphocytic Leukemia / Small Lymphocytic Lymphoma, AJCC 8th Edition  - Clinical stage from 02/05/2023: Modified Rai Stage III (Modified Rai risk: High, Lymphocytosis: Present, Adenopathy: Present, Organomegaly: Absent, Anemia: Present, Thrombocytopenia: Absent) - Signed by Violeta Gelinas, MD on 02/05/2023      Lindsey Brown presents today in hematologic consultation for management of CLL at the request of Dr. Angelyn Punt.    She has the following detailed history:  1.  Presented in January 2021 with palpable adenopathy for at least 2 years accompanied by peripheral blood lymphocytosis.  She was referred to Dr. Angelyn Punt.  2.  01/26/2020 bone marrow biopsy showed 60% involvement with CLL.  There was not much surface immunoglobulin, but a suggestion of mild kappa restriction.  CLL FISH panel was unremarkable.  Conventional karyotype was diploid.  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  3.  She developed worsening renal insufficiency and workup that showed no evidence of circulating paraprotein or abnormal free light chain ratios.  Her 24-hour urine revealed kappa free light chain on immunofixation.  Renal biopsy shows deposition of IgG kappa restriction, c/w injury from CLL.  4.  Peripheral blood FISH showed no abnormalities.  Peripheral blood NGS is pending.  5. Obinutuzumab initiated on 04/16/2023 with plans to initiate venetoclax in May 2024.          Interim History 07/10/2023:  Filgrastim injections the last 2 days.   Last night started having chest pain, woke up at 12:30. Took ASA and valium. Work up at [employer] with pain, took prilosec, glass milk.   Woke up at 7AM, called clinic.   Took benadryl, tylenol, claritin and now has complete resolution of chest pain.     Denies dizziness, CP, blurry vision or racing heart or palpitations.   Tired now because of benadryl    Reflux better with protonix  Taking Venetoclax at bedtime to help w/nausea - no missed doses  Denies cough, fevers           Review of Systems    All other systems reviewed and are negative.    Objective:          acyclovir (ZOVIRAX) 400 mg tablet Take one tablet by mouth every 12 hours.    allopurinoL (ZYLOPRIM) 300 mg tablet Begin taking 2-3 days prior to Venetoclax.  Take two tablets by mouth on the first day, then take one tablet by mouth daily.    ALPRAZolam (XANAX) 0.25 mg tablet Take one tablet by mouth twice daily as needed for Sleep or Anxiety.    amLODIPine (NORVASC) 10 mg tablet Take one tablet by mouth daily. Indications: high blood pressure    atorvastatin (LIPITOR) 10 mg tablet TAKE 1 TABLET BY MOUTH ONCE DAILY FOR 90 DAYS    celecoxib (CELEBREX)  100 mg capsule Take one capsule by mouth twice daily.    cyanocobalamin (vitamin B-12) 500 mcg tablet Take one tablet by mouth daily.    diazePAM (VALIUM) 5 mg tablet Take  by mouth.    ergocalciferol (vitamin D2) (VITAMIN D PO) Take 1 tablet by mouth daily.    ondansetron (ZOFRAN ODT) 4 mg rapid dissolve tablet Dissolve one tablet by mouth every 6 hours as needed. Place on tongue to dissolve.  Indications: nausea and vomiting    pantoprazole DR (PROTONIX) 40 mg tablet Take one tablet by mouth daily. In Am on an empty stomach    prochlorperazine maleate (COMPAZINE) 10 mg tablet Take one tablet by mouth every 6 hours as needed for Nausea or Vomiting.    venetoclax (VENCLEXTA) 100 mg tablet Take four tablets by mouth daily. Do not begin taking until starter pack is completed.  Take with food.     Vitals:    07/10/23 1058 BP: (!) 153/81   BP Source: Arm, Right Upper   Pulse: 101   Temp: 36.6 ?C (97.8 ?F)   Resp: 16   SpO2: 100%   TempSrc: Oral   PainSc: Two   Weight: 79.5 kg (175 lb 3.2 oz)       Body mass index is 26.65 kg/m?Marland Kitchen     Pain Score: Two  Pain Loc: Chest    Fatigue Scale: 10-Extreme    Pain Addressed:  Diagnostic test ordered    Patient Evaluated for a Clinical Trial: Patient not eligible for a treatment trial (including not needing treatment, needs palliative care, in remission).     [place] Cooperative Oncology Group performance status is 0, Fully active, able to carry on all pre-disease performance without restriction.Marland Kitchen     Physical Exam  Vitals and nursing note reviewed.   Constitutional:       General: She is not in acute distress.     Appearance: Normal appearance. She is well-developed. She is not ill-appearing.   HENT:      Head: Normocephalic.      Mouth/Throat:      Pharynx: Oropharynx is clear. No oropharyngeal exudate.   Eyes:      General: No scleral icterus.     Conjunctiva/sclera: Conjunctivae normal.   Cardiovascular:      Rate and Rhythm: Normal rate and regular rhythm.      Pulses: Normal pulses.      Heart sounds: Normal heart sounds. No murmur heard.  Pulmonary:      Effort: Pulmonary effort is normal. No respiratory distress.      Breath sounds: Normal breath sounds. No decreased breath sounds.   Chest:      Chest wall: No tenderness.   Abdominal:      Palpations: Abdomen is soft. There is no hepatomegaly or splenomegaly.      Tenderness: There is no abdominal tenderness. There is no right CVA tenderness, left CVA tenderness or guarding. Negative signs include Murphy's sign.   Musculoskeletal:         General: Normal range of motion.      Cervical back: Neck supple.      Right lower leg: Edema present.      Left lower leg: Edema present.   Lymphadenopathy:      Comments: Resolution of left cervical LN      Skin:     General: Skin is warm and dry.      Capillary Refill: Capillary refill takes less than 2 seconds.  Coloration: Skin is not jaundiced or pale.      Findings: No rash.   Neurological:      Mental Status: She is alert and oriented to person, place, and time.   Psychiatric:         Mood and Affect: Mood normal.         Behavior: Behavior normal.                 Assessment and Plan:  Sternal bone pain s/2 pegfilgrastim. Relieved with antihistamines and acetaminophen. E-scribed Celebrex with instructions to take 100-200mg  once or twice daily for 3-5 days for bone pain. She should continue management with Claritin, benadryl and acetaminophen as well. Instructions provided today. If chest pain recurs and is not relieved with these measures or is accompanied by racing heart, palpitations or dizziness she should report to ED for cardiac eval.    Neutropenia. Filgrastim given 7/17 and 7/18.  ANC 3000 today, injectio not indicated.   Rai Stage III CLL/SLL with normal FISH and mutated IgHV status. She is Cycle 4 Day 3 obinutuzumab . She take venetoclax 400 mg PO daily at bedtime and is tolerating well.   Nausea. Continue Compazine 10 mg every 6 hours as needed   GERD.  Continue pantoprazole 40 mg p.o. daily in AM   Bilateral lower extremity edema.  Stable  Recent left lower lobe pneumonia          RTC with Christine in 1 week or sooner should new or concerning issues arise.      Donnie Aho, APRN-NP  Lymphoma Nurse Practitioner with Dr. Olene Craven, Dr. Lelon Frohlich and Dr. Herminio Commons.   Division of Hematology and Oncology  Pager [phone] or available on Voalte

## 2023-07-14 ENCOUNTER — Encounter: Admit: 2023-07-14 | Discharge: 2023-07-14 | Payer: MEDICARE

## 2023-07-14 DIAGNOSIS — D701 Agranulocytosis secondary to cancer chemotherapy: Secondary | ICD-10-CM

## 2023-07-14 DIAGNOSIS — C9110 Chronic lymphocytic leukemia of B-cell type not having achieved remission: Secondary | ICD-10-CM

## 2023-07-21 ENCOUNTER — Encounter: Admit: 2023-07-21 | Discharge: 2023-07-21 | Payer: MEDICARE

## 2023-07-22 MED FILL — VENETOCLAX 100 MG PO TAB: 100 mg | ORAL | 30 days supply | Qty: 120 | Fill #3 | Status: AC

## 2023-08-04 ENCOUNTER — Encounter: Admit: 2023-08-04 | Discharge: 2023-08-04 | Payer: MEDICARE

## 2023-08-04 NOTE — Telephone Encounter
Lindsey Brown called to report that she has had n/v and loose stools that started last night.  She feels better today.  Returned her call for assessment.  She denies any fevers, chills, shortness of breath or other issues. She took compazine 1x yesterday. She has 4-5 loose stools and 4 emesis last night.  Recommended that she try to eat a bland diet, toast, banana, yogurt, saltines and take another compazine and imodium. Also try to sip on electrolyte replenishing beverages like Gatorade or Pedialyte.   She has a home covid test and will take this and call us back to report results and if her symptoms have improved.   She has not been taking pantoprazole and asked her to take daily while on treatment as can help her nausea.    She is scheduled for C5 on 08/16 and will see Erby Pian, APRN.

## 2023-08-05 ENCOUNTER — Encounter: Admit: 2023-08-05 | Discharge: 2023-08-05 | Payer: MEDICARE

## 2023-08-06 ENCOUNTER — Encounter: Admit: 2023-08-06 | Discharge: 2023-08-06 | Payer: MEDICARE

## 2023-08-07 ENCOUNTER — Encounter: Admit: 2023-08-07 | Discharge: 2023-08-07 | Payer: MEDICARE

## 2023-08-07 DIAGNOSIS — D539 Nutritional anemia, unspecified: Secondary | ICD-10-CM

## 2023-08-07 DIAGNOSIS — C9110 Chronic lymphocytic leukemia of B-cell type not having achieved remission: Secondary | ICD-10-CM

## 2023-08-07 DIAGNOSIS — D701 Agranulocytosis secondary to cancer chemotherapy: Secondary | ICD-10-CM

## 2023-08-07 DIAGNOSIS — M1990 Unspecified osteoarthritis, unspecified site: Secondary | ICD-10-CM

## 2023-08-07 DIAGNOSIS — T7840XA Allergy, unspecified, initial encounter: Secondary | ICD-10-CM

## 2023-08-07 DIAGNOSIS — T451X5A Adverse effect of antineoplastic and immunosuppressive drugs, initial encounter: Secondary | ICD-10-CM

## 2023-08-07 DIAGNOSIS — R011 Cardiac murmur, unspecified: Secondary | ICD-10-CM

## 2023-08-07 DIAGNOSIS — F419 Anxiety disorder, unspecified: Secondary | ICD-10-CM

## 2023-08-07 DIAGNOSIS — I10 Essential (primary) hypertension: Secondary | ICD-10-CM

## 2023-08-07 DIAGNOSIS — C9590 Leukemia, unspecified not having achieved remission: Secondary | ICD-10-CM

## 2023-08-07 DIAGNOSIS — N289 Disorder of kidney and ureter, unspecified: Secondary | ICD-10-CM

## 2023-08-07 DIAGNOSIS — J189 Pneumonia, unspecified organism: Secondary | ICD-10-CM

## 2023-08-07 LAB — CBC AND DIFF
ABSOLUTE BASO COUNT: 0 10*3/uL (ref 0–0.20)
ABSOLUTE EOS COUNT: 0 10*3/uL (ref 0–0.45)
ABSOLUTE LYMPH COUNT: 1.1 10*3/uL (ref 1.0–4.8)
ABSOLUTE MONO COUNT: 0.5 10*3/uL (ref 0–0.80)
ABSOLUTE NEUTROPHIL: 1 10*3/uL — ABNORMAL LOW (ref 1.8–7.0)
BASOPHILS %: 0 % (ref 0–2)
EOSINOPHILS %: 0 % — ABNORMAL LOW (ref >60)
HEMATOCRIT: 32 % — ABNORMAL LOW (ref 36–45)
HEMOGLOBIN: 11 g/dL — ABNORMAL LOW (ref 12.0–15.0)
LYMPHOCYTES %: 40 % (ref 24–44)
MCH: 32 pg (ref 26–34)
MCHC: 36 g/dL — ABNORMAL HIGH (ref 32.0–36.0)
MCV: 89 FL (ref 80–100)
MONOCYTES %: 20 % — ABNORMAL HIGH (ref 4–12)
MPV: 7.7 FL (ref 7–11)
NEUTROPHILS %: 40 % — ABNORMAL LOW (ref 41–77)
PLATELET COUNT: 246 10*3/uL — ABNORMAL HIGH (ref 150–400)
RBC COUNT: 3.6 M/UL — ABNORMAL LOW (ref 4.0–5.0)
RDW: 13 % (ref 11–15)
WBC COUNT: 2.6 10*3/uL — ABNORMAL LOW (ref 4.5–11.0)

## 2023-08-07 LAB — URIC ACID: URIC ACID: 5.6 mg/dL (ref 2.0–7.0)

## 2023-08-07 LAB — PHOSPHORUS: PHOSPHORUS: 4 mg/dL (ref 2.0–4.5)

## 2023-08-07 LAB — COMPREHENSIVE METABOLIC PANEL
POTASSIUM: 3.8 MMOL/L (ref 3.5–5.1)
SODIUM: 139 MMOL/L (ref 137–147)

## 2023-08-07 LAB — MAGNESIUM: MAGNESIUM: 2 mg/dL (ref 1.6–2.6)

## 2023-08-07 MED ORDER — DIPHENHYDRAMINE HCL 25 MG PO CAP
25 mg | Freq: Once | ORAL | 0 refills | Status: CP
Start: 2023-08-07 — End: ?

## 2023-08-07 MED ORDER — DEXAMETHASONE IVPB
12 mg | Freq: Once | INTRAVENOUS | 0 refills | Status: CN
Start: 2023-08-07 — End: ?

## 2023-08-07 MED ORDER — OBINUTUZUMAB 1000MG IVPB (MAINT DOSE)
1000 mg | Freq: Once | INTRAVENOUS | 0 refills | Status: CP
Start: 2023-08-07 — End: ?

## 2023-08-07 MED ORDER — FILGRASTIM-SNDZ 480 MCG/0.8 ML IJ SYRG
5 ug/kg | Freq: Once | SUBCUTANEOUS | 0 refills | Status: CP
Start: 2023-08-07 — End: ?

## 2023-08-07 MED ORDER — DEXAMETHASONE IVPB
20 mg | Freq: Once | INTRAVENOUS | 0 refills | Status: DC
Start: 2023-08-07 — End: 2023-08-07

## 2023-08-07 MED ORDER — FAMOTIDINE 20 MG PO TAB
20 mg | Freq: Once | ORAL | 0 refills | Status: CP
Start: 2023-08-07 — End: ?

## 2023-08-07 MED ORDER — DEXAMETHASONE IVPB
12 mg | Freq: Once | INTRAVENOUS | 0 refills
Start: 2023-08-07 — End: ?

## 2023-08-07 MED ORDER — ACETAMINOPHEN 500 MG PO TAB
1000 mg | Freq: Once | ORAL | 0 refills | Status: CP
Start: 2023-08-07 — End: ?

## 2023-08-07 MED ORDER — DEXAMETHASONE IVPB
12 mg | Freq: Once | INTRAVENOUS | 0 refills | Status: CP
Start: 2023-08-07 — End: ?

## 2023-08-07 NOTE — Progress Notes
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 Cycle 1 start date was 05/12/23. Lindsey Brown confirms she is taking the medication as prescribed - 400 mg (four 100 mg tablets) by mouth once daily.     Adverse Effects and Adherence Assessment   Lindsey Brown is not experiencing any significant adverse effects to this medication regimen.     The patient's ability to self-administer medication was re-assessed. The patient has the ability to self-administer this medication.  Lindsey Brown reports missing 0 doses in the past 3 month(s) of therapy. she was re-educated on importance of adherence.    Dose Appropriateness Assessment  No renal or hepatic dose adjustment is required at this time and treatment will continue until progression or unacceptable toxicity.        CBC w diff    Lab Results   Component Value Date/Time    WBC 2.6 (L) 08/07/2023 10:18 AM    RBC 3.67 (L) 08/07/2023 10:18 AM    HGB 11.9 (L) 08/07/2023 10:18 AM    HCT 32.9 (L) 08/07/2023 10:18 AM    MCV 89.8 08/07/2023 10:18 AM    MCH 32.5 08/07/2023 10:18 AM    MCHC 36.2 (H) 08/07/2023 10:18 AM    RDW 13.6 08/07/2023 10:18 AM    PLTCT 246 08/07/2023 10:18 AM    MPV 7.7 08/07/2023 10:18 AM    Lab Results   Component Value Date/Time    NEUT 40 (L) 08/07/2023 10:18 AM    ANC 1.00 (L) 08/07/2023 10:18 AM    LYMA 40 08/07/2023 10:18 AM    ALC 1.10 08/07/2023 10:18 AM    MONA 20 (H) 08/07/2023 10:18 AM    AMC 0.50 08/07/2023 10:18 AM    EOSA 0 08/07/2023 10:18 AM    AEC 0.00 08/07/2023 10:18 AM    BASA 0 08/07/2023 10:18 AM    ABC 0.00 08/07/2023 10:18 AM        Comprehensive Metabolic Profile    Lab Results   Component Value Date/Time    NA 139 08/07/2023 10:18 AM    K 3.8 08/07/2023 10:18 AM    CL 104 08/07/2023 10:18 AM    CO2 24 08/07/2023 10:18 AM    GAP 11 08/07/2023 10:18 AM    BUN 28 (H) 08/07/2023 10:18 AM    CR 1.70 (H) 08/07/2023 10:18 AM    GLU 116 (H) 08/07/2023 10:18 AM    Lab Results   Component Value Date/Time    CA 10.4 08/07/2023 10:18 AM    PO4 4.0 08/07/2023 10:18 AM    ALBUMIN 4.8 08/07/2023 10:18 AM    TOTPROT 7.3 08/07/2023 10:18 AM    ALKPHOS 127 (H) 08/07/2023 10:18 AM    AST 19 08/07/2023 10:18 AM    ALT 32 08/07/2023 10:18 AM    TOTBILI 0.7 08/07/2023 10:18 AM    GFR >60 02/16/2020 10:55 AM    GFRAA >60 02/16/2020 10:55 AM            Serum creatinine: 1.7 mg/dL (H) 16/10/96 0454  Estimated creatinine clearance: 35.8 mL/min (A)    Medication Reconciliation and Drug/Food Interaction Assessment  A comprehensive medication reconciliation was performed for Lindsey Brown. her medication list was updated to reflect any identified changes and the patient?s current medication list is included below. Lindsey Brown was instructed to speak with her healthcare provider and/or the oral chemotherapy pharmacist before starting any new  drug, including, but not limited to, prescription, over the counter, natural / herbal products, or vitamins and supplements.     Drug-drug and drug-food interactions were assessed between the patients? specialty medication(s) and her most current medication list. No significant drug-drug interactions were identified.     Home Medications    Medication Sig   acyclovir (ZOVIRAX) 400 mg tablet Take one tablet by mouth every 12 hours.   ALPRAZolam (XANAX) 0.25 mg tablet Take one tablet by mouth twice daily as needed for Sleep or Anxiety.   amLODIPine (NORVASC) 10 mg tablet Take one tablet by mouth daily. Indications: high blood pressure   atorvastatin (LIPITOR) 10 mg tablet TAKE 1 TABLET BY MOUTH ONCE DAILY FOR 90 DAYS   celecoxib (CELEBREX) 100 mg capsule Take one capsule by mouth twice daily.   cyanocobalamin (vitamin B-12) 500 mcg tablet Take one tablet by mouth daily.   diazePAM (VALIUM) 5 mg tablet Take  by mouth.   ergocalciferol (vitamin D2) (VITAMIN D PO) Take 1 tablet by mouth daily.   ondansetron (ZOFRAN ODT) 4 mg rapid dissolve tablet Dissolve one tablet by mouth every 6 hours as needed. Place on tongue to dissolve.  Indications: nausea and vomiting   pantoprazole DR (PROTONIX) 40 mg tablet Take one tablet by mouth daily. In Am on an empty stomach   prochlorperazine maleate (COMPAZINE) 10 mg tablet Take one tablet by mouth every 6 hours as needed for Nausea or Vomiting.   venetoclax (VENCLEXTA) 100 mg tablet Take four tablets by mouth daily. Do not begin taking until starter pack is completed.  Take with food.       Allergies   Allergen Reactions    Pcn [Penicillins] HIVES     03/31/2023 patient reports developing hives when she was 17. Tolerates amoxicillin and cephalosporins.    Obinutuzumab FLUSHING (SKIN), NAUSEA ONLY and SEE COMMENTS     Hypertension        Response to Therapy Assessment  The electronic medical record for Lindsey Brown has been reviewed. The patient?s provider has determined this therapy is appropriate to continue at this time. The patient will continue this therapy as she is achieving therapeutic benefit.     Immunization Assessment   Immunization History   Administered Date(s) Administered    COVID-19 (MODERNA), mRNA vacc, 100 mcg/0.5 mL (PF) 02/23/2020, 03/22/2020, 09/07/2020, 07/02/2021    Covid-19 Bivalent (76yr+)(MODERNA), mRNA Vacc, 50 mcg/0.5 mL (PF) 12/02/2021    Covid-19 mRNA Vaccine >=12yo (Moderna)(2023-24 formula)(Spikevax) 09/15/2022    Flu Vaccine =>6 Months Trivalent PF 10/12/2013    Flu Vaccine =>65 YO High-Dose (PF) 10/18/2020, 10/10/2021    Influenza Vaccine Whole 09/18/2008       Vaccine history and recommended vaccinations were reviewed and will be recommended as appropriate. The patient will be reminded about the importance of receiving an annual influenza vaccine as indicated.      Safety Assessment  Risk Evaluation and Mitigation Strategy (REMS)  No REMS is required for this medication.    Reproductive Risk  Lindsey Brown is a 67 y.o. female. As patient is a female not of child-bearing potential, education was not applicable.     Handling and Disposal  Appropriate safe handling and disposal procedures were reviewed with the patient. Lindsey Brown was instructed to return any unused or expired oral chemotherapy medication to a designated disposal bin at one of the [facility] locations or to utilize a community drug take back program. Instructed not to flush down the toilet or  to crush the medication    Follow-up Plan   Lindsey Brown was encouraged to call the oral chemotherapy pharmacist at [phone] with questions. This medication is considered medium risk per our internal oral chemotherapy risk categorization. This re-assessment has been completed and the next reassessment planned for 12 months.    Doristine Bosworth, [facility]  Oncology Clinical Pharmacist  08/07/2023

## 2023-08-07 NOTE — Progress Notes
Cancer Therapeutics Note  Verified consent signed and in chart.    Blood return positive via: Peripheral (22 ga)    BSA and dose double checked (agree with orders as written) with: yes see MAR    Labs/applicable tests checked: CBC and Comprehensive Metabolic Panel (CMP)    Treatment regimen: Drug/cycle/day: C5D1   obinutuzumab (GAZYVA) 1,000 mg in sodium chloride 0.9% (NS) 290 mL (*) IVPB    Rate verified and armband double check with second RN: yes    Patient education offered and stated understanding. Denies questions at this time.    Patient arrived to CC treatment for C5D1 of Lindsey Brown and Zarxio. Patient has no question, concerns or complaints at this time. 1510 Pt received treatment as ordered without incidences. Pt's PAC was flushed and deaccessed per protocol. Pt left ambulatory with no further questions.

## 2023-08-07 NOTE — Progress Notes
Name: Lindsey Brown          MRN: [id number]      DOB: October 12, 1956      AGE: 67 y.o.   DATE OF SERVICE: 08/07/2023    Subjective:               Reason for Visit: Follow-up    Lindsey Brown is a 67 y.o. female.      Cancer Staging   CLL (chronic lymphocytic leukemia) (HCC)  Staging form: Chronic Lymphocytic Leukemia / Small Lymphocytic Lymphoma, AJCC 8th Edition  - Clinical stage from 02/05/2023: Modified Rai Stage III (Modified Rai risk: High, Lymphocytosis: Present, Adenopathy: Present, Organomegaly: Absent, Anemia: Present, Thrombocytopenia: Absent) - Signed by Violeta Gelinas, MD on 02/05/2023      Lindsey Brown presents today in hematologic consultation for management of CLL at the request of Dr. Angelyn Punt.    She has the following detailed history:  1.  Presented in January 2021 with palpable adenopathy for at least 2 years accompanied by peripheral blood lymphocytosis.  She was referred to Dr. Angelyn Punt.  2.  01/26/2020 bone marrow biopsy showed 60% involvement with CLL.  There was not much surface immunoglobulin, but a suggestion of mild kappa restriction.  CLL FISH panel was unremarkable.  Conventional karyotype was diploid.  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  3.  She developed worsening renal insufficiency and workup that showed no evidence of circulating paraprotein or abnormal free light chain ratios.  Her 24-hour urine revealed kappa free light chain on immunofixation.  Renal biopsy is pending.  4. Obinutuzumab will be initiated on 04/16/2023 with plans to initiate venetoclax in May 2024.      [place] Married to husband, [name].    Interim History 08/07/2023:    Lindsey Brown returns today with her husband, [name], to begin Cycle 5 Day 1 obinutuzumab.   She experienced nausea, emesis (x4) and diarrhea (x4) overnight earlier this week on 08/03/2023. No other family had similar symptoms. Home COVID test was negative.  Symptoms resolved on 08/04/2023.  Has not been taking Protonix prior to this episode, but has since resumed taking daily.   Appetite has been normal. No recurrent emesis or diarrhea.    Had bone pain with filgrastim injections with last cycle. Took Claritin and Tylenol. Did not need to start Celebrex.     Taking venetoclax 400 mg daily. No missed doses. Just received a new refill.     Shortness of breath has resolved. Fatigue is stable. No cough  or respiratory symptoms.  Minimal edema.   Denies new adenopathy.        Review of Systems   Constitutional:  Positive for fatigue. Negative for activity change, appetite change, chills, diaphoresis, fever and unexpected weight change.   HENT:  Negative for congestion, trouble swallowing and voice change.    Respiratory:  Negative for cough and shortness of breath.    Gastrointestinal:  Positive for nausea. Negative for abdominal distention, abdominal pain, blood in stool, constipation, diarrhea and vomiting.   Genitourinary:  Negative for flank pain and hematuria.   Skin:  Negative for rash.   Neurological:  Positive for weakness. Negative for dizziness, light-headedness, numbness and headaches.   Hematological:  Negative for adenopathy. Does not bruise/bleed easily.   All other systems reviewed and are negative.    Objective:          acyclovir (ZOVIRAX) 400 mg tablet Take one tablet by mouth every 12 hours.  ALPRAZolam (XANAX) 0.25 mg tablet Take one tablet by mouth twice daily as needed for Sleep or Anxiety.    amLODIPine (NORVASC) 10 mg tablet Take one tablet by mouth daily. Indications: high blood pressure    atorvastatin (LIPITOR) 10 mg tablet TAKE 1 TABLET BY MOUTH ONCE DAILY FOR 90 DAYS    celecoxib (CELEBREX) 100 mg capsule Take one capsule by mouth twice daily.    cyanocobalamin (vitamin B-12) 500 mcg tablet Take one tablet by mouth daily.    diazePAM (VALIUM) 5 mg tablet Take  by mouth.    ergocalciferol (vitamin D2) (VITAMIN D PO) Take 1 tablet by mouth daily.    ondansetron (ZOFRAN ODT) 4 mg rapid dissolve tablet Dissolve one tablet by mouth every 6 hours as needed. Place on tongue to dissolve.  Indications: nausea and vomiting    pantoprazole DR (PROTONIX) 40 mg tablet Take one tablet by mouth daily. In Am on an empty stomach    prochlorperazine maleate (COMPAZINE) 10 mg tablet Take one tablet by mouth every 6 hours as needed for Nausea or Vomiting.    venetoclax (VENCLEXTA) 100 mg tablet Take four tablets by mouth daily. Do not begin taking until starter pack is completed.  Take with food.       Vitals:    08/07/23 1038   BP: (!) 154/90  Comment: PT DIDN'T TAKE BP MED TODAY   BP Source: Arm, Right Upper   Pulse: 105   Temp: 36.4 ?C (97.6 ?F)   Resp: 16   SpO2: 100%   TempSrc: Temporal   PainSc: Zero   Weight: 78.1 kg (172 lb 3.2 oz)       Body mass index is 26.19 kg/m?Marland Kitchen     Pain Score: Zero       Fatigue Scale: 6    Pain Addressed:  N/A    Patient Evaluated for a Clinical Trial: No treatment clinical trial available for this patient.     [place] Cooperative Oncology Group performance status is 0, Fully active, able to carry on all pre-disease performance without restriction.     Physical Exam  Vitals and nursing note reviewed.   Constitutional:       General: She is not in acute distress.     Appearance: Normal appearance. She is well-developed. She is not ill-appearing or diaphoretic.   HENT:      Head: Normocephalic.      Nose: Nose normal. No congestion.      Mouth/Throat:      Pharynx: Oropharynx is clear. No oropharyngeal exudate or posterior oropharyngeal erythema.   Eyes:      General: No scleral icterus.     Conjunctiva/sclera: Conjunctivae normal.   Cardiovascular:      Rate and Rhythm: Normal rate and regular rhythm.      Pulses: Normal pulses.      Heart sounds: Normal heart sounds. No murmur heard.  Pulmonary:      Effort: Pulmonary effort is normal. No respiratory distress.      Breath sounds: Normal breath sounds. No decreased breath sounds.   Abdominal:      General: Bowel sounds are normal.      Palpations: Abdomen is soft. There is no hepatomegaly or splenomegaly.      Tenderness: There is no abdominal tenderness.      Comments:   Liver and spleen are nonpalpable.   Musculoskeletal:         General: Normal range of motion.  Cervical back: Neck supple.      Right lower leg: No edema.      Left lower leg: No edema.   Lymphadenopathy:      Head:      Right side of head: No submental, submandibular, tonsillar, preauricular, posterior auricular or occipital adenopathy.      Left side of head: No submental, submandibular, tonsillar, preauricular, posterior auricular or occipital adenopathy.      Cervical: No cervical adenopathy.      Right cervical: No superficial cervical adenopathy.     Upper Body:      Right upper body: No supraclavicular or axillary adenopathy.      Left upper body: No supraclavicular or axillary adenopathy.      Lower Body: No right inguinal adenopathy. No left inguinal adenopathy.      Comments:   There is no palpable cervical, axillary, or inguinal lymphadenopathy.    Skin:     General: Skin is warm and dry.      Capillary Refill: Capillary refill takes less than 2 seconds.      Coloration: Skin is not jaundiced or pale.      Findings: No rash.   Neurological:      Mental Status: She is alert and oriented to person, place, and time. Mental status is at baseline.   Psychiatric:         Mood and Affect: Mood normal.         Behavior: Behavior normal.               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 She has no palpable lymphadenopathy or organomegaly on exam. She had a brief gastroenteritis that was self-limiting and does not appear to be therapy-related. Encouraged that she continue pantoprazole 40 mg every morning on an empty stomach.   Recommend taking Zofran 30 mg before each dose of venetoclax. She has Compazine as needed for recurrent nausea.  Blood cell counts obtained today on 08/07/2023 show improved mild anemia, normal platelet count, mild neutropenia, and normal ALC.  Administer filgrastim injection today for ANC of 1,000 = 08/07/2023. Will order pegfilgrastim for future growth-factor needs. She has Claritin and Celebrex prescription if needed for arthralgias.  Proceed with C5 D1 obintutuzumab 1000 mg IV = 08/07/2023.  Continue venetoclax 400 mg daily.   Continue acyclovir 400 mg p.o. twice daily. This is dose adjusted due to her renal dysfunction.   Creatinine remains persistently elevated, but stable at 1.70. Continue to follow with Dr. 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 Anticipate lower extremity edema and renal function will improve with initiation of therapy.     Follow-up: RTC with Dr. Paulene Floor in 1 month, or sooner should new or concerning issues arise.    Patient agrees with plan of care and verbalizes understanding. Questions and concerns were addressed to patient's satisfaction. Our direct clinic number and 24-hour [facility] number provided to patient on after visit summary.     Kavin Leech, APRN-NP  Nurse Practitioner with Dr. Olene Craven and Dr. Lelon Frohlich  Division of Hematology and  Oncology  Pager [phone] or available on Voalte

## 2023-08-12 ENCOUNTER — Encounter: Admit: 2023-08-12 | Discharge: 2023-08-12 | Payer: MEDICARE

## 2023-08-13 ENCOUNTER — Encounter: Admit: 2023-08-13 | Discharge: 2023-08-13 | Payer: MEDICARE

## 2023-08-14 ENCOUNTER — Encounter: Admit: 2023-08-14 | Discharge: 2023-08-14 | Payer: MEDICARE

## 2023-08-26 ENCOUNTER — Encounter: Admit: 2023-08-26 | Discharge: 2023-08-26 | Payer: MEDICARE

## 2023-08-27 ENCOUNTER — Encounter: Admit: 2023-08-27 | Discharge: 2023-08-27 | Payer: MEDICARE

## 2023-08-27 MED FILL — VENETOCLAX 100 MG PO TAB: 100 mg | ORAL | 30 days supply | Qty: 120 | Fill #4 | Status: AC

## 2023-08-28 ENCOUNTER — Encounter: Admit: 2023-08-28 | Discharge: 2023-08-28 | Payer: MEDICARE

## 2023-08-31 ENCOUNTER — Encounter: Admit: 2023-08-31 | Discharge: 2023-08-31 | Payer: MEDICARE

## 2023-09-01 ENCOUNTER — Encounter: Admit: 2023-09-01 | Discharge: 2023-09-01 | Payer: MEDICARE

## 2023-09-01 DIAGNOSIS — M1990 Unspecified osteoarthritis, unspecified site: Secondary | ICD-10-CM

## 2023-09-01 DIAGNOSIS — C9110 Chronic lymphocytic leukemia of B-cell type not having achieved remission: Secondary | ICD-10-CM

## 2023-09-01 DIAGNOSIS — R011 Cardiac murmur, unspecified: Secondary | ICD-10-CM

## 2023-09-01 DIAGNOSIS — C9590 Leukemia, unspecified not having achieved remission: Secondary | ICD-10-CM

## 2023-09-01 DIAGNOSIS — N289 Disorder of kidney and ureter, unspecified: Secondary | ICD-10-CM

## 2023-09-01 DIAGNOSIS — D701 Agranulocytosis secondary to cancer chemotherapy: Secondary | ICD-10-CM

## 2023-09-01 DIAGNOSIS — F419 Anxiety disorder, unspecified: Secondary | ICD-10-CM

## 2023-09-01 DIAGNOSIS — D539 Nutritional anemia, unspecified: Secondary | ICD-10-CM

## 2023-09-01 DIAGNOSIS — I10 Essential (primary) hypertension: Secondary | ICD-10-CM

## 2023-09-01 DIAGNOSIS — T7840XA Allergy, unspecified, initial encounter: Secondary | ICD-10-CM

## 2023-09-01 DIAGNOSIS — J189 Pneumonia, unspecified organism: Secondary | ICD-10-CM

## 2023-09-01 LAB — MAGNESIUM: MAGNESIUM: 1.9 mg/dL (ref 1.6–2.6)

## 2023-09-01 LAB — COMPREHENSIVE METABOLIC PANEL
ALBUMIN: 4.8 g/dL (ref 3.5–5.0)
ALK PHOSPHATASE: 120 U/L — ABNORMAL HIGH (ref 25–110)
AST: 25 U/L (ref 7–40)
BLD UREA NITROGEN: 25 mg/dL (ref 7–25)
CALCIUM: 10 mg/dL (ref 8.5–10.6)
CHLORIDE: 105 MMOL/L (ref 98–110)
CO2: 24 MMOL/L (ref 21–30)
CREATININE: 1.5 mg/dL — ABNORMAL HIGH (ref 0.4–1.00)
EGFR: 36 mL/min — ABNORMAL LOW (ref >60)
GLUCOSE,PANEL: 111 mg/dL — ABNORMAL HIGH (ref 70–100)
POTASSIUM: 3.9 MMOL/L (ref 3.5–5.1)
SODIUM: 141 MMOL/L (ref 137–147)
TOTAL BILIRUBIN: 0.9 mg/dL (ref 0.2–1.3)
TOTAL PROTEIN: 7.5 g/dL (ref 6.0–8.0)

## 2023-09-01 LAB — CBC AND DIFF
ABSOLUTE BASO COUNT: 0 10*3/uL (ref 0–0.20)
ABSOLUTE EOS COUNT: 0 10*3/uL (ref 0–0.45)
ABSOLUTE LYMPH COUNT: 0.8 10*3/uL — ABNORMAL LOW (ref 1.0–4.8)
ABSOLUTE MONO COUNT: 0.6 10*3/uL (ref 0–0.80)
ABSOLUTE NEUTROPHIL: 0.6 10*3/uL — ABNORMAL LOW (ref 1.8–7.0)
BASOPHILS %: 1 % (ref 0–2)
EOSINOPHILS %: 0 % (ref 0–5)
HEMATOCRIT: 31 % — ABNORMAL LOW (ref 36–45)
HEMOGLOBIN: 11 g/dL — ABNORMAL LOW (ref 12.0–15.0)
LYMPHOCYTES %: 40 % (ref 24–44)
MCH: 32 pg (ref 26–34)
MCHC: 36 g/dL — ABNORMAL HIGH (ref 32.0–36.0)
MCV: 89 FL (ref 80–100)
MONOCYTES %: 30 % — ABNORMAL HIGH (ref 4–12)
MPV: 8.4 FL (ref 7–11)
NEUTROPHILS %: 29 % — ABNORMAL LOW (ref 41–77)
PLATELET COUNT: 216 10*3/uL (ref 150–400)
RBC COUNT: 3.5 M/UL — ABNORMAL LOW (ref 4.0–5.0)
RDW: 14 % (ref 11–15)
WBC COUNT: 2.1 10*3/uL — ABNORMAL LOW (ref 4.5–11.0)

## 2023-09-01 LAB — URIC ACID: URIC ACID: 5.2 mg/dL (ref 2.0–7.0)

## 2023-09-01 LAB — PHOSPHORUS: PHOSPHORUS: 4 mg/dL (ref 2.0–4.5)

## 2023-09-01 MED ORDER — DEXAMETHASONE IVPB
12 mg | Freq: Once | INTRAVENOUS | 0 refills | Status: CP
Start: 2023-09-01 — End: ?
  Administered 2023-09-01 (×2): 12 mg via INTRAVENOUS

## 2023-09-01 MED ORDER — FAMOTIDINE 20 MG PO TAB
20 mg | Freq: Once | ORAL | 0 refills | Status: CP
Start: 2023-09-01 — End: ?
  Administered 2023-09-01: 17:00:00 20 mg via ORAL

## 2023-09-01 MED ORDER — OBINUTUZUMAB 1000MG IVPB (MAINT DOSE)
1000 mg | Freq: Once | INTRAVENOUS | 0 refills | Status: CP
Start: 2023-09-01 — End: ?
  Administered 2023-09-01 (×2): 1000 mg via INTRAVENOUS

## 2023-09-01 MED ORDER — ACETAMINOPHEN 500 MG PO TAB
1000 mg | Freq: Once | ORAL | 0 refills | Status: CP
Start: 2023-09-01 — End: ?
  Administered 2023-09-01: 17:00:00 1000 mg via ORAL

## 2023-09-01 MED ORDER — PEGFILGRASTIM-JMDB 6 MG/0.6 ML SC SYRG
6 mg | Freq: Once | SUBCUTANEOUS | 0 refills | Status: CP
Start: 2023-09-01 — End: ?
  Administered 2023-09-01: 19:00:00 6 mg via SUBCUTANEOUS

## 2023-09-01 MED ORDER — DIPHENHYDRAMINE HCL 25 MG PO CAP
25 mg | Freq: Once | ORAL | 0 refills | Status: CP
Start: 2023-09-01 — End: ?
  Administered 2023-09-01: 17:00:00 25 mg via ORAL

## 2023-09-01 NOTE — Progress Notes
Patient arrived to CC treatment after being seen in clinic with Dr. Paulene Brown; please refer to clinic note for assessment details. Premeds given per treatment plan. C6D1 Gazyva given w/o complications, patient tolerated well. PIV removed per protocol. All questions and concerns addressed. Patient left CC treatment w/ all personal belongings in stable condition.    Cancer Therapeutics Note  Verified consent signed and in chart.    Blood return positive via: Peripheral (22 ga)    BSA and dose double checked (agree with orders as written) with: yes, see MAR    Labs/applicable tests checked: CBC and Comprehensive Metabolic Panel (CMP)    Treatment regimen: Drug/cycle/day Lindsey Brown  obinutuzumab (GAZYVA) 1,000 mg in sodium chloride 0.9% (NS) 290 mL (*) IVPB     Rate verified and armband double check with second RN: yes, see MAR    Patient education offered and stated understanding. Denies questions at this time.

## 2023-09-01 NOTE — Progress Notes
Name: Lindsey Brown          MRN: [id number]      DOB: 09/30/1956      AGE: 67 y.o.   DATE OF SERVICE: 09/01/2023    Subjective:             Reason for Visit:  Follow Up      Lindsey Brown is a 67 y.o. female.      Cancer Staging   CLL (chronic lymphocytic leukemia) (HCC)  Staging form: Chronic Lymphocytic Leukemia / Small Lymphocytic Lymphoma, AJCC 8th Edition  - Clinical stage from 02/05/2023: Modified Rai Stage III (Modified Rai risk: High, Lymphocytosis: Present, Adenopathy: Present, Organomegaly: Absent, Anemia: Present, Thrombocytopenia: Absent) - Signed by Violeta Gelinas, MD on 02/05/2023      Lindsey Brown presents today in hematologic consultation for management of CLL at the request of Dr. Angelyn Punt.     She has the following detailed history:  1.  Presented in January 2021 with palpable adenopathy for at least 2 years accompanied by peripheral blood lymphocytosis.  She was referred to Dr. Angelyn Punt.  2.  01/26/2020 bone marrow biopsy showed 60% involvement with CLL.  There was not much surface immunoglobulin, but a suggestion of mild kappa restriction.  CLL FISH panel was unremarkable.  Conventional karyotype was diploid.  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  3.  She developed worsening renal insufficiency and workup that showed no evidence of circulating paraprotein or abnormal free light chain ratios.  Her 24-hour urine revealed kappa free light chain on immunofixation.  Renal biopsy shows deposition of IgG kappa restriction, c/w injury from CLL.  4.  Peripheral blood FISH showed no abnormalities.  Peripheral blood NGS had no significant abnormalities.  She did have CloneSeq probes sent and has acceptable sequences for molecular MRD testing.  5.  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  6.  04/15/2023 peripheral blood FISH showed no abnormalities.  NGS is pending.  7.  April 2024 initiated venetoclax and obinutuzumab off study.    Interim History:  Pretty good.  Taking venetoclax 400 mg PO daily.  No significant nausea.  Taking pantoprazole.  Her lower extremity edema remains resolved.    Lymphadenopathy has resolved completely.  Night sweats resolved.    I have reviewed and updated the past medical, social and family histories in the history section and they are up to date as of this visit.    I have extensively reviewed the laboratory, pathology and radiology, both internal and external, and the key findings are summarized above.         Review of Systems   All other systems reviewed and are negative.      Objective:          acyclovir (ZOVIRAX) 400 mg tablet Take one tablet by mouth every 12 hours.    ALPRAZolam (XANAX) 0.25 mg tablet Take one tablet by mouth twice daily as needed for Sleep or Anxiety.    amLODIPine (NORVASC) 10 mg tablet Take one tablet by mouth daily. Indications: high blood pressure    atorvastatin (LIPITOR) 10 mg tablet TAKE 1 TABLET BY MOUTH ONCE DAILY FOR 90 DAYS    celecoxib (CELEBREX) 100 mg capsule Take one capsule by mouth twice daily.    cyanocobalamin (vitamin B-12) 500 mcg tablet Take one tablet by mouth daily.    diazePAM (VALIUM) 5 mg tablet Take  by mouth.  ergocalciferol (vitamin D2) (VITAMIN D PO) Take 1 tablet by mouth daily.    ondansetron (ZOFRAN ODT) 4 mg rapid dissolve tablet Dissolve one tablet by mouth every 6 hours as needed. Place on tongue to dissolve.  Indications: nausea and vomiting    pantoprazole DR (PROTONIX) 40 mg tablet Take one tablet by mouth daily. In Am on an empty stomach    prochlorperazine maleate (COMPAZINE) 10 mg tablet Take one tablet by mouth every 6 hours as needed for Nausea or Vomiting.    venetoclax (VENCLEXTA) 100 mg tablet Take four tablets by mouth daily. Do not begin taking until starter pack is completed.  Take with food.       Vitals:    09/01/23 0936   BP: (!) 143/83   BP Source: Arm, Right Upper   Pulse: 100   Temp: 36.6 ?C (97.9 ?F)   Resp: 16   SpO2: 100%   TempSrc: Temporal   PainSc: Zero   Weight: 80.9 kg (178 lb 6.4 oz)     Body mass index is 27.13 kg/m?Marland Kitchen     Pain Score: Zero       Fatigue Scale: 5    Pain Addressed:  N/A    Patient Evaluated for a Clinical Trial: Patient not eligible for a treatment trial (including not needing treatment, needs palliative care, in remission).     [place] Cooperative Oncology Group performance status is 1, Restricted in physically strenuous activity but ambulatory and able to carry out work of a light or sedentary nature, e.g., light house work, office work.       Physical Exam  Vitals and nursing note reviewed.   Constitutional:       Appearance: She is well-developed.   HENT:      Head: Normocephalic.   Eyes:      General: No scleral icterus.     Conjunctiva/sclera: Conjunctivae normal.   Cardiovascular:      Rate and Rhythm: Normal rate and regular rhythm.   Pulmonary:      Effort: Pulmonary effort is normal. No respiratory distress.   Abdominal:      Palpations: Abdomen is soft.   Musculoskeletal:      Cervical back: Neck supple.      Right lower leg: No edema.      Left lower leg: No edema.   Lymphadenopathy:      Comments:   Left cervical node resolved.  Liver and spleen nonpalpable   Skin:     Findings: No rash.   Neurological:      Mental Status: She is alert.               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  She has received filgrastim on a couple of occasions and has persistent and recurrent neutropenia.  Pegfilgrastim is very clearly indicated.  Pegfilgrastim as soon as possible.  Will monitor her monthly and give for an ANC less than 1000 moving forward.  She would like to receive this at the [facility] location for her convenience.  CLL remains dramatically improved.  Her renal function does continue to improve but her creatinine improvement has lag  behind normalization of her albumin and other parameters.  Continue pantoprazole 40 mg PO daily.  Nausea much better.  Continue venetoclax 400 mg PO daily.  Continue obinutuzumab 1000 mg IV with C6 D1 = 09/01/2023.  Continue acyclovir 400 mg PO twice daily  She has acceptable CloneSeq probes for MRD testing at completion of therapy.  Repeat 24-hour urine in 1 month.    RTC with me in 3 months, or sooner should new or concerning issues arise.

## 2023-09-02 ENCOUNTER — Ambulatory Visit: Admit: 2023-09-02 | Discharge: 2023-09-03 | Payer: MEDICARE

## 2023-09-02 ENCOUNTER — Encounter: Admit: 2023-09-02 | Discharge: 2023-09-02 | Payer: MEDICARE

## 2023-09-15 ENCOUNTER — Encounter: Admit: 2023-09-15 | Discharge: 2023-09-15 | Payer: MEDICARE

## 2023-09-17 ENCOUNTER — Encounter: Admit: 2023-09-17 | Discharge: 2023-09-17 | Payer: MEDICARE

## 2023-09-17 DIAGNOSIS — C9110 Chronic lymphocytic leukemia of B-cell type not having achieved remission: Secondary | ICD-10-CM

## 2023-09-17 MED ORDER — VENCLEXTA 100 MG PO TAB
400 mg | ORAL_TABLET | Freq: Every day | ORAL | 3 refills
Start: 2023-09-17 — End: ?

## 2023-09-18 ENCOUNTER — Encounter: Admit: 2023-09-18 | Discharge: 2023-09-18 | Payer: MEDICARE

## 2023-09-21 ENCOUNTER — Encounter: Admit: 2023-09-21 | Discharge: 2023-09-21 | Payer: MEDICARE

## 2023-09-22 ENCOUNTER — Encounter: Admit: 2023-09-22 | Discharge: 2023-09-22 | Payer: MEDICARE

## 2023-09-22 DIAGNOSIS — C9110 Chronic lymphocytic leukemia of B-cell type not having achieved remission: Secondary | ICD-10-CM

## 2023-09-22 MED ORDER — VENETOCLAX 100 MG PO TAB
400 mg | ORAL_TABLET | Freq: Every day | ORAL | 3 refills | 30.00000 days | Status: AC
Start: 2023-09-22 — End: ?
  Filled 2023-09-24: qty 120, 30d supply, fill #1

## 2023-09-22 NOTE — Progress Notes
Name:Lindsey Brown           MRN: [id number]                 DOB:[birth year]          Age: 67 y.o.  Date of Service: 09/22/2023      Oral Chemotherapy Refill Review     Patient Information:     Correct patient/DOB/Diagnosis: Yes    Reviewed most recent clinic notes for changes in plan: Yes    Most recent labs reviewed: Yes    Appointment Information:   Date of last appointment: 09/01/2023    Follow up appointment scheduled : Yes 12/08/2023      Medication Information:     Medication name: venetoclax    Medication in [place] Plan: Yes     Date of last refill released from [facility]: 09/01/2023    Weight change >10%: No    Has a dose adjustment been made since last refill?No    Is Pharmacy listed in [place] treatment plan? Yes    Pharmacy: [facility]    Consent Date 04/08/2023    Patient Contact  Method of communication: telephone  Confirmed need for refill: Yes  Patient taking medication as directed Yes  Confirmation of receiving pharmacy: Yes  Patient is anticipating a start date of: continuation of therapy    Other Information:     Plan was routed to Erby Pian, APRN

## 2023-09-23 ENCOUNTER — Encounter: Admit: 2023-09-23 | Discharge: 2023-09-23 | Payer: MEDICARE

## 2023-09-24 ENCOUNTER — Encounter: Admit: 2023-09-24 | Discharge: 2023-09-24 | Payer: MEDICARE

## 2023-09-29 ENCOUNTER — Encounter: Admit: 2023-09-29 | Discharge: 2023-09-29 | Payer: MEDICARE

## 2023-09-29 DIAGNOSIS — D701 Agranulocytosis secondary to cancer chemotherapy: Secondary | ICD-10-CM

## 2023-09-29 LAB — COMPREHENSIVE METABOLIC PANEL
ALBUMIN: 4.7 g/dL (ref 3.5–5.0)
ALK PHOSPHATASE: 139 U/L — ABNORMAL HIGH (ref 25–110)
ALT: 31 U/L (ref 7–56)
ANION GAP: 11 (ref 3–12)
AST: 19 U/L (ref 7–40)
BLD UREA NITROGEN: 28 mg/dL — ABNORMAL HIGH (ref 7–25)
CALCIUM: 9.7 mg/dL (ref 8.5–10.6)
CHLORIDE: 106 MMOL/L (ref 98–110)
CO2: 25 MMOL/L (ref 21–30)
CREATININE: 1.6 mg/dL — ABNORMAL HIGH (ref 0.4–1.00)
EGFR: 35 mL/min — ABNORMAL LOW (ref >60)
GLUCOSE,PANEL: 133 mg/dL — ABNORMAL HIGH (ref 70–100)
POTASSIUM: 3.6 MMOL/L (ref 3.5–5.1)
SODIUM: 142 MMOL/L (ref 137–147)
TOTAL BILIRUBIN: 0.9 mg/dL (ref 0.2–1.3)
TOTAL PROTEIN: 6.9 g/dL (ref 6.0–8.0)

## 2023-09-29 LAB — CBC AND DIFF
ABSOLUTE BASO COUNT: 0 10*3/uL (ref 0–0.20)
ABSOLUTE EOS COUNT: 0 10*3/uL (ref 0–0.45)
ABSOLUTE LYMPH COUNT: 1.2 10*3/uL (ref 1.0–4.8)
ABSOLUTE MONO COUNT: 0.9 10*3/uL — ABNORMAL HIGH (ref 0–0.80)
ABSOLUTE NEUTROPHIL: 1.8 10*3/uL (ref 1.8–7.0)
BASOPHILS %: 0 % (ref 0–2)
EOSINOPHILS %: 0 % (ref 0–5)
HEMATOCRIT: 31 % — ABNORMAL LOW (ref 36–45)
HEMOGLOBIN: 11 g/dL — ABNORMAL LOW (ref 12.0–15.0)
LYMPHOCYTES %: 31 % (ref 24–44)
MCH: 33 pg (ref 26–34)
MCHC: 35 g/dL (ref 32.0–36.0)
MCV: 92 FL (ref 80–100)
MONOCYTES %: 24 % — ABNORMAL HIGH (ref 4–12)
MPV: 7.3 FL (ref 7–11)
NEUTROPHILS %: 45 % (ref 41–77)
PLATELET COUNT: 224 10*3/uL (ref 150–400)
RBC COUNT: 3.4 M/UL — ABNORMAL LOW (ref 4.0–5.0)
RDW: 15 % — ABNORMAL HIGH (ref 11–15)
WBC COUNT: 4 10*3/uL — ABNORMAL LOW (ref 4.5–11.0)

## 2023-09-29 NOTE — Progress Notes
Pt here for Fulphila injection. Labs reviewed, ANC 1.8 today. Fulphila held per orders. 24 hour urine kit given and instructions provided. Pt discharged in stable condition. To return as scheduled.

## 2023-09-30 ENCOUNTER — Encounter: Admit: 2023-09-30 | Discharge: 2023-09-30 | Payer: MEDICARE

## 2023-09-30 DIAGNOSIS — C9110 Chronic lymphocytic leukemia of B-cell type not having achieved remission: Secondary | ICD-10-CM

## 2023-10-01 LAB — TOTAL PROTEIN-URINE 24 HR
TOTAL PROTEIN 24HR: 550 mg/(24.h) — ABNORMAL HIGH (ref 50–150)
UR TOTAL PROTEIN,RAN: 44 mg/dL

## 2023-10-13 ENCOUNTER — Encounter: Admit: 2023-10-13 | Discharge: 2023-10-13 | Payer: MEDICARE

## 2023-10-15 ENCOUNTER — Encounter: Admit: 2023-10-15 | Discharge: 2023-10-15 | Payer: MEDICARE

## 2023-10-16 ENCOUNTER — Encounter: Admit: 2023-10-16 | Discharge: 2023-10-16 | Payer: MEDICARE

## 2023-10-16 MED FILL — VENETOCLAX 100 MG PO TAB: 100 mg | ORAL | 30 days supply | Qty: 120 | Fill #2 | Status: AC

## 2023-10-22 ENCOUNTER — Encounter: Admit: 2023-10-22 | Discharge: 2023-10-22 | Payer: MEDICARE

## 2023-10-27 ENCOUNTER — Encounter: Admit: 2023-10-27 | Discharge: 2023-10-27 | Payer: MEDICARE

## 2023-10-27 ENCOUNTER — Encounter: Admit: 2023-10-27 | Discharge: 2023-10-28 | Payer: MEDICARE

## 2023-10-27 DIAGNOSIS — D701 Agranulocytosis secondary to cancer chemotherapy: Secondary | ICD-10-CM

## 2023-10-27 MED ORDER — PEGFILGRASTIM-JMDB 6 MG/0.6 ML SC SYRG
6 mg | Freq: Once | SUBCUTANEOUS | 0 refills | Status: DC
Start: 2023-10-27 — End: 2023-10-27

## 2023-11-03 ENCOUNTER — Encounter: Admit: 2023-11-03 | Discharge: 2023-11-03 | Payer: MEDICARE

## 2023-11-06 ENCOUNTER — Encounter: Admit: 2023-11-06 | Discharge: 2023-11-06 | Payer: MEDICARE

## 2023-11-13 ENCOUNTER — Encounter: Admit: 2023-11-13 | Discharge: 2023-11-13 | Payer: MEDICARE

## 2023-11-13 MED FILL — VENETOCLAX 100 MG PO TAB: 100 mg | ORAL | 30 days supply | Qty: 120 | Fill #3 | Status: AC

## 2023-11-14 ENCOUNTER — Encounter: Admit: 2023-11-14 | Discharge: 2023-11-14 | Payer: MEDICARE

## 2023-11-24 ENCOUNTER — Encounter: Admit: 2023-11-24 | Discharge: 2023-11-24 | Payer: MEDICARE

## 2023-11-24 DIAGNOSIS — D701 Agranulocytosis secondary to cancer chemotherapy: Secondary | ICD-10-CM

## 2023-11-24 NOTE — Progress Notes
Pt here for C3D1 Fulphila. Labs reviewed, treatment parameters not met, ANC 1.6 today. Reviewed treatment plan with pt. Pt discharged in stable condition. To return as scheduled.

## 2023-11-25 ENCOUNTER — Encounter: Admit: 2023-11-25 | Discharge: 2023-11-25 | Payer: MEDICARE

## 2023-11-25 DIAGNOSIS — C9110 Chronic lymphocytic leukemia of B-cell type not having achieved remission: Secondary | ICD-10-CM

## 2023-11-25 MED ORDER — PANTOPRAZOLE 40 MG PO TBEC
40 mg | ORAL_TABLET | Freq: Every day | ORAL | 1 refills | 90.00000 days | Status: AC
Start: 2023-11-25 — End: ?

## 2023-11-29 ENCOUNTER — Encounter: Admit: 2023-11-29 | Discharge: 2023-11-29 | Payer: MEDICARE

## 2023-11-30 ENCOUNTER — Encounter: Admit: 2023-11-30 | Discharge: 2023-11-30 | Payer: MEDICARE

## 2023-12-02 ENCOUNTER — Encounter: Admit: 2023-12-02 | Discharge: 2023-12-02 | Payer: MEDICARE

## 2023-12-03 ENCOUNTER — Encounter: Admit: 2023-12-03 | Discharge: 2023-12-03 | Payer: MEDICARE

## 2023-12-04 ENCOUNTER — Encounter: Admit: 2023-12-04 | Discharge: 2023-12-04 | Payer: MEDICARE

## 2023-12-04 MED FILL — VENETOCLAX 100 MG PO TAB: 100 mg | ORAL | 30 days supply | Qty: 120 | Fill #4 | Status: AC

## 2023-12-08 ENCOUNTER — Encounter: Admit: 2023-12-08 | Discharge: 2023-12-08 | Payer: MEDICARE

## 2023-12-08 DIAGNOSIS — C9110 Chronic lymphocytic leukemia of B-cell type not having achieved remission: Secondary | ICD-10-CM

## 2023-12-08 NOTE — Progress Notes
Name: Lindsey Brown          MRN: [id number]      DOB: 05-Dec-1956      AGE: 67 y.o.   DATE OF SERVICE: 12/08/2023    Subjective:             Reason for Visit:  Follow Up      Lindsey Brown is a 67 y.o. female.      Cancer Staging   CLL (chronic lymphocytic leukemia) (HCC)  Staging form: Chronic Lymphocytic Leukemia / Small Lymphocytic Lymphoma, AJCC 8th Edition  - Clinical stage from 02/05/2023: Modified Rai Stage III (Modified Rai risk: High, Lymphocytosis: Present, Adenopathy: Present, Organomegaly: Absent, Anemia: Present, Thrombocytopenia: Absent) - Signed by Violeta Gelinas, MD on 02/05/2023      Lindsey Brown presents today in hematologic consultation for management of CLL.    She has the following detailed history:  1.  Presented in January 2021 with palpable adenopathy for at least 2 years accompanied by peripheral blood lymphocytosis.  She was referred to Lindsey Brown.  2.  01/26/2020 bone marrow biopsy showed 60% involvement with CLL.  There was not much surface immunoglobulin, but a suggestion of mild kappa restriction.  CLL FISH panel was unremarkable.  Conventional karyotype was diploid.  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  3.  She developed worsening renal insufficiency and workup that showed no evidence of circulating paraprotein or abnormal free light chain ratios.  Her 24-hour urine revealed kappa free light chain on immunofixation.  Renal biopsy shows deposition of IgG kappa restriction, c/w injury from CLL.  4.  Peripheral blood FISH showed no abnormalities.  Peripheral blood NGS had no significant abnormalities.  She did have CloneSeq probes sent and has acceptable sequences for molecular MRD testing.  5.  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  6.  04/15/2023 peripheral blood FISH showed no abnormalities.  NGS shows no deleterious mutations.  7.  April 2024 initiated venetoclax and obinutuzumab off study.    Interim History:  I'm good.  Taking venetoclax 400mg  PO daily.  Had to have a nose biopsy and has skin cancer.  Lost a toenail due to fungal infection.  Due to see podiatry today.  Has some ongoing fatigue and   No significant nausea.  Taking pantoprazole.    I have reviewed and updated the past medical, social and family histories in the history section and they are up to date as of this visit.    I have extensively reviewed the laboratory, pathology and radiology, both internal and external, and the key findings are summarized above.         Review of Systems   All other systems reviewed and are negative.        Objective:          acyclovir (ZOVIRAX) 400 mg tablet Take one tablet by mouth every 12 hours.    ALPRAZolam (XANAX) 0.25 mg tablet Take one tablet by mouth twice daily as needed for Sleep or Anxiety.    amLODIPine (NORVASC) 10 mg tablet Take one tablet by mouth daily. Indications: high blood pressure    atorvastatin (LIPITOR) 10 mg tablet TAKE 1 TABLET BY MOUTH ONCE DAILY FOR 90 DAYS    cyanocobalamin (vitamin B-12) 500 mcg tablet Take one tablet by mouth daily.    diazePAM (VALIUM) 5 mg tablet Take  by mouth.    ergocalciferol (vitamin D2) (VITAMIN D  PO) Take 1 tablet by mouth daily.    ondansetron (ZOFRAN ODT) 4 mg rapid dissolve tablet Dissolve one tablet by mouth every 6 hours as needed. Place on tongue to dissolve.  Indications: nausea and vomiting    pantoprazole DR (PROTONIX) 40 mg tablet Take one tablet by mouth daily. In Am on an empty stomach    prochlorperazine maleate (COMPAZINE) 10 mg tablet Take one tablet by mouth every 6 hours as needed for Nausea or Vomiting.    venetoclax (VENCLEXTA) 100 mg tablet Take four tablets by mouth daily. Take with food.       Vitals:    12/08/23 1115   PainSc: Zero     There is no height or weight on file to calculate BMI.     Pain Score: Zero       Fatigue Scale: 6    Pain Addressed:  N/A    Patient Evaluated for a Clinical Trial: Patient not eligible for a treatment trial (including not needing treatment, needs palliative care, in remission).     [place] Cooperative Oncology Group performance status is 1, Restricted in physically strenuous activity but ambulatory and able to carry out work of a light or sedentary nature, e.g., light house work, office work.       Physical Exam  Vitals and nursing note reviewed.   Constitutional:       Appearance: She is well-developed.   HENT:      Head: Normocephalic.   Eyes:      General: No scleral icterus.     Conjunctiva/sclera: Conjunctivae normal.   Cardiovascular:      Rate and Rhythm: Normal rate and regular rhythm.   Pulmonary:      Effort: Pulmonary effort is normal. No respiratory distress.   Abdominal:      Palpations: Abdomen is soft.   Musculoskeletal:      Cervical back: Neck supple.      Right lower leg: No edema.      Left lower leg: No edema.   Lymphadenopathy:      Comments:   Left cervical node resolved.  Liver and spleen nonpalpable   Skin:     Findings: No rash.   Neurological:      Mental Status: She is alert.               Assessment and Plan:      CLL (chronic lymphocytic leukemia) (HCC)    Impression:  Rai Stage III CLL/SLL with normal FISH and mutated IgHV status  Chemotherapy related neutropenia  Paraneoplastic nephrotic syndrome, improving  Nonmelanoma skin cancers  Hypertension  Hyperlipidemia  ECOG PS 1    Plan:  Overall doing reasonably well.  From a response standpoint, renal function is improving, her cytopenias have resolved, and her constitutional symptoms and adenopathy remain completely cleared.  In terms of her neutropenia, this has been under significantly better control after completion of the obinutuzumab.    PEGfilgrastim as needed for ANC less than 1000.  She will get this along with her labs at the [facility] clinic for her convenience.  Continue pantoprazole 40 mg PO daily.  Nausea much better.  Continue venetoclax 400 mg PO daily.  She will take this through April 2025.  Continue acyclovir 400 mg PO twice daily  Repeat 24-hour urine and CloneSeq in April 2025.  She has been diagnosed with a non-melanoma skin cancer.  Discussed that this a common complication of CLL and she will need to continue with  close surveillance with dermatology.  She is due to see a podiatrist for a fungal infection in her foot.  There is a significant drug interaction with all azole antifungals and I discussed that she needs to avoid these.  Terbinafine does not have a drug interaction with venetoclax and this would be acceptable.  RTC with APP in 2 months, with me in 4 months, or sooner should new or concerning issues arise.

## 2023-12-21 ENCOUNTER — Encounter: Admit: 2023-12-21 | Discharge: 2023-12-21 | Payer: MEDICARE

## 2023-12-25 ENCOUNTER — Encounter: Admit: 2023-12-25 | Discharge: 2023-12-25 | Payer: MEDICARE

## 2023-12-25 DIAGNOSIS — C9110 Chronic lymphocytic leukemia of B-cell type not having achieved remission: Secondary | ICD-10-CM

## 2023-12-25 MED ORDER — VENETOCLAX 100 MG PO TAB
400 mg | ORAL_TABLET | Freq: Every day | ORAL | 3 refills | 30.00000 days | Status: AC
Start: 2023-12-25 — End: ?
  Filled 2024-01-10: qty 120, 30d supply, fill #1

## 2023-12-25 MED ORDER — VENCLEXTA 100 MG PO TAB
400 mg | ORAL_TABLET | Freq: Every day | ORAL | 3 refills | Status: CN
Start: 2023-12-25 — End: ?

## 2023-12-25 NOTE — Oral Chemotherapy Note
Name:Lindsey Brown           MRN: [id number]                 DOB:1956/11/01          Age: 68 y.o.  Date of Service: 12/25/23      Oral Chemotherapy Refill Review     Patient Information:     Correct patient/DOB/Diagnosis: Yes    Reviewed most recent clinic notes for changes in plan: Yes    Most recent labs reviewed: Yes    Appointment Information:   Date of last appointment: 12/08/23    Follow up appointment scheduled : Yes 02/09/24      Medication Information:     Medication name: Venetoclax     Medication in [place] Plan: Yes     Date of last refill released from [facility]: 09/22/23    Weight change >10%: No    Has a dose adjustment been made since last refill?No    Is Pharmacy listed in [place] treatment plan? Yes    Pharmacy: [facility]     Consent Date 08/07/23    Patient Contact  Method of communication: telephone  Confirmed need for refill: Yes  Patient taking medication as directed Yes  Confirmation of receiving pharmacy: Yes  Patient is anticipating a start date of: continuation of care     Other Information:     Plan was routed to C.Katrinka Blazing, APRN

## 2023-12-30 ENCOUNTER — Encounter: Admit: 2023-12-30 | Discharge: 2023-12-30 | Payer: MEDICARE

## 2023-12-31 ENCOUNTER — Encounter: Admit: 2023-12-31 | Discharge: 2023-12-31 | Payer: MEDICARE

## 2023-12-31 NOTE — Progress Notes
Pharmacy Benefits Investigation    Medication name: venetoclax (VENCLEXTA) 100 mg tablet  Medication status: continuation (refill)    The out of pocket cost today is $2000. This cost may change due to factors including but not limited to changes in insurance coverage.    A grant is available through [employer]. Contacted the patient to notify them the following information is needed to apply for the grant: Household Size and Income. Left voicemail asking for a return call to the specialty pharmacy at [phone]. Will follow up in 2 business days if patient has not returned call.    Phylliss Blakes  Specialty Pharmacy Patient Advocate

## 2024-01-01 ENCOUNTER — Encounter: Admit: 2024-01-01 | Discharge: 2024-01-01 | Payer: MEDICARE

## 2024-01-01 DIAGNOSIS — C9110 Chronic lymphocytic leukemia of B-cell type not having achieved remission: Secondary | ICD-10-CM

## 2024-01-01 MED ORDER — ACYCLOVIR 400 MG PO TAB
400 mg | ORAL_TABLET | Freq: Two times a day (BID) | ORAL | 0 refills | Status: AC
Start: 2024-01-01 — End: ?

## 2024-01-04 ENCOUNTER — Encounter: Admit: 2024-01-04 | Discharge: 2024-01-04 | Payer: MEDICARE

## 2024-01-04 NOTE — Progress Notes
Contacted Lindsey Brown to refill their medication(s) VENCLEXTA 100 MG PO TAB.    On MyChart medication refill questionnaire patient reported new conditions: Patient reports they have been diagnosed with [facility] cell skin cancer.     The refill was processed. Ambulatory pharmacist notified.    Sharlette Dense  Outpatient Retail Pharmacy  [phone]

## 2024-01-06 ENCOUNTER — Encounter: Admit: 2024-01-06 | Discharge: 2024-01-06 | Payer: MEDICARE

## 2024-01-08 ENCOUNTER — Encounter: Admit: 2024-01-08 | Discharge: 2024-01-08 | Payer: MEDICARE

## 2024-01-08 NOTE — Progress Notes
Pharmacy Benefits Investigation    Medication name: venetoclax (VENCLEXTA) 100 mg tablet  Medication status: continuation (refill)    The out of pocket cost today is $200. This cost may change due to factors including but not limited to changes in insurance coverage.    A grant is available through [employer]. A grant was obtained and will provide the patient with $2000 through 01/05/2025.    After assistance, the final out of pocket cost is $0.    The copay is affordable. Per patient's request, the medication will be shipped to the patient's address.    Phylliss Blakes  Specialty Pharmacy Patient Advocate

## 2024-01-10 ENCOUNTER — Encounter: Admit: 2024-01-10 | Discharge: 2024-01-10 | Payer: MEDICARE

## 2024-01-12 ENCOUNTER — Encounter: Admit: 2024-01-12 | Discharge: 2024-01-12 | Payer: MEDICARE

## 2024-01-15 ENCOUNTER — Encounter: Admit: 2024-01-15 | Discharge: 2024-01-15 | Payer: MEDICARE

## 2024-01-28 ENCOUNTER — Encounter: Admit: 2024-01-28 | Discharge: 2024-01-28 | Payer: MEDICARE

## 2024-01-29 ENCOUNTER — Encounter: Admit: 2024-01-29 | Discharge: 2024-01-29 | Payer: MEDICARE

## 2024-02-02 ENCOUNTER — Encounter: Admit: 2024-02-02 | Discharge: 2024-02-02 | Payer: MEDICARE

## 2024-02-03 ENCOUNTER — Encounter: Admit: 2024-02-03 | Discharge: 2024-02-03 | Payer: MEDICARE

## 2024-02-04 ENCOUNTER — Encounter: Admit: 2024-02-04 | Discharge: 2024-02-04 | Payer: MEDICARE

## 2024-02-05 MED FILL — VENETOCLAX 100 MG PO TAB: 100 mg | ORAL | 30 days supply | Qty: 120 | Fill #2 | Status: AC

## 2024-02-08 ENCOUNTER — Encounter: Admit: 2024-02-08 | Discharge: 2024-02-08 | Payer: MEDICARE

## 2024-02-09 ENCOUNTER — Encounter: Admit: 2024-02-09 | Discharge: 2024-02-09 | Payer: MEDICARE

## 2024-02-09 DIAGNOSIS — C9110 Chronic lymphocytic leukemia of B-cell type not having achieved remission: Secondary | ICD-10-CM

## 2024-02-23 ENCOUNTER — Encounter: Admit: 2024-02-23 | Discharge: 2024-02-23 | Payer: MEDICARE

## 2024-02-26 ENCOUNTER — Encounter: Admit: 2024-02-26 | Discharge: 2024-02-26 | Payer: MEDICARE

## 2024-02-29 ENCOUNTER — Encounter: Admit: 2024-02-29 | Discharge: 2024-02-29 | Payer: MEDICARE

## 2024-03-01 MED FILL — VENETOCLAX 100 MG PO TAB: 100 mg | ORAL | 30 days supply | Qty: 120 | Fill #3 | Status: AC

## 2024-03-09 ENCOUNTER — Encounter: Admit: 2024-03-09 | Discharge: 2024-03-09 | Payer: MEDICARE

## 2024-03-17 ENCOUNTER — Encounter: Admit: 2024-03-17 | Discharge: 2024-03-17 | Payer: MEDICARE

## 2024-03-23 ENCOUNTER — Encounter: Admit: 2024-03-23 | Discharge: 2024-03-23

## 2024-03-23 NOTE — Progress Notes
 Contacted Lindsey Brown to refill their medication(s) VENCLEXTA 100 MG PO TAB.    Patient declined a refill because they have enough medication on hand.    [facility]  Outpatient Retail Pharmacy  [phone]

## 2024-03-24 ENCOUNTER — Encounter: Admit: 2024-03-24 | Discharge: 2024-03-24

## 2024-03-24 DIAGNOSIS — C9110 Chronic lymphocytic leukemia of B-cell type not having achieved remission: Secondary | ICD-10-CM

## 2024-03-25 ENCOUNTER — Encounter: Admit: 2024-03-25 | Discharge: 2024-03-25

## 2024-03-28 ENCOUNTER — Ambulatory Visit: Admit: 2024-03-28 | Discharge: 2024-03-28

## 2024-03-28 ENCOUNTER — Encounter: Admit: 2024-03-28 | Discharge: 2024-03-28

## 2024-03-29 ENCOUNTER — Encounter: Admit: 2024-03-29 | Discharge: 2024-03-29

## 2024-03-29 DIAGNOSIS — C9110 Chronic lymphocytic leukemia of B-cell type not having achieved remission: Secondary | ICD-10-CM

## 2024-03-29 NOTE — Progress Notes
 Name: Lindsey Brown          MRN: [id number]      DOB: 03/12/1956      AGE: 68 y.o.   DATE OF SERVICE: 03/29/2024    Subjective:             Reason for Visit:  Follow Up      Lindsey Brown is a 68 y.o. female.      Cancer Staging   CLL (chronic lymphocytic leukemia) (CMS-HCC)  Staging form: Chronic Lymphocytic Leukemia / Small Lymphocytic Lymphoma, AJCC 8th Edition  - Clinical stage from 02/05/2023: Modified Rai Stage III (Modified Rai risk: High, Lymphocytosis: Present, Adenopathy: Present, Organomegaly: Absent, Anemia: Present, Thrombocytopenia: Absent) - Signed by Alicia Apa, MD on 02/05/2023      Lindsey Brown presents today in hematologic consultation for management of CLL.    She has the following detailed history:  1.  Presented in January 2021 with palpable adenopathy for at least 2 years accompanied by peripheral blood lymphocytosis.  She was referred to Dr. Tawana Fast.  2.  01/26/2020 bone marrow biopsy showed 60% involvement with CLL.  There was not much surface immunoglobulin, but a suggestion of mild kappa restriction.  CLL FISH panel was unremarkable.  Conventional karyotype was diploid.  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  3.  She developed worsening renal insufficiency and workup that showed no evidence of circulating paraprotein or abnormal free light chain ratios.  Her 24-hour urine revealed kappa free light chain on immunofixation.  Renal biopsy shows deposition of IgG kappa restriction, c/w injury from CLL.  4.  Peripheral blood FISH showed no abnormalities.  Peripheral blood NGS had no significant abnormalities.  She did have CloneSeq probes sent and has acceptable sequences for molecular MRD testing.  5.  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  6.  04/15/2023 peripheral blood FISH showed no abnormalities.  NGS shows no deleterious mutations.  7.  April 2024 initiated venetoclax and obinutuzumab off study.    Interim History:  OK  Taking venetoclax 400mg  PO daily.  Denies new or concerning adenopathy, fevers, drenching night sweats or unintentional weight loss.  No significant nausea.  Taking pantoprazole.  No interim fevers or infections.    I have reviewed and updated the past medical, social and family histories in the history section and they are up to date as of this visit.    I have extensively reviewed the laboratory, pathology and radiology, both internal and external, and the key findings are summarized above.         Review of Systems   All other systems reviewed and are negative.        Objective:          acyclovir (ZOVIRAX) 400 mg tablet TAKE 1 TABLET BY MOUTH EVERY 12 HOURS    ALPRAZolam (XANAX) 0.25 mg tablet Take one tablet by mouth twice daily as needed for Sleep or Anxiety.    amLODIPine (NORVASC) 10 mg tablet Take one tablet by mouth daily. Indications: high blood pressure    atorvastatin (LIPITOR) 10 mg tablet TAKE 1 TABLET BY MOUTH ONCE DAILY FOR 90 DAYS    cyanocobalamin (vitamin B-12) 500 mcg tablet Take one tablet by mouth daily.    ergocalciferol (vitamin D2) (VITAMIN D PO) Take 1 tablet by mouth daily.    FLUoxetine (PROZAC) 40 mg capsule Take one capsule by mouth daily.    ondansetron (  ZOFRAN ODT) 4 mg rapid dissolve tablet Dissolve one tablet by mouth every 6 hours as needed. Place on tongue to dissolve.  Indications: nausea and vomiting    pantoprazole DR (PROTONIX) 40 mg tablet Take one tablet by mouth daily. In Am on an empty stomach    prochlorperazine maleate (COMPAZINE) 10 mg tablet Take one tablet by mouth every 6 hours as needed for Nausea or Vomiting.    venetoclax (VENCLEXTA) 100 mg tablet Take four tablets by mouth daily. Take with food.       Vitals:    03/29/24 0920   BP: 124/80   BP Source: Arm, Left Upper   Pulse: 83   Temp: 36.4 ?C (97.5 ?F)   Resp: 16   SpO2: 100%   TempSrc: Temporal   PainSc: Zero   Weight: 69.6 kg (153 lb 6.4 oz)     Body mass index is 23.33 kg/m?Marland Kitchen     Pain Score: Zero       Fatigue Scale: 5    Pain Addressed:  N/A    Patient Evaluated for a Clinical Trial: Patient not eligible for a treatment trial (including not needing treatment, needs palliative care, in remission).     [place] Cooperative Oncology Group performance status is 1, Restricted in physically strenuous activity but ambulatory and able to carry out work of a light or sedentary nature, e.g., light house work, office work.       Physical Exam  Vitals and nursing note reviewed.   Constitutional:       Appearance: She is well-developed.   HENT:      Head: Normocephalic.   Eyes:      General: No scleral icterus.     Conjunctiva/sclera: Conjunctivae normal.   Cardiovascular:      Rate and Rhythm: Normal rate and regular rhythm.   Pulmonary:      Effort: Pulmonary effort is normal. No respiratory distress.   Abdominal:      Palpations: Abdomen is soft.   Musculoskeletal:      Cervical back: Neck supple.      Right lower leg: No edema.      Left lower leg: No edema.   Lymphadenopathy:      Comments:   Left cervical node resolved.  Liver and spleen nonpalpable   Skin:     Findings: No rash.   Neurological:      Mental Status: She is alert.               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    In terms of her neutropenia, this has been under significantly better control after completion of the obinutuzumab.  She has not needed a dose in some time.  Continue pantoprazole 40 mg PO daily.  Nausea much better.  Continue venetoclax 400 mg PO daily.  She has a little over a month left of medication and I recommended she finish off what she has.  Will not plan on refilling this.    Follow-up today's MRD testing by  flow cytometry.  Follow-up 24-hour urine exam.  She is due to see renal next week.  Repeat MRD testing by CloneSeq in 6 months.  Continue acyclovir 400 mg PO twice daily until she has completed the venetoclax.  She may discontinue the acyclovir after she stops venetoclax therapy.  She has been diagnosed with a non-melanoma skin cancer.  Discussed that this a common complication of CLL and she will need to continue with close surveillance with dermatology.  RTC with APP in 3 months, with me in 6 months, or sooner should new or concerning issues arise.

## 2024-04-05 ENCOUNTER — Encounter: Admit: 2024-04-05 | Discharge: 2024-04-05 | Payer: MEDICARE

## 2024-04-05 ENCOUNTER — Ambulatory Visit: Admit: 2024-04-05 | Discharge: 2024-04-06 | Payer: MEDICARE

## 2024-04-17 ENCOUNTER — Encounter: Admit: 2024-04-17 | Discharge: 2024-04-17 | Payer: MEDICARE

## 2024-04-18 ENCOUNTER — Encounter: Admit: 2024-04-18 | Discharge: 2024-04-18 | Payer: MEDICARE

## 2024-06-02 ENCOUNTER — Encounter: Admit: 2024-06-02 | Discharge: 2024-06-02 | Payer: MEDICARE

## 2024-06-03 ENCOUNTER — Encounter: Admit: 2024-06-03 | Discharge: 2024-06-03 | Payer: MEDICARE

## 2024-06-03 DIAGNOSIS — C9110 Chronic lymphocytic leukemia of B-cell type not having achieved remission: Secondary | ICD-10-CM

## 2024-06-03 DIAGNOSIS — R5383 Other fatigue: Secondary | ICD-10-CM

## 2024-06-06 ENCOUNTER — Encounter: Admit: 2024-06-06 | Discharge: 2024-06-06 | Payer: MEDICARE

## 2024-06-06 NOTE — Progress Notes
 Name: Lindsey Brown          MRN: [id number]      DOB: 29-Apr-1956      AGE: 68 y.o.   DATE OF SERVICE: 06/07/2024    Subjective:             Reason for Visit:  No chief complaint on file.      Lindsey Brown is a 68 y.o. female patient of Dr. Athena with Brown who completed obinutuzumab and 1 year of venetoclax April 2025.  She is seen today as urgent add of for labs and follow-up on fatigue.     Cancer Staging   Brown (chronic lymphocytic leukemia) (CMS-HCC)  Staging form: Chronic Lymphocytic Leukemia / Small Lymphocytic Lymphoma, AJCC 8th Edition  - Clinical stage from 02/05/2023: Modified Rai Stage III (Modified Rai risk: High, Lymphocytosis: Present, Adenopathy: Present, Organomegaly: Absent, Anemia: Present, Thrombocytopenia: Absent) - Signed by Frederic Oliva RAMAN, MD on 02/05/2023      Lindsey Brown.    She has the following detailed history:  1.  Presented in January 2021 with palpable adenopathy for at least 2 years accompanied by peripheral blood lymphocytosis.  She was referred to Dr. Creig.  2.  01/26/2020 bone marrow biopsy showed 60% involvement with Brown.  There was not much surface immunoglobulin, but a suggestion of mild kappa restriction.  Brown FISH panel was unremarkable.  Conventional karyotype was diploid.  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  3.  She developed worsening renal insufficiency and workup that showed no evidence of circulating paraprotein or abnormal free light chain ratios.  Her 24-hour urine revealed kappa free light chain on immunofixation.  Renal biopsy shows deposition of IgG kappa restriction, c/w injury from Brown.  4.  Peripheral blood FISH showed no abnormalities.  Peripheral blood NGS had no significant abnormalities.  She did have CloneSeq probes sent and has acceptable sequences for molecular MRD testing.  5.  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  6.  04/15/2023 peripheral blood FISH showed no abnormalities.  NGS shows no deleterious mutations.  7.  April 2024 initiated venetoclax and obinutuzumab off study.              Interim History 06/07/2024:  I am just so tired    Fatigue unchanged since she completed her venetoclax in April, she is very disappointed by this  Not very active, walking 20 min at a time  Sleeping 9 hour nightly, she reports that she snores  Endorses ongoing depression  Taking prozac, started 6 months ago through her PCP    Weight is stable     Up to date with colonoscopy     While she was on venetoclax she did discontinue her B12, vitamin D and multivitamin and has not resumed    She has discontinued her acyclovir    Denies nausea, vomiting, diarrhea, or constipation.  Denies fevers, chills, drenching night sweats, unintentional weight loss or new lymphadenopathy.   Denies respiratory symptoms.   No infections, transfusions, or unplanned hospitalizations in the interim.     Review of Systems    All other systems reviewed and are negative.    Objective:          ALPRAZolam (XANAX) 0.25 mg tablet Take one tablet by mouth twice daily as needed for Sleep or Anxiety.    amLODIPine (NORVASC) 10 mg tablet Take  one tablet by mouth daily. Indications: high blood pressure    atorvastatin (LIPITOR) 10 mg tablet TAKE 1 TABLET BY MOUTH ONCE DAILY FOR 90 DAYS    FLUoxetine (PROZAC) 40 mg capsule Take one capsule by mouth daily.    ondansetron (ZOFRAN ODT) 4 mg rapid dissolve tablet Dissolve one tablet by mouth every 6 hours as needed. Place on tongue to dissolve.  Indications: nausea and vomiting     Vitals:    06/07/24 1356 06/07/24 1357   BP: 121/82    BP Source: Arm, Right Upper    Pulse: 96    Temp: 36.1 ?C (97 ?F)    Resp: 15    SpO2: 100%    TempSrc: Temporal    PainSc:  Zero   Weight: 69 kg (152 lb 3.2 oz)      Body mass index is 23.15 kg/m?Lindsey Brown     Pain Score: Zero       Fatigue Scale: 8    Pain Addressed:  N/A    Patient Evaluated for a Clinical Trial: Patient not eligible for a treatment trial (including not needing treatment, needs palliative care, in remission).     [place] Cooperative Oncology Group performance status is 1, Restricted in physically strenuous activity but ambulatory and able to carry out work of a light or sedentary nature, e.g., light house work, office work.     Physical Exam  HENT:      Mouth/Throat:      Mouth: Mucous membranes are moist.      Pharynx: Oropharynx is clear.   Cardiovascular:      Rate and Rhythm: Normal rate and regular rhythm.   Pulmonary:      Breath sounds: Normal breath sounds.   Abdominal:      General: Bowel sounds are normal.      Palpations: Abdomen is soft.   Lymphadenopathy:      Comments: No cervical, axillary, or inguinal lymphadenopathy    Skin:     General: Skin is warm.   Neurological:      General: No focal deficit present.      Mental Status: She is alert.                   Assessment and Plan:  Fatigue, deconditioning.  This is unchanged since she completed her venetoclax and is likely multifactorial.  ISP, B12, folate, TSH pending today.  I will follow-up on these results.  We did discuss a sleep study as she reports she snores, she would like to wait on this.  Referred to local physical therapy for strengthening conditioning since she has completed treatment  Depression.  She started Prozac 40 mg once daily 6 months ago through her PCP.  We discussed increasing the dose and she would like to follow-up with her primary care in July  Rai Stage III Brown/SLL with normal FISH and mutated IgHV status now MRD undetected. Complete 6 cycles of obinutuzumab, and 1 year of Venetoclax in April 2025.  No palpable LAD or concerning symptoms on exam.  She does have very mild anemia, ISP pending.  She has discontinued her acyclovir  Paraneoplastic nephrotic syndrome, improving. Continues to follow with  nephrology for ongoing CKD. Follow with Dr. Tomy, planning following with protein/cr urine ratio in October   Hx [facility] requiring radiation.  Recommend she continue to follow closely with dermatology  Hypertension  Hyperlipidemia  Continue to follow closely with PCP for comorbid conditions  RTC with Dr. Frederic in 3 months or sooner should new or concerning issues arise.      Ginger Westmere, APRN-NP  Lymphoma Nurse Practitioner with Dr. Oliva Frederic, Dr. Verl Leos and Dr. Opal Daub.   Division of Hematology and Oncology  Pager [phone] or available on Voalte      Addendum:  B12 deficiency.  Recommend she restart sublingual B12 1000 mcg daily.  Patient notified via MyChart.

## 2024-06-07 ENCOUNTER — Encounter: Admit: 2024-06-07 | Discharge: 2024-06-07 | Payer: MEDICARE

## 2024-06-07 DIAGNOSIS — E538 Deficiency of other specified B group vitamins: Secondary | ICD-10-CM

## 2024-06-07 DIAGNOSIS — T732XXA Exhaustion due to exposure, initial encounter: Secondary | ICD-10-CM

## 2024-06-07 DIAGNOSIS — C9110 Chronic lymphocytic leukemia of B-cell type not having achieved remission: Secondary | ICD-10-CM

## 2024-06-07 DIAGNOSIS — R531 Weakness: Secondary | ICD-10-CM

## 2024-06-07 MED ORDER — CYANOCOBALAMIN (VITAMIN B-12) 1,000 MCG PO TAB
1000 ug | Freq: Every day | ORAL | 0 refills | 29.00000 days | Status: AC
Start: 2024-06-07 — End: ?

## 2024-06-07 NOTE — Progress Notes
 Specialty Medication Discontinuation of Treatment    Medication name: VENETOCLAX PO    Spoke with clinic and the patient is no longer taking their medication due to therapy being complete. Thus, the patient will be removed from the specialty pharmacy patient management program.    The medication has been removed from the patient's active medication list. Should the patient's care team wish to restart the medication, a new prescription will need to be sent to [facility].    Yitzel Shasteen, PHARMD

## 2024-06-13 ENCOUNTER — Encounter: Admit: 2024-06-13 | Discharge: 2024-06-13 | Payer: MEDICARE

## 2024-09-23 ENCOUNTER — Encounter: Admit: 2024-09-23 | Discharge: 2024-09-23 | Payer: MEDICARE

## 2024-09-26 ENCOUNTER — Encounter: Admit: 2024-09-26 | Discharge: 2024-09-26 | Payer: MEDICARE

## 2024-09-27 ENCOUNTER — Encounter: Admit: 2024-09-27 | Discharge: 2024-09-27 | Payer: MEDICARE

## 2024-09-27 VITALS — BP 112/70 | HR 84 | Temp 97.80000°F | Resp 16 | Wt 155.4 lb

## 2024-09-27 DIAGNOSIS — C9110 Chronic lymphocytic leukemia of B-cell type not having achieved remission: Principal | ICD-10-CM

## 2024-09-27 NOTE — Progress Notes
 Name: Lindsey Brown          MRN: [id number]      DOB: 1956/12/19      AGE: 68 y.o.   DATE OF SERVICE: 09/27/2024    Subjective:             Reason for Visit:  Follow Up      Lindsey Brown is a 68 y.o. female.      Cancer Staging   CLL (chronic lymphocytic leukemia) (CMS-HCC)  Staging form: Chronic Lymphocytic Leukemia / Small Lymphocytic Lymphoma, AJCC 8th Edition  - Clinical stage from 02/05/2023: Modified Rai Stage III (Modified Rai risk: High, Lymphocytosis: Present, Adenopathy: Present, Organomegaly: Absent, Anemia: Present, Thrombocytopenia: Absent) - Signed by Frederic Oliva RAMAN, MD on 02/05/2023      Lindsey Brown presents today in hematologic consultation for management of CLL.    She has the following detailed history:  1.  Presented in January 2021 with palpable adenopathy for at least 2 years accompanied by peripheral blood lymphocytosis.  She was referred to Dr. Creig.  2.  01/26/2020 bone marrow biopsy showed 60% involvement with CLL.  There was not much surface immunoglobulin, but a suggestion of mild kappa restriction.  CLL FISH panel was unremarkable.  Conventional karyotype was diploid.  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  3.  She developed worsening renal insufficiency and workup that showed no evidence of circulating paraprotein or abnormal free light chain ratios.  Her 24-hour urine revealed kappa free light chain on immunofixation.  Renal biopsy shows deposition of IgG kappa restriction, c/w injury from CLL.  4.  Peripheral blood FISH showed no abnormalities.  Peripheral blood NGS had no significant abnormalities.  She did have CloneSeq probes sent and has acceptable sequences for molecular MRD testing.  5.  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  6.  04/15/2023 peripheral blood FISH showed no abnormalities.  NGS shows no deleterious mutations.  7.  April 2024 initiated venetoclax  and obinutuzumab  off study.  She completed therapy April 2025 with a CR with uMRD4 by peripheral blood flow cytometry    Interim History:  No problems lately.  Has been off venetoclax  for 6 months.  Still with some fatigue.  Denies new or concerning adenopathy, fevers, drenching night sweats or unintentional weight loss.  No interim fevers or infections.    I have reviewed and updated the past medical, social and family histories in the history section and they are up to date as of this visit.    I have extensively reviewed the laboratory, pathology and radiology, both internal and external, and the key findings are summarized above.         Review of Systems   All other systems reviewed and are negative.        Objective:          ALPRAZolam  (XANAX ) 0.25 mg tablet Take one tablet by mouth twice daily as needed for Sleep or Anxiety.    amLODIPine  (NORVASC ) 10 mg tablet Take one tablet by mouth daily. Indications: high blood pressure    atorvastatin  (LIPITOR) 10 mg tablet TAKE 1 TABLET BY MOUTH ONCE DAILY FOR 90 DAYS    cyanocobalamin  (vitamin B-12) (VITAMIN B-12) 1,000 mcg tablet Take one tablet by mouth daily.    FLUoxetine (PROZAC) 40 mg capsule Take one capsule by mouth daily.     Vitals:    09/27/24 1114   BP: 112/70  BP Source: Arm, Left Upper   Pulse: 84   Temp: 36.6 ?C (97.8 ?F)   Resp: 16   SpO2: 100%   TempSrc: Temporal   PainSc: Zero   Weight: 70.5 kg (155 lb 6.4 oz)     Body mass index is 23.63 kg/m?SABRA     Pain Score: Zero       Fatigue Scale: 0-None    Pain Addressed:  N/A    Patient Evaluated for a Clinical Trial: Patient not eligible for a treatment trial (including not needing treatment, needs palliative care, in remission).     [place] Cooperative Oncology Group performance status is 1, Restricted in physically strenuous activity but ambulatory and able to carry out work of a light or sedentary nature, e.g., light house work, office work       Physical Exam  Vitals and nursing note reviewed. Constitutional:       Appearance: She is well-developed.   HENT:      Head: Normocephalic.   Eyes:      General: No scleral icterus.     Conjunctiva/sclera: Conjunctivae normal.   Cardiovascular:      Rate and Rhythm: Normal rate.   Pulmonary:      Effort: Pulmonary effort is normal.   Abdominal:      Palpations: Abdomen is soft.   Musculoskeletal:         General: Normal range of motion.      Cervical back: Neck supple.   Lymphadenopathy:      Comments:   Left cervical node resolved.  Liver and spleen nonpalpable   Skin:     Findings: No rash.   Neurological:      Mental Status: She is alert.               Assessment and Plan:      CLL (chronic lymphocytic leukemia) (CMS-HCC)    Impression:  Rai Stage III CLL/SLL with normal FISH and mutated IgHV status  Chemotherapy related neutropenia  Paraneoplastic nephrotic syndrome, improving  Nonmelanoma skin cancers  Hypertension  Hyperlipidemia  ECOG PS 1    Plan:  Clinically she is doing extraordinarily well.  CLL remains in a complete clinical remission, renal function continues to improve, and she has no worrisome symptoms today.    Continue active surveillance.  She has MRD testing from today by CloneSeq and flow cytometry that we will follow-up with interest.  Continue surveillance with dermatology for nonmelanoma skin cancers.  RTC with APP in 3 months, with me in 6 months, or sooner should new or concerning issues arise.

## 2024-10-04 ENCOUNTER — Encounter: Admit: 2024-10-04 | Discharge: 2024-10-04 | Payer: MEDICARE

## 2024-10-19 ENCOUNTER — Encounter: Admit: 2024-10-19 | Discharge: 2024-10-19 | Payer: MEDICARE

## 2024-12-20 NOTE — Progress Notes [1]
 Name: Lindsey Brown          MRN: [id number]      DOB: October 13, 1956      AGE: 68 y.o.   DATE OF SERVICE: 12/27/2024    Subjective:             Reason for Visit:  Cancer and Follow Up      Lindsey Brown is a 68 y.o. female patient of Dr. Athena with CLL who completed obinutuzumab  and 1 year of venetoclax  April 2025.  She is seen today labs and follow-up     Cancer Staging   CLL (chronic lymphocytic leukemia) (CMS-HCC)  Staging form: Chronic Lymphocytic Leukemia / Small Lymphocytic Lymphoma, AJCC 8th Edition  - Clinical stage from 02/05/2023: Modified Rai Stage III (Modified Rai risk: High, Lymphocytosis: Present, Adenopathy: Present, Organomegaly: Absent, Anemia: Present, Thrombocytopenia: Absent) - Signed by Frederic Oliva RAMAN, MD on 02/05/2023      Lindsey Brown presents today in hematologic consultation for management of CLL.    She has the following detailed history:  1.  Presented in January 2021 with palpable adenopathy for at least 2 years accompanied by peripheral blood lymphocytosis.  She was referred to Dr. Creig.  2.  01/26/2020 bone marrow biopsy showed 60% involvement with CLL.  There was not much surface immunoglobulin, but a suggestion of mild kappa restriction.  CLL FISH panel was unremarkable.  Conventional karyotype was diploid.  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  3.  She developed worsening renal insufficiency and workup that showed no evidence of circulating paraprotein or abnormal free light chain ratios.  Her 24-hour urine revealed kappa free light chain on immunofixation.  Renal biopsy shows deposition of IgG kappa restriction, c/w injury from CLL.  4.  Peripheral blood FISH showed no abnormalities.  Peripheral blood NGS had no significant abnormalities.  She did have CloneSeq probes sent and has acceptable sequences for molecular MRD testing.  5.  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  6.  04/15/2023 peripheral blood FISH showed no abnormalities.  NGS shows no deleterious mutations.  7.  April 2024 initiated venetoclax  and obinutuzumab  off study.              Interim History:  I feel better    Fatigue much better, she is working to increase her activity    Depression symptoms resolved on Prozac    Weight is stable, denies urinary or abdominal symptoms  Denies nausea, vomiting, diarrhea, or constipation.  Denies fevers, chills, drenching night sweats, unintentional weight loss or new lymphadenopathy.   Denies respiratory symptoms.   No infections, transfusions, or unplanned hospitalizations in the interim.    Lindsey Brown is seen with her husband today     Review of Systems    All other systems reviewed and are negative.    Objective:          ALPRAZolam  (XANAX ) 0.25 mg tablet Take one tablet by mouth twice daily as needed for Sleep or Anxiety.    amLODIPine  (NORVASC ) 10 mg tablet Take one tablet by mouth daily. Indications: high blood pressure    atorvastatin  (LIPITOR) 10 mg tablet TAKE 1 TABLET BY MOUTH ONCE DAILY FOR 90 DAYS    cyanocobalamin  (vitamin B-12) (VITAMIN B-12) 1,000 mcg tablet Take one tablet by mouth daily.    FLUoxetine (PROZAC) 40 mg capsule Take one capsule by mouth daily.     Vitals:    12/27/24 1041  BP: 115/70   BP Source: Arm, Left Upper   Pulse: 84   Temp: 36.4 ?C (97.6 ?F)   Resp: 16   SpO2: 98%   TempSrc: Temporal   PainSc: Zero   Weight: 72.8 kg (160 lb 6.4 oz)       Body mass index is 24.39 kg/m?SABRA     Pain Score: Zero       Fatigue Scale: 0-None    Pain Addressed:  N/A    Patient Evaluated for a Clinical Trial: Patient not eligible for a treatment trial (including not needing treatment, needs palliative care, in remission).     Eastern Cooperative Oncology Group performance status is 1, Restricted in physically strenuous activity but ambulatory and able to carry out work of a light or sedentary nature, e.g., light house work, office work.     Physical Exam  HENT:      Mouth/Throat:      Mouth: Mucous membranes are moist.      Pharynx: Oropharynx is clear.   Cardiovascular:      Rate and Rhythm: Normal rate and regular rhythm.   Pulmonary:      Breath sounds: Normal breath sounds.   Abdominal:      General: Bowel sounds are normal.      Palpations: Abdomen is soft.   Lymphadenopathy:      Comments: No cervical, axillary, or inguinal lymphadenopathy    Skin:     General: Skin is warm.   Neurological:      General: No focal deficit present.      Mental Status: She is alert.                   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  She will continue active surveillance  Continue oral B12 OTC   Continue to follow with dermatology for nonmelanoma skin cancers.  Recommend she stay up-to-date with her PCP.  She is due for mammogram  She is up-to-date with age-appropriate vaccinations         RTC with Dr. Frederic in 3 months or sooner should new or concerning issues arise.      Lindsey Worthington, APRN-NP  Lymphoma Nurse Practitioner with Dr. Oliva Frederic, Dr. Verl Leos and Dr. Opal Daub.   Division of Hematology and Oncology  Pager [phone] or available on Voalte

## 2024-12-26 ENCOUNTER — Encounter: Admit: 2024-12-26 | Discharge: 2024-12-26 | Payer: MEDICARE

## 2024-12-27 ENCOUNTER — Encounter: Admit: 2024-12-27 | Discharge: 2024-12-27 | Payer: MEDICARE

## 2024-12-27 VITALS — BP 115/70 | HR 84 | Temp 97.60000°F | Resp 16 | Wt 160.4 lb

## 2024-12-27 DIAGNOSIS — C9110 Chronic lymphocytic leukemia of B-cell type not having achieved remission: Principal | ICD-10-CM
